# Patient Record
Sex: Male | Born: 1970 | Race: White | Hispanic: No | Marital: Married | Smoking: Current every day smoker
Health system: Southern US, Community
[De-identification: ages and names within clinical notes are randomized; demographics above are authoritative.]

## PROBLEM LIST (undated history)

## (undated) ENCOUNTER — Emergency Department (HOSPITAL_BASED_OUTPATIENT_CLINIC_OR_DEPARTMENT_OTHER)

## (undated) DIAGNOSIS — I509 Heart failure, unspecified: Secondary | ICD-10-CM

## (undated) DIAGNOSIS — K7460 Unspecified cirrhosis of liver: Secondary | ICD-10-CM

## (undated) DIAGNOSIS — E7800 Pure hypercholesterolemia, unspecified: Secondary | ICD-10-CM

## (undated) HISTORY — PX: TONSILLECTOMY: SUR1361

---

## 1998-08-09 ENCOUNTER — Emergency Department (HOSPITAL_COMMUNITY): Admission: EM | Admit: 1998-08-09 | Discharge: 1998-08-09 | Payer: Self-pay | Admitting: Emergency Medicine

## 1999-05-09 ENCOUNTER — Emergency Department (HOSPITAL_COMMUNITY): Admission: EM | Admit: 1999-05-09 | Discharge: 1999-05-09 | Payer: Self-pay | Admitting: Emergency Medicine

## 2001-10-11 ENCOUNTER — Encounter: Payer: Self-pay | Admitting: Family Medicine

## 2001-10-11 ENCOUNTER — Ambulatory Visit (HOSPITAL_COMMUNITY): Admission: RE | Admit: 2001-10-11 | Discharge: 2001-10-11 | Payer: Self-pay | Admitting: Family Medicine

## 2009-11-08 ENCOUNTER — Ambulatory Visit: Payer: Self-pay | Admitting: General Practice

## 2010-12-21 ENCOUNTER — Ambulatory Visit: Payer: Self-pay | Admitting: General Practice

## 2011-08-19 IMAGING — CR DG CHEST 2V
1 series · 2 of 2 positions shown · non-contrast
Comparison: none

REASON FOR EXAM: respiratory clearance physical include report to [REDACTED] fax [phone]
COMMENTS:

PROCEDURE:     DXR - DXR CHEST 2 VIEWS NORIKO NAGEL  - November 08, 2009 [DATE]
RESULT:     The lung fields are clear. No pneumonia, pneumothorax or pleural
effusion is seen. The heart and pulmonary vasculature are normal in
appearance. The mediastinal and osseous structures show no acute changes.

[Series 1: view not recorded · 0.17mm/px · 2 of 2 slices shown]
[im 1/2]
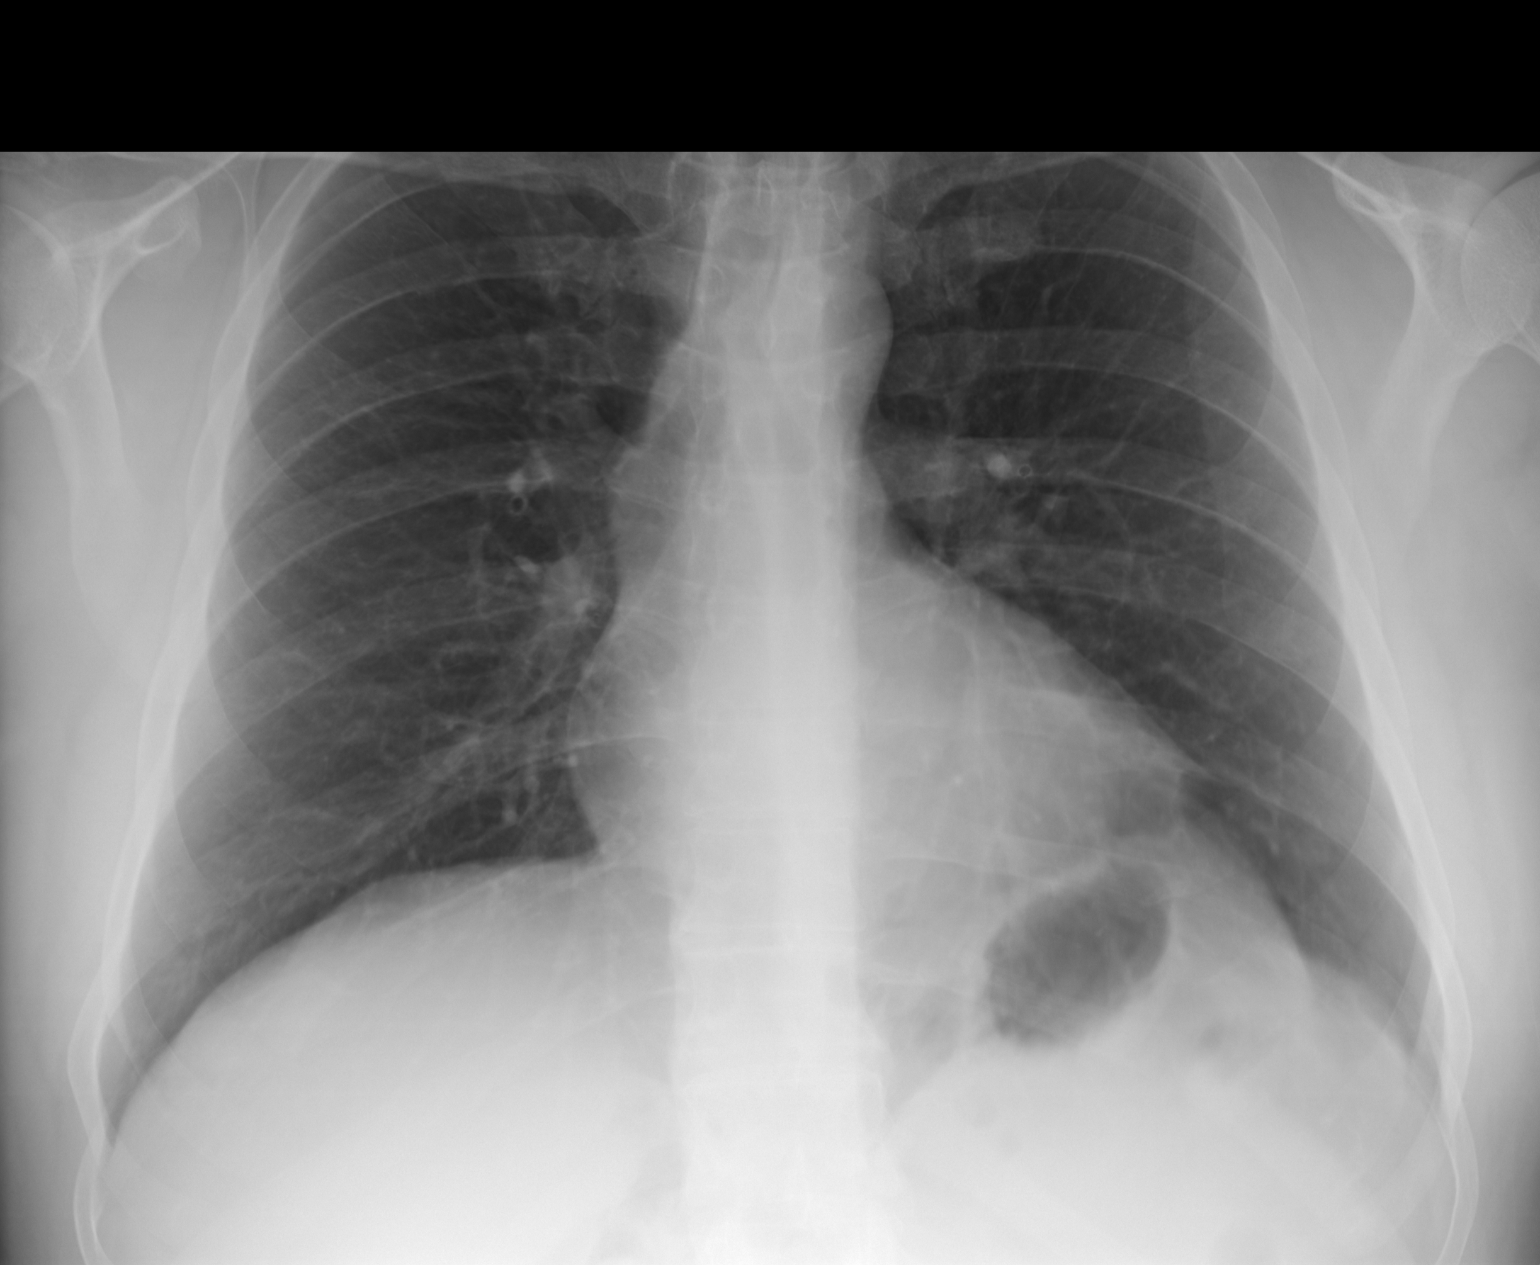
[im 2/2]
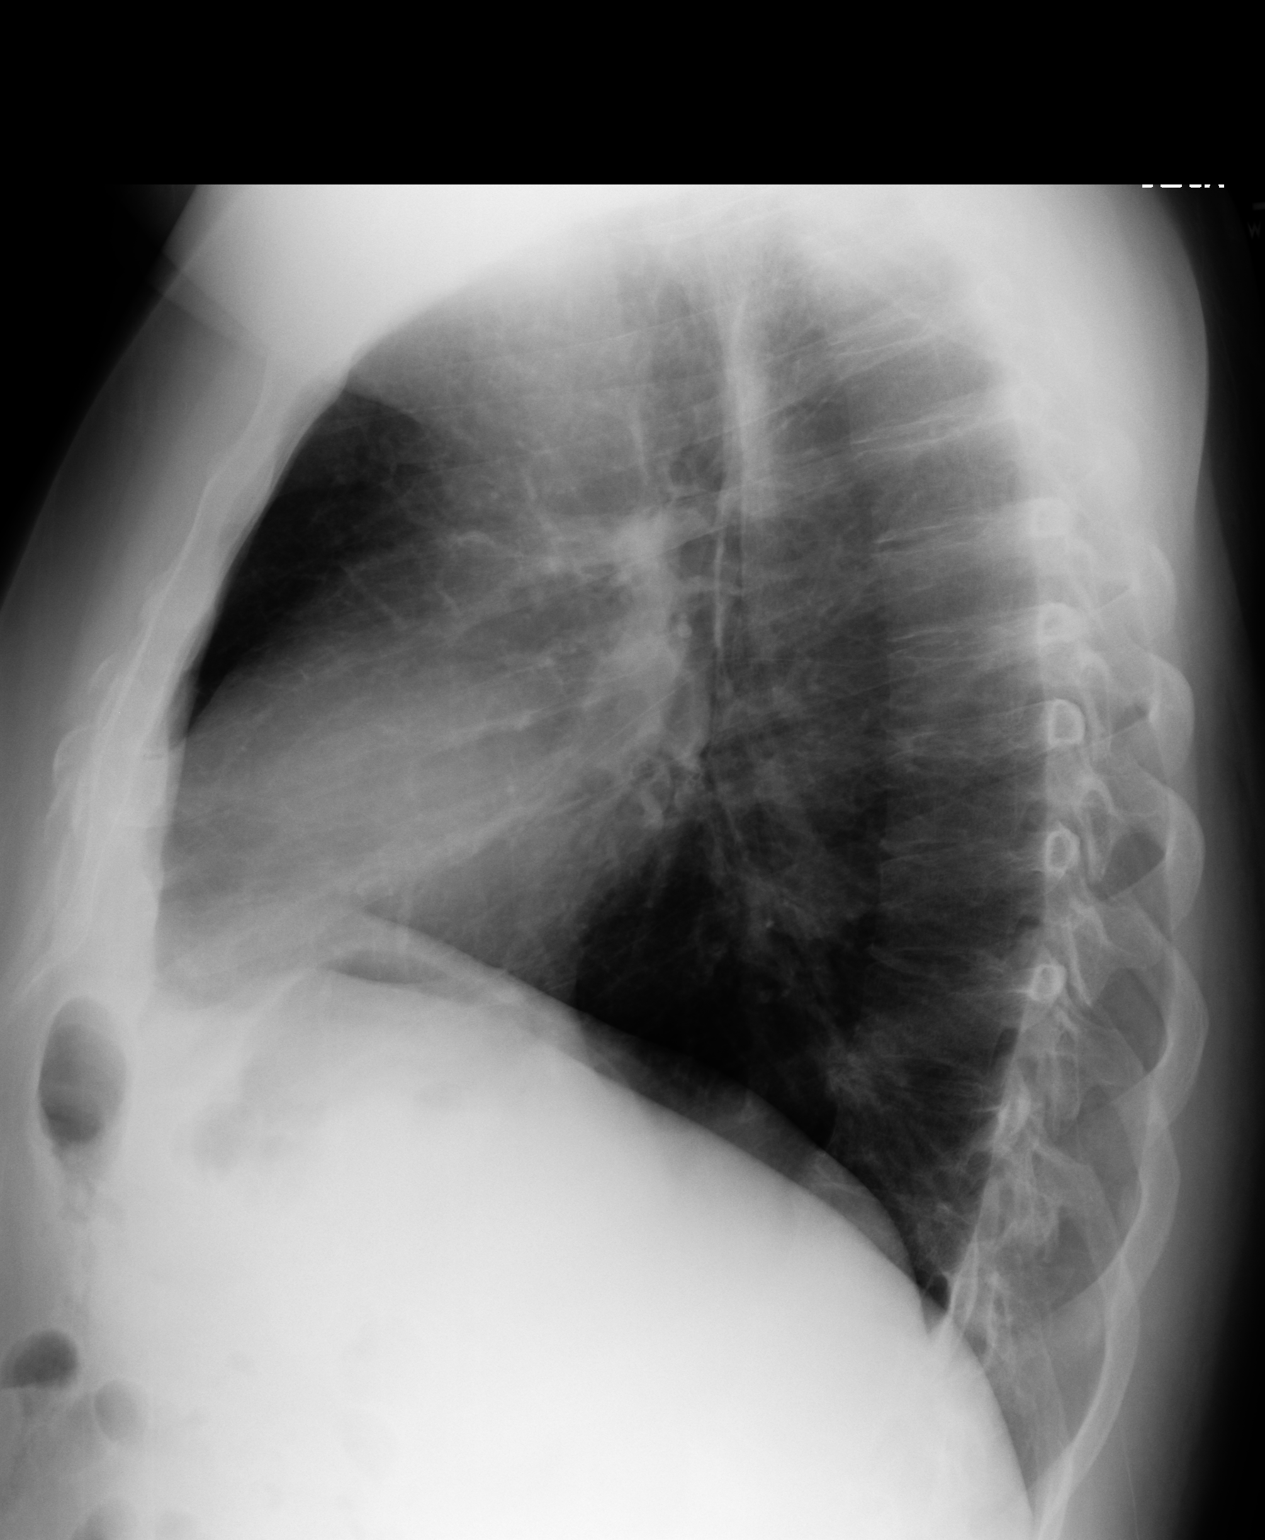

[2 of 2 positions shown; findings below may reference images not displayed]

IMPRESSION: 1.     No significant abnormalities are identified.

## 2014-03-04 ENCOUNTER — Ambulatory Visit: Payer: Self-pay | Admitting: General Practice

## 2021-02-19 ENCOUNTER — Inpatient Hospital Stay (HOSPITAL_COMMUNITY)
Admission: EM | Admit: 2021-02-19 | Discharge: 2021-03-01 | DRG: 896 | Disposition: A | Discharge status: Rehabilitation Facility | Payer: Self-pay | Attending: Internal Medicine | Admitting: Internal Medicine

## 2021-02-19 ENCOUNTER — Inpatient Hospital Stay (HOSPITAL_COMMUNITY): Payer: Self-pay

## 2021-02-19 ENCOUNTER — Other Ambulatory Visit: Payer: Self-pay

## 2021-02-19 ENCOUNTER — Emergency Department (HOSPITAL_COMMUNITY): Payer: Self-pay

## 2021-02-19 ENCOUNTER — Encounter (HOSPITAL_COMMUNITY): Payer: Self-pay

## 2021-02-19 DIAGNOSIS — W19XXXA Unspecified fall, initial encounter: Secondary | ICD-10-CM

## 2021-02-19 DIAGNOSIS — K5909 Other constipation: Secondary | ICD-10-CM | POA: Diagnosis present

## 2021-02-19 DIAGNOSIS — I161 Hypertensive emergency: Secondary | ICD-10-CM | POA: Diagnosis present

## 2021-02-19 DIAGNOSIS — Z6841 Body Mass Index (BMI) 40.0 and over, adult: Secondary | ICD-10-CM

## 2021-02-19 DIAGNOSIS — J9601 Acute respiratory failure with hypoxia: Secondary | ICD-10-CM | POA: Diagnosis present

## 2021-02-19 DIAGNOSIS — I10 Essential (primary) hypertension: Secondary | ICD-10-CM

## 2021-02-19 DIAGNOSIS — F17210 Nicotine dependence, cigarettes, uncomplicated: Secondary | ICD-10-CM | POA: Diagnosis present

## 2021-02-19 DIAGNOSIS — I110 Hypertensive heart disease with heart failure: Secondary | ICD-10-CM | POA: Diagnosis present

## 2021-02-19 DIAGNOSIS — R0602 Shortness of breath: Secondary | ICD-10-CM

## 2021-02-19 DIAGNOSIS — W108XXD Fall (on) (from) other stairs and steps, subsequent encounter: Secondary | ICD-10-CM

## 2021-02-19 DIAGNOSIS — F10231 Alcohol dependence with withdrawal delirium: Principal | ICD-10-CM | POA: Diagnosis present

## 2021-02-19 DIAGNOSIS — E519 Thiamine deficiency, unspecified: Secondary | ICD-10-CM | POA: Diagnosis present

## 2021-02-19 DIAGNOSIS — R296 Repeated falls: Secondary | ICD-10-CM | POA: Diagnosis present

## 2021-02-19 DIAGNOSIS — G589 Mononeuropathy, unspecified: Secondary | ICD-10-CM | POA: Diagnosis present

## 2021-02-19 DIAGNOSIS — K7460 Unspecified cirrhosis of liver: Secondary | ICD-10-CM

## 2021-02-19 DIAGNOSIS — R609 Edema, unspecified: Secondary | ICD-10-CM

## 2021-02-19 DIAGNOSIS — E222 Syndrome of inappropriate secretion of antidiuretic hormone: Secondary | ICD-10-CM | POA: Diagnosis present

## 2021-02-19 DIAGNOSIS — E669 Obesity, unspecified: Secondary | ICD-10-CM | POA: Diagnosis present

## 2021-02-19 DIAGNOSIS — Z8249 Family history of ischemic heart disease and other diseases of the circulatory system: Secondary | ICD-10-CM

## 2021-02-19 DIAGNOSIS — Y92009 Unspecified place in unspecified non-institutional (private) residence as the place of occurrence of the external cause: Secondary | ICD-10-CM

## 2021-02-19 DIAGNOSIS — R778 Other specified abnormalities of plasma proteins: Secondary | ICD-10-CM | POA: Diagnosis present

## 2021-02-19 DIAGNOSIS — D6959 Other secondary thrombocytopenia: Secondary | ICD-10-CM | POA: Diagnosis present

## 2021-02-19 DIAGNOSIS — I5043 Acute on chronic combined systolic (congestive) and diastolic (congestive) heart failure: Secondary | ICD-10-CM | POA: Diagnosis present

## 2021-02-19 DIAGNOSIS — I160 Hypertensive urgency: Secondary | ICD-10-CM | POA: Diagnosis present

## 2021-02-19 DIAGNOSIS — R7989 Other specified abnormal findings of blood chemistry: Secondary | ICD-10-CM | POA: Diagnosis present

## 2021-02-19 DIAGNOSIS — E860 Dehydration: Secondary | ICD-10-CM | POA: Diagnosis present

## 2021-02-19 DIAGNOSIS — G312 Degeneration of nervous system due to alcohol: Secondary | ICD-10-CM | POA: Diagnosis present

## 2021-02-19 DIAGNOSIS — M109 Gout, unspecified: Secondary | ICD-10-CM | POA: Diagnosis present

## 2021-02-19 DIAGNOSIS — K7030 Alcoholic cirrhosis of liver without ascites: Secondary | ICD-10-CM | POA: Diagnosis present

## 2021-02-19 DIAGNOSIS — F419 Anxiety disorder, unspecified: Secondary | ICD-10-CM | POA: Diagnosis present

## 2021-02-19 DIAGNOSIS — Z20822 Contact with and (suspected) exposure to covid-19: Secondary | ICD-10-CM | POA: Diagnosis present

## 2021-02-19 DIAGNOSIS — M25522 Pain in left elbow: Secondary | ICD-10-CM

## 2021-02-19 DIAGNOSIS — E538 Deficiency of other specified B group vitamins: Secondary | ICD-10-CM | POA: Diagnosis present

## 2021-02-19 HISTORY — DX: Nicotine dependence, cigarettes, uncomplicated: F17.210

## 2021-02-19 HISTORY — DX: Essential (primary) hypertension: I10

## 2021-02-19 LAB — VITAMIN B12: Vitamin B-12: 259 pg/mL (ref 180–914)

## 2021-02-19 LAB — CBC
HCT: 43.6 % (ref 39.0–52.0)
Hemoglobin: 14.8 g/dL (ref 13.0–17.0)
MCH: 35.2 pg — ABNORMAL HIGH (ref 26.0–34.0)
MCHC: 33.9 g/dL (ref 30.0–36.0)
MCV: 103.8 fL — ABNORMAL HIGH (ref 80.0–100.0)
Platelets: 146 10*3/uL — ABNORMAL LOW (ref 150–400)
RBC: 4.2 MIL/uL — ABNORMAL LOW (ref 4.22–5.81)
RDW: 12.5 % (ref 11.5–15.5)
WBC: 8.9 10*3/uL (ref 4.0–10.5)
nRBC: 0 % (ref 0.0–0.2)

## 2021-02-19 LAB — BASIC METABOLIC PANEL
Anion gap: 11 (ref 5–15)
BUN: 6 mg/dL (ref 6–20)
CO2: 28 mmol/L (ref 22–32)
Calcium: 9.6 mg/dL (ref 8.9–10.3)
Chloride: 91 mmol/L — ABNORMAL LOW (ref 98–111)
Creatinine, Ser: 0.68 mg/dL (ref 0.61–1.24)
GFR, Estimated: >60 mL/min (ref >60)
Glucose, Bld: 97 mg/dL (ref 70–99)
Potassium: 4.5 mmol/L (ref 3.5–5.1)
Sodium: 130 mmol/L — ABNORMAL LOW (ref 135–145)

## 2021-02-19 LAB — FOLATE: Folate: 5.5 ng/mL — ABNORMAL LOW (ref >5.9)

## 2021-02-19 LAB — TSH: TSH: 3.067 u[IU]/mL (ref 0.350–4.500)

## 2021-02-19 LAB — HEPATIC FUNCTION PANEL
ALT: 29 U/L (ref 0–44)
AST: 27 U/L (ref 15–41)
Albumin: 3.9 g/dL (ref 3.5–5.0)
Alkaline Phosphatase: 52 U/L (ref 38–126)
Bilirubin, Direct: 0.3 mg/dL — ABNORMAL HIGH (ref 0.0–0.2)
Indirect Bilirubin: 0.6 mg/dL (ref 0.3–0.9)
Total Bilirubin: 0.9 mg/dL (ref 0.3–1.2)
Total Protein: 6.4 g/dL — ABNORMAL LOW (ref 6.5–8.1)

## 2021-02-19 LAB — TROPONIN I (HIGH SENSITIVITY)
Troponin I (High Sensitivity): 25 ng/L — ABNORMAL HIGH (ref <18)
Troponin I (High Sensitivity): 22 ng/L — ABNORMAL HIGH (ref <18)

## 2021-02-19 LAB — RESP PANEL BY RT-PCR (FLU A&B, COVID) ARPGX2
Influenza A by PCR: NEGATIVE
Influenza B by PCR: NEGATIVE
SARS Coronavirus 2 by RT PCR: NEGATIVE

## 2021-02-19 LAB — SEDIMENTATION RATE: Sed Rate: 9 mm/hr (ref 0–16)

## 2021-02-19 LAB — PROTIME-INR
INR: 1 (ref 0.8–1.2)
Prothrombin Time: 13.4 seconds (ref 11.4–15.2)

## 2021-02-19 LAB — LACTIC ACID, PLASMA: Lactic Acid, Venous: 0.7 mmol/L (ref 0.5–1.9)

## 2021-02-19 LAB — APTT: aPTT: 28 seconds (ref 24–36)

## 2021-02-19 LAB — AMMONIA: Ammonia: 30 umol/L (ref 9–35)

## 2021-02-19 LAB — BRAIN NATRIURETIC PEPTIDE: B Natriuretic Peptide: 698 pg/mL — ABNORMAL HIGH (ref 0.0–100.0)

## 2021-02-19 MED ORDER — THIAMINE HCL 100 MG/ML IJ SOLN
100.0000 mg | Freq: Every day | INTRAMUSCULAR | Status: DC
  Administered 2021-02-19: 100 mg via INTRAVENOUS
  Filled 2021-02-19: qty 2

## 2021-02-19 MED ORDER — LISINOPRIL 10 MG PO TABS
20.0000 mg | ORAL_TABLET | Freq: Every day | ORAL | Status: DC
  Administered 2021-02-20: 20 mg via ORAL
  Filled 2021-02-19: qty 2

## 2021-02-19 MED ORDER — NICOTINE 21 MG/24HR TD PT24
21.0000 mg | MEDICATED_PATCH | Freq: Every day | TRANSDERMAL | Status: DC
  Administered 2021-02-20 – 2021-03-01 (×10): 21 mg via TRANSDERMAL
  Filled 2021-02-19 (×10): qty 1

## 2021-02-19 MED ORDER — ADULT MULTIVITAMIN W/MINERALS CH
1.0000 | ORAL_TABLET | Freq: Every day | ORAL | Status: DC
  Administered 2021-02-20 – 2021-03-01 (×10): 1 via ORAL
  Filled 2021-02-19 (×10): qty 1

## 2021-02-19 MED ORDER — THIAMINE HCL 100 MG/ML IJ SOLN: 100.0000 mg | Freq: Every day | INTRAMUSCULAR | Status: DC

## 2021-02-19 MED ORDER — PANTOPRAZOLE SODIUM 40 MG PO TBEC
40.0000 mg | DELAYED_RELEASE_TABLET | Freq: Every day | ORAL | Status: DC
  Administered 2021-02-20 – 2021-03-01 (×10): 40 mg via ORAL
  Filled 2021-02-19 (×10): qty 1

## 2021-02-19 MED ORDER — MORPHINE SULFATE (PF) 4 MG/ML IV SOLN
6.0000 mg | Freq: Once | INTRAVENOUS | Status: AC
  Administered 2021-02-19: 6 mg via INTRAVENOUS
  Filled 2021-02-19: qty 2

## 2021-02-19 MED ORDER — LORAZEPAM 2 MG/ML IJ SOLN: 2.0000 mg | Freq: Once | INTRAMUSCULAR | Status: AC

## 2021-02-19 MED ORDER — LORAZEPAM 2 MG/ML IJ SOLN
INTRAMUSCULAR | Status: AC
  Administered 2021-02-19: 2 mg via INTRAVENOUS
  Filled 2021-02-19: qty 1

## 2021-02-19 MED ORDER — LORAZEPAM 2 MG/ML IJ SOLN
1.0000 mg | INTRAMUSCULAR | Status: AC | PRN
  Administered 2021-02-20 (×2): 2 mg via INTRAVENOUS
  Administered 2021-02-20: 3 mg via INTRAVENOUS
  Administered 2021-02-20: 2 mg via INTRAVENOUS
  Administered 2021-02-20: 4 mg via INTRAVENOUS
  Administered 2021-02-21: 1 mg via INTRAVENOUS
  Administered 2021-02-21 – 2021-02-22 (×4): 2 mg via INTRAVENOUS
  Filled 2021-02-19 (×3): qty 1
  Filled 2021-02-19: qty 2
  Filled 2021-02-19 (×4): qty 1

## 2021-02-19 MED ORDER — ENOXAPARIN SODIUM 40 MG/0.4ML IJ SOSY
40.0000 mg | PREFILLED_SYRINGE | INTRAMUSCULAR | Status: DC
  Administered 2021-02-19 – 2021-02-20 (×2): 40 mg via SUBCUTANEOUS
  Filled 2021-02-19 (×2): qty 0.4

## 2021-02-19 MED ORDER — ONDANSETRON HCL 4 MG PO TABS: 4.0000 mg | ORAL_TABLET | Freq: Four times a day (QID) | ORAL | Status: DC | PRN

## 2021-02-19 MED ORDER — FUROSEMIDE 10 MG/ML IJ SOLN
40.0000 mg | Freq: Two times a day (BID) | INTRAMUSCULAR | Status: DC
  Administered 2021-02-19 – 2021-02-21 (×4): 40 mg via INTRAVENOUS
  Filled 2021-02-19 (×4): qty 4

## 2021-02-19 MED ORDER — FUROSEMIDE 10 MG/ML IJ SOLN
40.0000 mg | Freq: Once | INTRAMUSCULAR | Status: AC
  Administered 2021-02-19: 40 mg via INTRAVENOUS
  Filled 2021-02-19: qty 4

## 2021-02-19 MED ORDER — THIAMINE HCL 100 MG PO TABS: 100.0000 mg | ORAL_TABLET | Freq: Every day | ORAL | Status: DC

## 2021-02-19 MED ORDER — LORAZEPAM 2 MG/ML IJ SOLN
2.0000 mg | Freq: Once | INTRAMUSCULAR | Status: AC
  Administered 2021-02-19: 2 mg via INTRAVENOUS
  Filled 2021-02-19: qty 1

## 2021-02-19 MED ORDER — LORAZEPAM 2 MG/ML IJ SOLN
0.0000 mg | Freq: Two times a day (BID) | INTRAMUSCULAR | Status: AC
  Administered 2021-02-21 – 2021-02-22 (×2): 2 mg via INTRAVENOUS
  Administered 2021-02-22: 3 mg via INTRAVENOUS
  Administered 2021-02-23: 2 mg via INTRAVENOUS
  Filled 2021-02-19 (×5): qty 1

## 2021-02-19 MED ORDER — AMLODIPINE BESYLATE 5 MG PO TABS
5.0000 mg | ORAL_TABLET | Freq: Every day | ORAL | Status: DC
  Filled 2021-02-19: qty 1

## 2021-02-19 MED ORDER — FOLIC ACID 1 MG PO TABS
1.0000 mg | ORAL_TABLET | Freq: Every day | ORAL | Status: DC
  Administered 2021-02-20 – 2021-03-01 (×10): 1 mg via ORAL
  Filled 2021-02-19 (×9): qty 1

## 2021-02-19 MED ORDER — LORAZEPAM 1 MG PO TABS: 1.0000 mg | ORAL_TABLET | ORAL | Status: AC | PRN

## 2021-02-19 MED ORDER — PROPRANOLOL HCL 20 MG PO TABS
20.0000 mg | ORAL_TABLET | Freq: Two times a day (BID) | ORAL | Status: DC
  Administered 2021-02-20: 20 mg via ORAL
  Filled 2021-02-19 (×2): qty 1

## 2021-02-19 MED ORDER — ACETAMINOPHEN 650 MG RE SUPP: 650.0000 mg | Freq: Four times a day (QID) | RECTAL | Status: DC | PRN

## 2021-02-19 MED ORDER — OMEPRAZOLE MAGNESIUM 20 MG PO TBEC: 20.0000 mg | DELAYED_RELEASE_TABLET | Freq: Every morning | ORAL | Status: DC

## 2021-02-19 MED ORDER — OXYCODONE-ACETAMINOPHEN 5-325 MG PO TABS
1.0000 | ORAL_TABLET | Freq: Once | ORAL | Status: AC
  Administered 2021-02-19: 1 via ORAL
  Filled 2021-02-19: qty 1

## 2021-02-19 MED ORDER — VITAMIN B-12 1000 MCG PO TABS
2000.0000 ug | ORAL_TABLET | Freq: Every day | ORAL | Status: DC
  Administered 2021-02-20: 2000 ug via ORAL
  Filled 2021-02-19: qty 2

## 2021-02-19 MED ORDER — THIAMINE HCL 100 MG/ML IJ SOLN
500.0000 mg | Freq: Three times a day (TID) | INTRAVENOUS | Status: DC
  Administered 2021-02-19: 500 mg via INTRAVENOUS
  Filled 2021-02-19 (×4): qty 5

## 2021-02-19 MED ORDER — LORAZEPAM 2 MG/ML IJ SOLN
0.0000 mg | Freq: Four times a day (QID) | INTRAMUSCULAR | Status: AC
  Administered 2021-02-19 – 2021-02-20 (×3): 2 mg via INTRAVENOUS
  Administered 2021-02-21: 1 mg via INTRAVENOUS
  Administered 2021-02-21: 2 mg via INTRAVENOUS
  Administered 2021-02-21: 1 mg via INTRAVENOUS
  Filled 2021-02-19: qty 1
  Filled 2021-02-19: qty 2
  Filled 2021-02-19 (×7): qty 1

## 2021-02-19 MED ORDER — ONDANSETRON HCL 4 MG/2ML IJ SOLN: 4.0000 mg | Freq: Four times a day (QID) | INTRAMUSCULAR | Status: DC | PRN

## 2021-02-19 MED ORDER — THIAMINE HCL 100 MG PO TABS
100.0000 mg | ORAL_TABLET | Freq: Every day | ORAL | Status: DC
Start: 2021-02-24 — End: 2021-02-23

## 2021-02-19 MED ORDER — POLYETHYLENE GLYCOL 3350 17 G PO PACK
17.0000 g | PACK | Freq: Every day | ORAL | Status: DC | PRN
  Administered 2021-02-23: 17 g via ORAL
  Filled 2021-02-19: qty 1

## 2021-02-19 MED ORDER — ACETAMINOPHEN 325 MG PO TABS
650.0000 mg | ORAL_TABLET | Freq: Four times a day (QID) | ORAL | Status: DC | PRN
  Administered 2021-02-21 – 2021-03-01 (×11): 650 mg via ORAL
  Filled 2021-02-19 (×11): qty 2

## 2021-02-19 NOTE — ED Provider Notes (Signed)
[facility] EMERGENCY DEPARTMENT Provider Note   CSN: [id number] Arrival date & time: 02/19/21  1242     History Chief Complaint  Patient presents with   Shortness of Breath    Colin Stevens is a 50 y.o. male.  Patient is a 50 year old male who presents with general weakness and dizziness.  He denies any past medical history.  He is a smoker.  He said about 2 to 3 weeks ago he started having some pain in his mid to lower back.  He does not report any radiation down his legs but he does have some twitching in his legs at times and feels like they are definitely weaker than normal along with some numbness in both of his legs.  During that same time he had a little bit of nasal congestion which she thought may be allergies.  He has a little bit of a cough.  He has had some progressive symptoms over the last 2 weeks with weakness, primarily in his legs.  He has had a couple falls related this.  He also reports some dizziness.  He denies any injuries from the fall.  He denies any injuries that led to the initial back pain.  He fell 2 days ago and EMS had to be called to help get him back into his bed because he was weak.  He was noted to have a lower oxygen saturation at that point but refused transport.  Today he was noted to have a room air saturation of 80% and was given nebulizer treatments as well as Solu-Medrol.  He does not report any prior lung issues.  He does not use inhalers.  He has had some swelling to his legs.  No associated chest pain.  He has had a little bit of a dry cough.  His wife showed me a doorbell camera video of him recently when he walked down a couple porch steps and when he got to the bottom he had but it looks like dyskinesia where he was twitching all over and he had to stop for a minute then it resolved and he was able to start walking.  He also reports a worsening tremor over the last 2 weeks.  He does drink about 6 beers a day.  He has been cutting  back and currently is drinking about 2 beers a although he says that the symptoms started while he was still drinking more heavily.      History reviewed. No pertinent past medical history.  There are no problems to display for this patient.   History reviewed. No pertinent surgical history.     History reviewed. No pertinent family history.     Home Medications Prior to Admission medications   Not on File    Allergies    Patient has no known allergies.  Review of Systems   Review of Systems  Constitutional:  Positive for fatigue. Negative for chills, diaphoresis and fever.  HENT:  Negative for congestion, rhinorrhea and sneezing.   Eyes: Negative.   Respiratory:  Positive for cough, shortness of breath and wheezing. Negative for chest tightness.   Cardiovascular:  Negative for chest pain and leg swelling.  Gastrointestinal:  Negative for abdominal pain, blood in stool, diarrhea, nausea and vomiting.  Genitourinary:  Negative for difficulty urinating, flank pain, frequency and hematuria.  Musculoskeletal:  Positive for back pain. Negative for arthralgias.  Skin:  Negative for rash.  Neurological:  Positive for tremors and weakness. Negative  for dizziness, speech difficulty, numbness and headaches.   Physical Exam Updated Vital Signs BP (!) 151/101   Pulse (!) 101   Temp 97.7 F (36.5 C) (Oral)   Resp (!) 21   SpO2 92%   Physical Exam Constitutional:      Appearance: He is well-developed.  HENT:     Head: Normocephalic and atraumatic.  Eyes:     Pupils: Pupils are equal, round, and reactive to light.  Neck:     Comments: No pain along the cervical spine.  There is some tenderness to the mid thoracic spine through the upper lumbosacral spine.  No step-offs or deformities. Cardiovascular:     Rate and Rhythm: Normal rate and regular rhythm.     Heart sounds: Normal heart sounds.  Pulmonary:     Effort: Pulmonary effort is normal. Tachypnea present. No  respiratory distress.     Breath sounds: Wheezing (Scarce wheezing) present. No rales.  Chest:     Chest wall: No tenderness.  Abdominal:     General: Bowel sounds are normal.     Palpations: Abdomen is soft.     Tenderness: There is no abdominal tenderness. There is no guarding or rebound.  Musculoskeletal:        General: Normal range of motion.     Cervical back: Normal range of motion and neck supple.     Comments: 1+ pitting edema bilaterally  Lymphadenopathy:     Cervical: No cervical adenopathy.  Skin:    General: Skin is warm and dry.     Findings: No rash.  Neurological:     Mental Status: He is alert and oriented to person, place, and time.     Comments: Motor 5 out of 5 all extremities, sensation grossly intact to light touch all extremities.  No numbness present.  He does have a tremor to his upper extremities bilaterally    ED Results / Procedures / Treatments   Labs (all labs ordered are listed, but only abnormal results are displayed) Labs Reviewed  BASIC METABOLIC PANEL - Abnormal; Notable for the following components:      Result Value   Sodium 130 (*)    Chloride 91 (*)    All other components within normal limits  CBC - Abnormal; Notable for the following components:   RBC 4.20 (*)    MCV 103.8 (*)    MCH 35.2 (*)    Platelets 146 (*)    All other components within normal limits  TROPONIN I (HIGH SENSITIVITY) - Abnormal; Notable for the following components:   Troponin I (High Sensitivity) 25 (*)    All other components within normal limits  RESP PANEL BY RT-PCR (FLU A&B, COVID) ARPGX2  LACTIC ACID, PLASMA  HEPATIC FUNCTION PANEL  URINALYSIS, ROUTINE W REFLEX MICROSCOPIC  BRAIN NATRIURETIC PEPTIDE  TROPONIN I (HIGH SENSITIVITY)    EKG EKG Interpretation  Date/Time:  Saturday February 19 2021 12:52:46 EDT Ventricular Rate:  94 PR Interval:  178 QRS Duration: 88 QT Interval:  351 QTC Calculation: 439 R Axis:   64 Text Interpretation: Sinus  rhythm Multiple ventricular premature complexes Probable left atrial enlargement No old tracing to compare Confirmed by Rolan Bucco [phone]) on 02/19/2021 1:49:01 PM  Radiology DG Chest 2 View  Result Date: 02/19/2021 CLINICAL DATA:  50 year old male with shortness of breath. EXAM: CHEST - 2 VIEW COMPARISON:  03/04/2014 FINDINGS: The mediastinal contours are within normal limits. Mild cardiomegaly. Low lung volumes. My cephalization of pulmonary vasculature. Mild  diffuse hazy pulmonary opacities with a bibasilar predominance. No pneumothorax or evidence of significant pleural effusion. No acute osseous abnormality. IMPRESSION: Mild pulmonary edema. Electronically Signed   By: Marliss Coots M.D.   On: 02/19/2021 13:59   CT Head Wo Contrast  Result Date: 02/19/2021 CLINICAL DATA:  Neuro deficit, acute, stroke suspected EXAM: CT HEAD WITHOUT CONTRAST TECHNIQUE: Contiguous axial images were obtained from the base of the skull through the vertex without intravenous contrast. COMPARISON:  None. FINDINGS: Brain: No evidence of acute infarction, hemorrhage, hydrocephalus, extra-axial collection or mass lesion/mass effect. Vascular: No hyperdense vessel or unexpected calcification. Skull: Normal. Negative for fracture or focal lesion. Sinuses/Orbits: Mild mucosal thickening of the LEFT greater than RIGHT maxillary sinuses. Other: None. IMPRESSION: No acute intracranial abnormality. Electronically Signed   By: Meda Klinefelter M.D.   On: 02/19/2021 14:57    Procedures Procedures   Medications Ordered in ED Medications  furosemide (LASIX) injection 40 mg (has no administration in time range)  morphine 4 MG/ML injection 6 mg (has no administration in time range)    ED Course  I have reviewed the triage vital signs and the nursing notes.  Pertinent labs & imaging results that were available during my care of the patient were reviewed by me and considered in my medical decision making (see chart for  details).    MDM Rules/Calculators/A&P                           Patient is a 50 year old male who presents with a constellation of symptoms.  He has a worsening tremor and lower extremity weakness with worsening back pain.  He also had some visualized dyskinesia.  He has some shortness of breath and on imaging appears to have some pulmonary edema.  He has a mild oxygen requirement and is on nasal cannula 2 L.  We will give some Lasix.  He is a heavy drinker.  However does not appear to be more withdrawal type symptoms.  Will give thiamine and check thiamine level.  His electrolytes look okay.  Head CT is nonconcerning.  I did discuss the patient with Dr. Otelia Limes.  We will go ahead and get imaging of his brain and lumbar/thoracic spine with MRIs.  We will consult neurology back once these images are obtained.  Dr. Charm Barges to take over care pending these images.  Patient will likely need admission following this. Final Clinical Impression(s) / ED Diagnoses Final diagnoses:  None    Rx / DC Orders ED Discharge Orders     None        Rolan Bucco, MD 02/19/21 [phone]

## 2021-02-19 NOTE — ED Provider Notes (Signed)
Signout from Dr. Tamera Punt.  50 year old male complaining of weakness frequent falls.  Started with mid lower back pain.  Weakness in the legs.  EMS found with low saturations and given oxygen Solu-Medrol.  Plan is to follow-up on MRI imaging and reconsult neurology when resulted.  Will need admission to the hospital for further work-up. Physical Exam  BP (!) 151/101   Pulse (!) 101   Temp 97.7 F (36.5 C) (Oral)   Resp (!) 21   SpO2 92%   Physical Exam  ED Course/Procedures     Procedures  MDM  MRI not showing any significant pathology to explain patient's symptoms.  Discussed with Dr. Theda Sers from neurology.  He will consult on the patient.  Asked if we can get a B12 and ESR.  Hospitalist paged for admission.  Discussed with Dr. Cyd Silence [facility] hospitalist who will evaluate the patient for admission.       Hayden Rasmussen, MD 02/20/21 [phone]

## 2021-02-19 NOTE — ED Notes (Signed)
Neurology at bedside.

## 2021-02-19 NOTE — Assessment & Plan Note (Signed)
   Counseling on cessation  Patient requesting nicotine replacement therapy, a patch has been ordered

## 2021-02-19 NOTE — ED Notes (Signed)
Patient transported to Ultrasound 

## 2021-02-19 NOTE — Assessment & Plan Note (Addendum)
   Patient experiencing progressively worsening unsteady gait with bouts of falls at home in the past several weeks  Work-up in the emergency department has not identified a cause thus far with no evidence of acute disease on MRI brain or MRI of thoracic and lumbar spine.  Possibly related to alcohol withdrawal or possibly secondary to severe thiamine or B12 deficiency  Obtaining ammonia, vitamin B1, vitamin B12 levels  Neurology has been consulted by the emergency department staff, appreciate their input on the issue.

## 2021-02-19 NOTE — Consult Note (Signed)
Neurology Consultation  Reason for Consult: Generalized weakness, unsteady gait, and dizziness Referring Physician: Dr. Tamera Punt  CC: Gait unsteadiness and anxiety  History is obtained from: Chart review, patient and wife  HPI: Colin Stevens is a 50 y.o. male with a medical history significant for tobacco use and ETOH use about six 18 oz beers per day who presented to the ED 10/22 for evaluation of generalized weakness, unsteady gait, and dizziness. Patient states that he first noted pain in his mid-to-lower back approximately 2-3 weeks ago with intermittent leg twitching, weakness, and numbness in bilateral lower extremities that have progressed over the past 2 weeks with some associated dizziness, worsening tremor, and subsequent falls. Of note, EMS was activated yesterday after patient fell because he was unable to get himself up due to significant weakness. On arrival to the ED, he was found to be hypoxic with a room air SpO2 of 80% with lower extremity edema. Patient's wife shared a video with the EDP where the patient appeared to have dyskinesia and twitching all over while walking down a few stairs that resolved with rest. Patient also noted that near the timing of his lower extremity weakness onset, he had some nasal congestion and a cough but attributed these symptoms to allergies and endorses cutting back from 6 beers daily to about 2 beers daily after his lower extremity symptom onset.   The patient and wife endorse mentation that has declined over the past 3 weeks, prior to his decreasing his EtOH intake on Thursday. He had his last day of 6 beers on Wednesday, then drank only 2 beers on Thursday, 2 beers on Friday and had none today. He decreased his EtOH intake due to feeling unwell. His anxiety and tremors have gradually increased since Thursday.    ROS: Severe back pain exacerbated by specific movements and positioning. Occasional shooting paresthesias down back of thighs and legs  stopping at ankles bilaterally. Other ROS as per HPI.    Past Medical History:  Diagnosis Date   Essential hypertension 02/19/2021   Nicotine dependence, cigarettes, uncomplicated 35/45/6256   History reviewed. No pertinent surgical history.    Social History:  Patient Is a current tobacco smoker Drinks about six 18 oz beers daily with decrease to about 2 beers daily approximately 2 days ago  Medications  Current Facility-Administered Medications:    thiamine (B-1) injection 100 mg, 100 mg, Intravenous, Daily, Belfi, Melanie, MD, 100 mg at 02/19/21 1608  Current Outpatient Medications:    lisinopril (ZESTRIL) 20 MG tablet, Take 20 mg by mouth daily., Disp: , Rfl:   Exam: Current vital signs: BP (!) 159/101   Pulse 99   Temp 97.7 F (36.5 C) (Oral)   Resp 17   SpO2 94%  Vital signs in last 24 hours: Temp:  [97.7 F (36.5 C)] 97.7 F (36.5 C) (10/22 1248) Pulse Rate:  [92-101] 99 (10/22 1630) Resp:  [17-32] 17 (10/22 1630) BP: (139-160)/(95-121) 159/101 (10/22 1630) SpO2:  [91 %-97 %] 94 % (10/22 1630)  HEENT: Flushed skin to face and scalp.  Lungs: Respirations unlabored Abdomen: Significant adiposity  NEURO:  Mental Status: Awake and alert. Fully oriented. Speech fluent without evidence of aphasia.  Able to follow all commands without difficulty. Anxious affect.  Cranial Nerves: II:  Temporal visual fields intact with no extinction to DSS. PERRL III,IV, VI: No ptosis. EOMI. No nystagmus V,VII: smile symmetric, facial temp sensation equal bilaterally VIII: hearing intact to voice IX,X: No hypophonia XI: Symmetric XII: Midline  tongue extension  Motor: Right : Upper extremity   5/5    Left:     Upper extremity   5/5  Lower extremity   5/5     Lower extremity   5/5 Prominent coarse tremors to bilateral hands/wrists when arms are outstretched.  No rest tremor.  Occasional, single low amplitude jerking movements of legs and arms are nonsynchronous and  non-rhythmic Sensory: Temp and light touch intact proximally x 4. Allodynia to feet bilaterally. No extinction to DSS.  Deep Tendon Reflexes:  1+ bilateral brachioradialis.  2+ bilateral patellae, 1+ bilateral achilles.  Toes downgoing bilaterally Cerebellar: Subtle dysmetria with FNF and H-S bilaterally Gait: Patient states that he will fall if he ambulates   Labs I have reviewed labs in epic and the results pertinent to this consultation are: CBC    Component Value Date/Time   WBC 8.9 02/19/2021 1256   RBC 4.20 (L) 02/19/2021 1256   HGB 14.8 02/19/2021 1256   HCT 43.6 02/19/2021 1256   PLT 146 (L) 02/19/2021 1256   MCV 103.8 (H) 02/19/2021 1256   MCH 35.2 (H) 02/19/2021 1256   MCHC 33.9 02/19/2021 1256   RDW 12.5 02/19/2021 1256   CMP     Component Value Date/Time   NA 130 (L) 02/19/2021 1256   K 4.5 02/19/2021 1256   CL 91 (L) 02/19/2021 1256   CO2 28 02/19/2021 1256   GLUCOSE 97 02/19/2021 1256   BUN 6 02/19/2021 1256   CREATININE 0.68 02/19/2021 1256   CALCIUM 9.6 02/19/2021 1256   PROT 6.4 (L) 02/19/2021 1327   ALBUMIN 3.9 02/19/2021 1327   AST 27 02/19/2021 1327   ALT 29 02/19/2021 1327   ALKPHOS 52 02/19/2021 1327   BILITOT 0.9 02/19/2021 1327   GFRNONAA >60 02/19/2021 1256   Lipid Panel  No results found for: CHOL, TRIG, HDL, CHOLHDL, VLDL, LDLCALC, LDLDIRECT No results found for: HGBA1C Drugs of Abuse  No results found for: LABOPIA, COCAINSCRNUR, LABBENZ, AMPHETMU, THCU, LABBARB  Urinalysis No results found for: COLORURINE, APPEARANCEUR, LABSPEC, PHURINE, GLUCOSEU, HGBUR, BILIRUBINUR, KETONESUR, PROTEINUR, UROBILINOGEN, NITRITE, LEUKOCYTESUR  Imaging I have reviewed the images obtained:  CT-scan of the brain 02/19/2021: No acute intracranial abnormality.  MRI examination of the brain 02/19/2021: Normal.   MRI lumbar spine wo contrast 02/19/2021:  - Mild non-compressive disc bulges at L1-2, L2-3 and L3-4. - L4-5 disc bulge more prominent towards  the left. Mild facet and ligamentous hypertrophy. Mild stenosis of the left lateral recess at this level that could possibly cause compression of the left L5 nerve.   MRI lthoracic spine wo contrast 02/19/2021 Normal MRI of the thoracic spine. No cord abnormality. No abnormality seen to explain regional pain.  Assessment: 50 y.o. male who presented to the ED 10/22 for evaluation of 2-3 weeks of progressive bilateral lower extremity weakness, bilateral lower extremity sensory deficit, dizziness, unsteady gait, and subsequent falls at home with progressive tremoring over 2 weeks and mid to lower back tenderness. Patient fell yesterday and required EMS to help him up due to progressive weakness, however, patient refused transport at that time.  - Examination reveals coarse action tremor, anxiety, tachycardia, flushing, elevated BP and mild ataxia.  - CTH was without acute intracranial abnormality. - MRI imaging reveals no acute abnormality - Overall presentation regarding ambulatory difficulty and the above exam findings is most consistent with acute alcohol withdrawal syndrome with possible component of subacute thiamine deficiency ongoing for the prior 3 weeks in the context of high  alcohol intake.  - ESR is normal - B12 level is low by neurological standards   Recommendations: - 2 mg IV Ativan administered after exam - Start CIWA protocol. May need a scheduled benzodiazepine regimen as well, followed by taper - Start high-dose thiamine protocol: 500 mg IV TID x 3 days, then continue 100 mg po qd thereafter  - Frequent neuro checks - Falls precautions - IVF - Vitamin E level - Serum copper level - Ammonia level, TSH, RPR.  - B12 supplementation at 2000 mcg po qd. Continue at discharge and obtain repeat level in 1 month.   I have seen and examined the patient. I have formulated the assessment and recommendations. Portions of the HPI were documented by the Neurohospitalist NP.   Electronically signed: Dr. Kerney Elbe

## 2021-02-19 NOTE — Assessment & Plan Note (Addendum)
   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  Patient has been initiated on Lasix 40 mg IV every 12 hours Strict input and output monitoring Supplemental oxygen for associated hypoxia ABG has been ordered and is pending. Monitoring renal function and electrolytes with serial chemistires  Daily weights  Echocardiogram in the morning

## 2021-02-19 NOTE — Assessment & Plan Note (Signed)
   Markedly elevated blood pressures throughout the emergency department stay with worsening hypertension  Possibly secondary to alcohol withdrawal and therefore alcohol withdrawal will be treated aggressively with benzodiazepines  Additionally restarting patient's home regimen of oral and hypertensives including amlodipine, lisinopril and propranolol  As needed intravenous and hypertensives for markedly elevated blood pressure

## 2021-02-19 NOTE — H&P (Signed)
History and Physical    Colin Stevens WNU:[id number] DOB: [birth year] DOA: 02/19/2021  PCP: Clayborn Heron, MD  Patient coming from: Home   Chief Complaint:  Chief Complaint  Patient presents with   Shortness of Breath     HPI:    50 year old male with past medical history of hypertension, nicotine dependence, alcohol abuse who presents to [facility] emergency department via EMS with shortness of breath.  Patient is an extremely poor historian due to encephalopathy and the majority of the history has been obtained from the wife who is at the bedside.  Wife explains that approximately 3 weeks ago patient began to develop shortness of breath shortness of breath was initially mild in intensity but progressively became more and more severe.  Shortness of breath is worse with exertion and improved with rest.  At approximately the same time patient developed associated bilateral lower extremity swelling, increasing abdominal girth, paroxysmal nocturnal dyspnea and pillow orthopnea.  Patient additionally complains of cough productive with white frothy sputum.  Patient denies associated chest pain, fever, sick contacts, travel or contact with known COVID-19 infection.  Wife explains that patient has a longstanding history of heavy alcohol use.  Patient typically drinks at least 622 ounce beers as well as one "Four Loco" alcoholic beverage daily.  In the past 3 to 4 weeks as patient's shortness of breath and other symptoms worsened patient has not been able to drink as much, cutting back to 1-2 drinks daily.    After cutting back on his alcohol consumption due to his shortness of breath he also noticed that he was having increasing tremors and generalized weakness, worst in his legs.  This is resulted in loss of balance as well as multiple falls.  Wife denies any recent slurred speech, facial droop or focal weakness.  Wife does endorse some associated waxing and waning confusion over  the span of time as well with the patient typically "saying words that do not make any sense."  Due to patient's worsening shortness of breath EMS was contacted and the patient was eventually brought into [facility] emergency department for evaluation.  Upon evaluation in the emergency department patient was found to be hypoxic and placed on supplemental oxygen via nasal cannula.  Chest x-ray revealed pulmonary edema and therefore patient was administered 40 mg of intravenous Lasix.  Patient was identified to have an elevated BNP of 698 with slightly elevated troponins of 22 and 25.  MRI brain revealed no evidence of acute disease.  MRI of thoracolumbar spine was performed as well revealing no evidence of significant cord compression.  Neurology was consulted and plans on seeing the patient this evening.  The hospitalist group has now been called to assess the patient for admission to the hospital.   Review of Systems:   Review of Systems  Unable to perform ROS: Mental status change   Past Medical History:  Diagnosis Date   Essential hypertension 02/19/2021   Nicotine dependence, cigarettes, uncomplicated 02/19/2021    History reviewed. No pertinent surgical history.   reports that he has been smoking cigarettes. He has been smoking an average of 1 pack per day. He has never used smokeless tobacco. He reports current alcohol use of about 77.0 standard drinks per week. He reports that he does not use drugs.  Allergies  Allergen Reactions   Aleve [Naproxen] Rash    Family History  Problem Relation Age of Onset   Heart disease Father  Prior to Admission medications   Medication Sig Start Date End Date Taking? Authorizing Provider  amLODipine (NORVASC) 5 MG tablet Take 5 mg by mouth at bedtime.   Yes [provider]  propranolol (INDERAL) 20 MG tablet Take 20 mg by mouth daily.   Yes [provider]  lisinopril (ZESTRIL) 20 MG tablet Take 20 mg by mouth  daily. 01/31/21   [provider]    Physical Exam: Vitals:   02/19/21 1630 02/19/21 1848 02/19/21 1900 02/19/21 1930  BP: (!) 159/101 (!) 171/106 (!) 170/99 (!) 168/94  Pulse: 99 (!) 104 (!) 108 (!) 110  Resp: 17 (!) 22 19 18   Temp:      TempSrc:      SpO2: 94% 91% 94% 92%    Constitutional: Lethargic but arousable with ongoing tremor at rest, oriented x2, patient is in mild respiratory distress.  Patient is obese. Skin: Flushed appearance of the skin with petechiae noted over the anterior abdominal wall.  Otherwise, good skin turgor noted. Eyes: Pupils are equally reactive to light.  No evidence of scleral icterus or conjunctival pallor.  ENMT: Moist mucous membranes noted.  Posterior pharynx clear of any exudate or lesions.   Neck: normal, supple, no masses, no thyromegaly.  No evidence of jugular venous distension.   Respiratory: Notable rales in the bilateral bases.  Occasional expiratory wheezing noted.  Increased respiratory effort with respiratory distress but no evidence of accessory muscle use.   Cardiovascular: Tachycardic rate with regular rhythm, no murmurs / rubs / gallops. extensive bilateral lower extremity pitting edema that tracks from the feet all the way up to the mid thighs. 2+ pedal pulses. No carotid bruits.  Chest:   Nontender without crepitus or deformity.   Back:   Nontender without crepitus or deformity. Abdomen: Abdomen is protuberant but Soft and nontender.  No evidence of intra-abdominal masses.  Positive bowel sounds noted in all quadrants.   Musculoskeletal: No joint deformity upper and lower extremities. Good ROM, no contractures. Normal muscle tone.  Neurologic: CN 2-12 grossly intact. Sensation intact.  Patient moving all 4 extremities spontaneously.  Patient is following all commands.  Patient is responsive to verbal stimuli.   Psychiatric: Patient exhibits normal mood with appropriate affect.  Patient seems to possess insight as to their current  situation.     Labs on Admission: I have personally reviewed following labs and imaging studies -   CBC: Recent Labs  Lab 02/19/21 1256  WBC 8.9  HGB 14.8  HCT 43.6  MCV 103.8*  PLT 146*   Basic Metabolic Panel: Recent Labs  Lab 02/19/21 1256  NA 130*  K 4.5  CL 91*  CO2 28  GLUCOSE 97  BUN 6  CREATININE 0.68  CALCIUM 9.6   GFR: CrCl cannot be calculated (Unknown ideal weight.). Liver Function Tests: Recent Labs  Lab 02/19/21 1327  AST 27  ALT 29  ALKPHOS 52  BILITOT 0.9  PROT 6.4*  ALBUMIN 3.9   No results for input(s): LIPASE, AMYLASE in the last 168 hours. No results for input(s): AMMONIA in the last 168 hours. Coagulation Profile: No results for input(s): INR, PROTIME in the last 168 hours. Cardiac Enzymes: No results for input(s): CKTOTAL, CKMB, CKMBINDEX, TROPONINI in the last 168 hours. BNP (last 3 results) No results for input(s): PROBNP in the last 8760 hours. HbA1C: No results for input(s): HGBA1C in the last 72 hours. CBG: No results for input(s): GLUCAP in the last 168 hours. Lipid Profile: No  results for input(s): CHOL, HDL, LDLCALC, TRIG, CHOLHDL, LDLDIRECT in the last 72 hours. Thyroid Function Tests: No results for input(s): TSH, T4TOTAL, FREET4, T3FREE, THYROIDAB in the last 72 hours. Anemia Panel: Recent Labs    02/19/21 1918  VITAMINB12 259   Urine analysis: No results found for: COLORURINE, APPEARANCEUR, LABSPEC, PHURINE, GLUCOSEU, HGBUR, BILIRUBINUR, KETONESUR, PROTEINUR, UROBILINOGEN, NITRITE, LEUKOCYTESUR  Radiological Exams on Admission - Personally Reviewed: DG Chest 2 View  Result Date: 02/19/2021 CLINICAL DATA:  50 year old male with shortness of breath. EXAM: CHEST - 2 VIEW COMPARISON:  03/04/2014 FINDINGS: The mediastinal contours are within normal limits. Mild cardiomegaly. Low lung volumes. My cephalization of pulmonary vasculature. Mild diffuse hazy pulmonary opacities with a bibasilar predominance. No pneumothorax  or evidence of significant pleural effusion. No acute osseous abnormality. IMPRESSION: Mild pulmonary edema. Electronically Signed   By: Marliss Coots M.D.   On: 02/19/2021 13:59   CT Head Wo Contrast  Result Date: 02/19/2021 CLINICAL DATA:  Neuro deficit, acute, stroke suspected EXAM: CT HEAD WITHOUT CONTRAST TECHNIQUE: Contiguous axial images were obtained from the base of the skull through the vertex without intravenous contrast. COMPARISON:  None. FINDINGS: Brain: No evidence of acute infarction, hemorrhage, hydrocephalus, extra-axial collection or mass lesion/mass effect. Vascular: No hyperdense vessel or unexpected calcification. Skull: Normal. Negative for fracture or focal lesion. Sinuses/Orbits: Mild mucosal thickening of the LEFT greater than RIGHT maxillary sinuses. Other: None. IMPRESSION: No acute intracranial abnormality. Electronically Signed   By: Meda Klinefelter M.D.   On: 02/19/2021 14:57   MR BRAIN WO CONTRAST  Result Date: 02/19/2021 CLINICAL DATA:  Neuro deficit, acute, stroke suspected. Generalized weakness and dizziness. EXAM: MRI HEAD WITHOUT CONTRAST TECHNIQUE: Multiplanar, multiecho pulse sequences of the brain and surrounding structures were obtained without intravenous contrast. COMPARISON:  Head CT same day FINDINGS: Brain: The brain has a normal appearance without evidence of malformation, atrophy, old or acute small or large vessel infarction, mass lesion, hemorrhage, hydrocephalus or extra-axial collection. Vascular: Major vessels at the base of the brain show flow. Venous sinuses appear patent. Skull and upper cervical spine: Normal. Sinuses/Orbits: Clear/normal. Other: None significant. IMPRESSION: Normal MRI of the brain. Electronically Signed   By: Paulina Fusi M.D.   On: 02/19/2021 18:23   MR THORACIC SPINE WO CONTRAST  Result Date: 02/19/2021 CLINICAL DATA:  Mid back pain.  Weakness and dizziness. EXAM: MRI THORACIC SPINE WITHOUT CONTRAST TECHNIQUE:  Multiplanar, multisequence MR imaging of the thoracic spine was performed. No intravenous contrast was administered. COMPARISON:  None. FINDINGS: Alignment:  Normal Vertebrae: Normal Cord: Thoracic cord is normal. No focal lesion. No cord compression. Paraspinal and other soft tissues: Normal Disc levels: No evidence of disc degeneration, bulge or herniation. No facet arthropathy. Insignificant superior endplate Schmorl's node at T9 without edema. IMPRESSION: Normal MRI of the thoracic spine. No cord abnormality. No abnormality seen to explain regional pain. Electronically Signed   By: Paulina Fusi M.D.   On: 02/19/2021 18:25   MR LUMBAR SPINE WO CONTRAST  Result Date: 02/19/2021 CLINICAL DATA:  Low back pain. Cauda equina syndrome. Generalized weakness. EXAM: MRI LUMBAR SPINE WITHOUT CONTRAST TECHNIQUE: Multiplanar, multisequence MR imaging of the lumbar spine was performed. No intravenous contrast was administered. COMPARISON:  None. FINDINGS: Segmentation:  5 lumbar type vertebral bodies assumed. Alignment:  Mild curvature convex to the left with the apex at L3. Vertebrae:  No fracture or focal bone lesion. Conus medullaris and cauda equina: Conus extends to the L1 level. Conus and cauda equina appear  normal. Paraspinal and other soft tissues: Negative Disc levels: L1-2: Mild noncompressive disc bulge. L2-3: Mild noncompressive disc bulge. L3-4: Mild noncompressive disc bulge. L4-5: Mild bulging of the disc. Mild facet and ligamentous hypertrophy. Mild narrowing of the lateral recesses left more than right. Some potential the left L5 nerve could be affected in this location. L5-S1: Normal interspace. IMPRESSION: Mild non-compressive disc bulges at L1-2, L2-3 and L3-4. L4-5 disc bulge more prominent towards the left. Mild facet and ligamentous hypertrophy. Mild stenosis of the left lateral recess at this level that could possibly cause compression of the left L5 nerve. Electronically Signed   By: Paulina Fusi  M.D.   On: 02/19/2021 18:27   US Abdomen Limited RUQ (LIVER/GB)  Result Date: 02/19/2021 CLINICAL DATA:  Cirrhosis. EXAM: ULTRASOUND ABDOMEN LIMITED RIGHT UPPER QUADRANT COMPARISON:  None. FINDINGS: Gallbladder: Partially distended. No visualized gallstones. Diffuse gallbladder wall thickening at 6 mm. No sonographic Murphy sign noted by sonographer. Common bile duct: Diameter: 5 mm, normal. Liver: Heterogeneous, coarsened, and diffusely increased in parenchymal echogenicity. The liver parenchyma is difficult to penetrate. Allowing for this, no focal lesion is seen. There may be a subtle capsular nodularity. Portal vein is patent on color Doppler imaging with normal direction of blood flow towards the liver. Other: No right upper quadrant ascites. IMPRESSION: 1. Heterogeneous, coarsened, and increased hepatic parenchymal echogenicity. There may be mild capsular nodularity. Findings consistent with steatosis and possible cirrhosis. 2. Liver parenchyma is difficult to penetrate, no focal lesion is seen. 3. Mild gallbladder wall thickening is nonspecific in the setting of chronic liver disease. No gallstones. No biliary dilatation. Electronically Signed   By: Narda Rutherford M.D.   On: 02/19/2021 21:05    EKG: Personally reviewed.  Rhythm is normal sinus rhythm with heart rate of 94 bpm.  No dynamic ST segment changes appreciated.  Assessment/Plan  * Acute respiratory failure with hypoxia (HCC) Respiratory failure felt to be secondary to acute pulmonary edema that has developed over the last several weeks, likely due to undiagnosed alcoholic cardiomyopathy with uncontrolled hypertension BNP markedly elevated at 698 with chest x-ray identifying acute pulmonary edema. Patient has been initiated on Lasix 40 mg IV every 12 hours Strict input and output monitoring Supplemental oxygen for associated hypoxia ABG has been ordered and is pending. Monitoring renal function and electrolytes with serial  chemistires Daily weights Echocardiogram in the morning  Alcohol withdrawal delirium, acute, hypoactive (HCC) Patient exhibiting evidence of acute alcohol withdrawal with significant tremor, diaphoresis, hypertension, tachycardia.  This likely began to develop after the patient cut back on his drinking when he started to become short of breath 3 weeks ago. Patient additionally exhibiting bouts of confusion over the past several weeks and during my evaluation.  Concerned that this may be related either to the alcohol withdrawal suggestive of impending delirium tremens.   Alternatively this could be secondary to Korsakoff syndrome,  severe vitamin B12 deficiency or possibly even undiagnosed hepatic encephalopathy  CIWA protocol initiated Aggressively administering scheduled intravenous benzodiazepines along with additional dosing as needed for elevated CIWA scores  Due to degree of withdrawal placing patient in progressive unit for close monitoring Monitoring electrolytes via serial chemistries and replacing as necessary  Hypertensive urgency Markedly elevated blood pressures throughout the emergency department stay with worsening hypertension Possibly secondary to alcohol withdrawal and therefore alcohol withdrawal will be treated aggressively with benzodiazepines Additionally restarting patient's home regimen of oral and hypertensives including amlodipine, lisinopril and propranolol As needed intravenous and hypertensives for  markedly elevated blood pressure  Fall at home, initial encounter Patient experiencing progressively worsening unsteady gait with bouts of falls at home in the past several weeks Work-up in the emergency department has not identified a cause thus far with no evidence of acute disease on MRI brain or MRI of thoracic and lumbar spine. Possibly related to alcohol withdrawal or possibly secondary to severe thiamine or B12 deficiency Obtaining ammonia, vitamin B1, vitamin B12  levels Neurology has been consulted by the emergency department staff, appreciate their input on the issue.  Nicotine dependence, cigarettes, uncomplicated Counseling on cessation Patient requesting nicotine replacement therapy, a patch has been ordered  Elevated troponin level not due myocardial infarction Slightly elevated serial troponins with flat trajectory of elevation Patient is chest pain-free Likely secondary to underlying illness, plaque rupture is unlikely Monitoring patient on telemetry      Code Status:  Full code  code status decision has been confirmed with: wife Family Communication: Wife is at bedside who has been updated on plan of care  Status is: Inpatient  Remains inpatient appropriate because: Alcohol withdrawal complicated by possible impending delirium tremens as well as acute respiratory failure requiring supplemental oxygen, intravenous diuretics and close monitoring for rapid clinical decompensation in the progressive unit.  CRITICAL CARE ATTESTATION:  Patient is at risk of life-threatening morbidity mortality secondary to underlying alcohol withdrawal and acute hypoxic respiratory failure.  Providing patient with aggressive amounts of intravenous benzodiazepines, administration of intravenous diuretics, supplemental oxygen and monitoring closely for cardiac and neurologic complications.  Critical care time spent 61 minutes.       Marinda Elk MD [facility] Pager [phone]  If 7PM-7AM, please contact night-coverage [email or web address] Use universal [place] password for that web site. If you do not have the password, please call the hospital operator.  02/19/2021, 9:32 PM

## 2021-02-19 NOTE — Assessment & Plan Note (Signed)
·   Slightly elevated serial troponins with flat trajectory of elevation °· Patient is chest pain-free °· Likely secondary to underlying illness, plaque rupture is unlikely °· Monitoring patient on telemetry ° ° ° ° ° ° °

## 2021-02-19 NOTE — ED Notes (Signed)
Patient transported to CT 

## 2021-02-19 NOTE — Progress Notes (Signed)
RT note: ABG results: Ph 7.57 PCO2 32.2  PAO2 142 HCO3 30.1  Isat is not down loading at this time.

## 2021-02-19 NOTE — Assessment & Plan Note (Addendum)
   Patient exhibiting evidence of acute alcohol withdrawal with significant tremor, diaphoresis, hypertension, tachycardia.  This likely began to develop after the patient cut back on his drinking when he started to become short of breath 3 weeks ago.  Patient additionally exhibiting bouts of confusion over the past several weeks and during my evaluation.  Concerned that this may be related either to the alcohol withdrawal suggestive of impending delirium tremens.    Alternatively this could be secondary to Korsakoff syndrome,  severe vitamin B12 deficiency or possibly even undiagnosed hepatic encephalopathy   CIWA protocol initiated  Aggressively administering scheduled intravenous benzodiazepines along with additional dosing as needed for elevated CIWA scores   Due to degree of withdrawal placing patient in progressive unit for close monitoring  Monitoring electrolytes via serial chemistries and replacing as necessary

## 2021-02-19 NOTE — ED Triage Notes (Addendum)
Pt from home BIB EMS for c/o [facility] that started this am. Pt denies CP on arrival, however reports he had some CP this am. Pt SpO2 80's on RA. EMS administered 5mg  Albuterol, 125mg  Solumedrol IV, and placed pt on NRB. Pt wheezing on arrival. 91% on RA. Pt also reports having some lower back pain, ABD distention, and some pain/numbness/tingling in BLE since an injury about 2 wks ago.  208/120 HR 95 98% on 6L CBG 112

## 2021-02-19 NOTE — ED Notes (Signed)
Patient transported to MRI 

## 2021-02-19 NOTE — ED Notes (Signed)
Patient transported to X-ray 

## 2021-02-20 ENCOUNTER — Inpatient Hospital Stay (HOSPITAL_COMMUNITY): Payer: Self-pay

## 2021-02-20 DIAGNOSIS — I5021 Acute systolic (congestive) heart failure: Secondary | ICD-10-CM

## 2021-02-20 LAB — URINALYSIS, ROUTINE W REFLEX MICROSCOPIC
Bacteria, UA: NONE SEEN
Bilirubin Urine: NEGATIVE
Glucose, UA: NEGATIVE mg/dL
Hgb urine dipstick: NEGATIVE
Ketones, ur: NEGATIVE mg/dL
Leukocytes,Ua: NEGATIVE
Nitrite: NEGATIVE
Protein, ur: 30 mg/dL — AB
Specific Gravity, Urine: 1.008 (ref 1.005–1.030)
pH: 6 (ref 5.0–8.0)

## 2021-02-20 LAB — ECHOCARDIOGRAM COMPLETE
Height: 76 in
S' Lateral: 3.8 cm
Weight: 5365.11 oz

## 2021-02-20 LAB — CBC WITH DIFFERENTIAL/PLATELET
Abs Immature Granulocytes: 0.01 10*3/uL (ref 0.00–0.07)
Basophils Absolute: 0 10*3/uL (ref 0.0–0.1)
Basophils Relative: 0 %
Eosinophils Absolute: 0 10*3/uL (ref 0.0–0.5)
Eosinophils Relative: 0 %
HCT: 41.4 % (ref 39.0–52.0)
Hemoglobin: 14.2 g/dL (ref 13.0–17.0)
Immature Granulocytes: 0 %
Lymphocytes Relative: 3 %
Lymphs Abs: 0.2 10*3/uL — ABNORMAL LOW (ref 0.7–4.0)
MCH: 35.4 pg — ABNORMAL HIGH (ref 26.0–34.0)
MCHC: 34.3 g/dL (ref 30.0–36.0)
MCV: 103.2 fL — ABNORMAL HIGH (ref 80.0–100.0)
Monocytes Absolute: 0.3 10*3/uL (ref 0.1–1.0)
Monocytes Relative: 4 %
Neutro Abs: 5.9 10*3/uL (ref 1.7–7.7)
Neutrophils Relative %: 93 %
Platelets: 143 10*3/uL — ABNORMAL LOW (ref 150–400)
RBC: 4.01 MIL/uL — ABNORMAL LOW (ref 4.22–5.81)
RDW: 12.4 % (ref 11.5–15.5)
WBC: 6.3 10*3/uL (ref 4.0–10.5)
nRBC: 0 % (ref 0.0–0.2)

## 2021-02-20 LAB — COMPREHENSIVE METABOLIC PANEL
ALT: 28 U/L (ref 0–44)
AST: 20 U/L (ref 15–41)
Albumin: 3.7 g/dL (ref 3.5–5.0)
Alkaline Phosphatase: 47 U/L (ref 38–126)
Anion gap: 9 (ref 5–15)
BUN: 9 mg/dL (ref 6–20)
CO2: 31 mmol/L (ref 22–32)
Calcium: 9.2 mg/dL (ref 8.9–10.3)
Chloride: 93 mmol/L — ABNORMAL LOW (ref 98–111)
Creatinine, Ser: 0.68 mg/dL (ref 0.61–1.24)
GFR, Estimated: >60 mL/min (ref >60)
Glucose, Bld: 126 mg/dL — ABNORMAL HIGH (ref 70–99)
Potassium: 4.4 mmol/L (ref 3.5–5.1)
Sodium: 133 mmol/L — ABNORMAL LOW (ref 135–145)
Total Bilirubin: 0.9 mg/dL (ref 0.3–1.2)
Total Protein: 6.8 g/dL (ref 6.5–8.1)

## 2021-02-20 LAB — POCT I-STAT 7, (LYTES, BLD GAS, ICA,H+H)
Acid-Base Excess: 8 mmol/L — ABNORMAL HIGH (ref 0.0–2.0)
Bicarbonate: 30.1 mmol/L — ABNORMAL HIGH (ref 20.0–28.0)
Calcium, Ion: 1.12 mmol/L — ABNORMAL LOW (ref 1.15–1.40)
HCT: 41 % (ref 39.0–52.0)
Hemoglobin: 13.9 g/dL (ref 13.0–17.0)
O2 Saturation: 100 %
Patient temperature: 98.6
Potassium: 5 mmol/L (ref 3.5–5.1)
Sodium: 128 mmol/L — ABNORMAL LOW (ref 135–145)
TCO2: 31 mmol/L (ref 22–32)
pCO2 arterial: 32.2 mmHg (ref 32.0–48.0)
pH, Arterial: 7.579 — ABNORMAL HIGH (ref 7.350–7.450)
pO2, Arterial: 142 mmHg — ABNORMAL HIGH (ref 83.0–108.0)

## 2021-02-20 LAB — ETHANOL: Alcohol, Ethyl (B): <10 mg/dL (ref <10)

## 2021-02-20 LAB — RPR: RPR Ser Ql: NONREACTIVE

## 2021-02-20 LAB — HEPATITIS PANEL, ACUTE
HCV Ab: NONREACTIVE
Hep A IgM: NONREACTIVE
Hep B C IgM: NONREACTIVE
Hepatitis B Surface Ag: NONREACTIVE

## 2021-02-20 LAB — HIV ANTIBODY (ROUTINE TESTING W REFLEX): HIV Screen 4th Generation wRfx: NONREACTIVE

## 2021-02-20 LAB — MAGNESIUM: Magnesium: 1.4 mg/dL — ABNORMAL LOW (ref 1.7–2.4)

## 2021-02-20 LAB — GLUCOSE, CAPILLARY: Glucose-Capillary: 133 mg/dL — ABNORMAL HIGH (ref 70–99)

## 2021-02-20 MED ORDER — ALBUTEROL SULFATE (2.5 MG/3ML) 0.083% IN NEBU
2.5000 mg | INHALATION_SOLUTION | Freq: Four times a day (QID) | RESPIRATORY_TRACT | Status: DC | PRN
  Administered 2021-02-20 – 2021-02-21 (×2): 2.5 mg via RESPIRATORY_TRACT
  Filled 2021-02-20 (×2): qty 3

## 2021-02-20 MED ORDER — THIAMINE HCL 100 MG/ML IJ SOLN
500.0000 mg | INTRAMUSCULAR | Status: AC
  Administered 2021-02-21 – 2021-02-22 (×2): 500 mg via INTRAVENOUS
  Filled 2021-02-20 (×2): qty 5

## 2021-02-20 MED ORDER — CLONIDINE HCL 0.1 MG PO TABS: 0.1000 mg | ORAL_TABLET | Freq: Four times a day (QID) | ORAL | Status: DC | PRN

## 2021-02-20 MED ORDER — CHLORDIAZEPOXIDE HCL 5 MG PO CAPS: 10.0000 mg | ORAL_CAPSULE | Freq: Three times a day (TID) | ORAL | Status: DC

## 2021-02-20 MED ORDER — THIAMINE HCL 100 MG PO TABS
100.0000 mg | ORAL_TABLET | Freq: Every day | ORAL | Status: DC
  Administered 2021-02-23 – 2021-03-01 (×7): 100 mg via ORAL
  Filled 2021-02-20 (×8): qty 1

## 2021-02-20 MED ORDER — CYANOCOBALAMIN 1000 MCG/ML IJ SOLN
1000.0000 ug | Freq: Every day | INTRAMUSCULAR | Status: AC
Start: 2021-02-20 — End: 2021-02-27
  Administered 2021-02-20 – 2021-02-26 (×5): 1000 ug via SUBCUTANEOUS
  Filled 2021-02-20 (×7): qty 1

## 2021-02-20 MED ORDER — CHLORDIAZEPOXIDE HCL 5 MG PO CAPS
15.0000 mg | ORAL_CAPSULE | Freq: Three times a day (TID) | ORAL | Status: DC
  Administered 2021-02-20 – 2021-02-22 (×7): 15 mg via ORAL
  Filled 2021-02-20 (×8): qty 3

## 2021-02-20 MED ORDER — MAGNESIUM SULFATE 4 GM/100ML IV SOLN
4.0000 g | Freq: Once | INTRAVENOUS | Status: AC
  Administered 2021-02-20: 4 g via INTRAVENOUS
  Filled 2021-02-20: qty 100

## 2021-02-20 MED ORDER — METOPROLOL TARTRATE 100 MG PO TABS
100.0000 mg | ORAL_TABLET | Freq: Two times a day (BID) | ORAL | Status: DC
  Administered 2021-02-20 – 2021-02-26 (×13): 100 mg via ORAL
  Filled 2021-02-20 (×14): qty 1

## 2021-02-20 MED ORDER — LISINOPRIL 5 MG PO TABS
5.0000 mg | ORAL_TABLET | Freq: Every day | ORAL | Status: DC
  Administered 2021-02-21 – 2021-03-01 (×9): 5 mg via ORAL
  Filled 2021-02-20 (×9): qty 1

## 2021-02-20 NOTE — Progress Notes (Signed)
  Echocardiogram 2D Echocardiogram has been performed.  Delcie Roch 02/20/2021, 10:18 AM

## 2021-02-20 NOTE — Evaluation (Signed)
Physical Therapy Evaluation Patient Details Name: Colin Stevens MRN: [id number] DOB: 1970/05/24 Today's Date: 02/20/2021  History of Present Illness  Pt adm 10/22 with acute hypoxic resp failure, etoh withdrawal and HTN urgency. PMH - HTN, etoh abuse  Clinical Impression  Pt presents to PT currently with severe limitations in mobility due to weakness, poor balance, and poor cognition. Pt currently in etoh withdrawal so I deferred making dc recommendation until we can see how he does as withdrawal symptoms lessen. At current level pt would need extensive rehab but hopefully will improve significantly after withdrawal.        Recommendations for follow up therapy are one component of a multi-disciplinary discharge planning process, led by the attending physician.  Recommendations may be updated based on patient status, additional functional criteria and insurance authorization.  Follow Up Recommendations Other (comment) (To be determined after etoh withdrawal improved)    Equipment Recommendations  Other (comment) (to be determined)    Recommendations for Other Services       Precautions / Restrictions Precautions Precautions: Fall      Mobility  Bed Mobility Overal bed mobility: Needs Assistance Bed Mobility: Supine to Sit;Sit to Supine     Supine to sit: +2 for physical assistance;Max assist;HOB elevated Sit to supine: +2 for physical assistance;Max assist   General bed mobility comments: Assist for all aspects    Transfers                 General transfer comment: Did not attempt due to safety  Ambulation/Gait                Stairs            Wheelchair Mobility    Modified Rankin (Stroke Patients Only)       Balance Overall balance assessment: Needs assistance Sitting-balance support: Bilateral upper extremity supported;Feet supported Sitting balance-Leahy Scale: Poor Sitting balance - Comments: min assist due to posterior lean despite  verbal cues Postural control: Posterior lean                                   Pertinent Vitals/Pain Pain Assessment: Faces Faces Pain Scale: Hurts even more Pain Location: BLE's below the knees Pain Descriptors / Indicators: Grimacing;Guarding;Moaning Pain Intervention(s): Limited activity within patient's tolerance;Repositioned;Monitored during session    Home Living Family/patient expects to be discharged to:: Private residence Living Arrangements: Spouse/significant other Available Help at Discharge: Family Type of Home: House Home Access: Stairs to enter Entrance Stairs-Rails: Doctor, general practice of Steps: 4 Home Layout: One level Home Equipment: None      Prior Function Level of Independence: Independent         Comments: Doesn't work. Recent falls     Hand Dominance        Extremity/Trunk Assessment   Upper Extremity Assessment Upper Extremity Assessment: Defer to OT evaluation    Lower Extremity Assessment Lower Extremity Assessment: LLE deficits/detail;RLE deficits/detail RLE Deficits / Details: Grossly 2+ to 3-/5 LLE Deficits / Details: Grossly 2+ to 3-/5       Communication      Cognition Arousal/Alertness: Awake/alert Behavior During Therapy: Flat affect Overall Cognitive Status: Impaired/Different from baseline Area of Impairment: Attention;Following commands;Problem solving;Awareness                   Current Attention Level: Sustained   Following Commands: Follows one step commands with increased time  Awareness: Intellectual Problem Solving: Slow processing;Decreased initiation;Requires verbal cues;Requires tactile cues;Difficulty sequencing        General Comments General comments (skin integrity, edema, etc.): VSS on 3L O2    Exercises     Assessment/Plan    PT Assessment Patient needs continued PT services  PT Problem List Decreased strength;Decreased balance;Decreased  cognition;Pain;Decreased mobility;Obesity;Decreased activity tolerance       PT Treatment Interventions DME instruction;Functional mobility training;Balance training;Patient/family education;Gait training;Therapeutic activities;Stair training;Therapeutic exercise;Cognitive remediation    PT Goals (Current goals can be found in the Care Plan section)  Acute Rehab PT Goals Patient Stated Goal: not stated PT Goal Formulation: With family Time For Goal Achievement: 03/06/21 Potential to Achieve Goals: Fair    Frequency Min 3X/week   Barriers to discharge        Co-evaluation               AM-PAC PT "6 Clicks" Mobility  Outcome Measure Help needed turning from your back to your side while in a flat bed without using bedrails?: Total Help needed moving from lying on your back to sitting on the side of a flat bed without using bedrails?: Total Help needed moving to and from a bed to a chair (including a wheelchair)?: Total Help needed standing up from a chair using your arms (e.g., wheelchair or bedside chair)?: Total Help needed to walk in hospital room?: Total Help needed climbing 3-5 steps with a railing? : Total 6 Click Score: 6    End of Session Equipment Utilized During Treatment: Oxygen Activity Tolerance: Patient limited by fatigue Patient left: in bed;with call bell/phone within reach;with bed alarm set;with family/visitor present Nurse Communication: Mobility status PT Visit Diagnosis: Other abnormalities of gait and mobility (R26.89);History of falling (Z91.81);Muscle weakness (generalized) (M62.81);Difficulty in walking, not elsewhere classified (R26.2)    Time: 6962-9528 PT Time Calculation (min) (ACUTE ONLY): 25 min   Charges:   PT Evaluation $PT Eval Moderate Complexity: 1 Mod PT Treatments $Therapeutic Activity: 8-22 mins        [facility] PT Acute Rehabilitation Services Pager [phone] Office [phone]   Angelina Ok [facility] 02/20/2021, 2:21  PM

## 2021-02-20 NOTE — Progress Notes (Signed)
PROGRESS NOTE                                                                                                                                                                                                             Patient Demographics:    Colin Stevens, is a 50 y.o. male, DOB - [birth year], VPX:[id number]  Outpatient Primary MD for the patient is Rankins, Fanny Dance, MD    LOS - 1  Admit date - 02/19/2021    Chief Complaint  Patient presents with   Shortness of Breath       Brief Narrative (HPI from H&P)  50 year old male with past medical history of hypertension, nicotine dependence, alcohol abuse who presents to [facility] emergency department via EMS with shortness of breath and DTs.   Subjective:    Colin Stevens today has, No headache, No chest pain, No abdominal pain - No Nausea, No new weakness tingling or numbness, no SOB.   Assessment  & Plan :     Acute Hypoxic Resp. Failure due to Acute on Chronic CHF -echocardiogram pending, placed on IV Lasix along with beta-blocker monitor, for now liquid diet due to DTs.  2. DTS - Librium along with CIWA protocol, also has borderline B12 deficiency and placed on replacement, also on thiamine and folic acid replacement, neuro following.  MRI brain unremarkable.  If gets any worse may require Precedex in ICU.  3.  Hypertensive urgency.  Treating DTs, medications adjusted for blood pressure.  Monitor.  4.  Fall at home.  MRI brain along with spine unremarkable.  Mild L5 left-sided nerve compression which will be monitored, PT OT once DTs are better.  5. Ongoing nicotine dependence and alcohol abuse.  Counseled to quit.  6.  Obesity.  BMI of 40.  Follow with PCP for weight loss.  7.  Hypomagnesemia.  Replaced.  8.  Ultrasound suggestive of cirrhosis with thrombocytopenia on ultrasound.  Likely alcoholic cirrhosis, thrombocytopenia likely due to it.  Will check  acute hepatitis panel as well while he is here.      Condition -   Guarded  Family Communication  :  Wife bedside 02/20/21  Code Status :  Full  Consults  :  Neuro  PUD Prophylaxis : PPI   Procedures  :     TTE  RUQ [place] -  Cirrhosis  MRI Brain and Spine - non acute MRI brain, MRI spine showing similar L-spine disease with possible mild L5 nerve compression on the left side.       Disposition Plan  :    Status is: Inpatient  Remains inpatient appropriate because: Severe DTs   DVT Prophylaxis  :    enoxaparin (LOVENOX) injection 40 mg Start: 02/19/21 2100   Lab Results  Component Value Date   PLT 143 (L) 02/20/2021    Diet :  Diet Order             Diet full liquid Room service appropriate? Yes; Fluid consistency: Thin  Diet effective now                    Inpatient Medications  Scheduled Meds:  chlordiazePOXIDE  15 mg Oral TID   enoxaparin (LOVENOX) injection  40 mg Subcutaneous Q24H   folic acid  1 mg Oral Daily   furosemide  40 mg Intravenous BID   [START ON 02/21/2021] lisinopril  5 mg Oral Daily   LORazepam  0-4 mg Intravenous Q6H   Followed by   Melene Muller ON 02/21/2021] LORazepam  0-4 mg Intravenous Q12H   metoprolol tartrate  100 mg Oral BID   multivitamin with minerals  1 tablet Oral Daily   nicotine  21 mg Transdermal Daily   pantoprazole  40 mg Oral Daily   [START ON 02/24/2021] thiamine  100 mg Oral Daily   Or   [START ON 02/24/2021] thiamine  100 mg Intravenous Daily   [START ON 02/22/2021] thiamine  100 mg Oral Daily   vitamin B-12  2,000 mcg Oral Daily   Continuous Infusions:  magnesium sulfate bolus IVPB 4 g (02/20/21 1041)   [START ON 02/21/2021] thiamine injection     PRN Meds:.acetaminophen **OR** acetaminophen, cloNIDine, LORazepam **OR** LORazepam, [DISCONTINUED] ondansetron **OR** ondansetron (ZOFRAN) IV, polyethylene glycol  Antibiotics  :    Anti-infectives (From admission, onward)    None        Time Spent in  minutes  30   Susa Raring M.D on 02/20/2021 at 11:42 AM  To page go to [email or web address]   [facility] -  Office  [phone]  See all Orders from today for further details    Objective:   Vitals:   02/20/21 0800 02/20/21 0900 02/20/21 1049 02/20/21 1100  BP: (!) 153/130 (!) 196/186 (!) 209/175 114/83  Pulse: (!) 122 (!) 118 (!) 106 (!) 106  Resp: (!) 21 20 18  (!) 23  Temp: 98.3 F (36.8 C)  98.3 F (36.8 C)   TempSrc: Oral  Oral   SpO2: 92% 91% 91% 94%  Weight:      Height:        Wt Readings from Last 3 Encounters:  02/20/21 (!) 152.1 kg     Intake/Output Summary (Last 24 hours) at 02/20/2021 1142 Last data filed at 02/20/2021 0500 Gross per 24 hour  Intake 46.05 ml  Output 750 ml  Net -703.95 ml     Physical Exam  Awake Alert x 1, No new F.N deficits, ++ DTs [place].AT,PERRAL Supple Neck, No JVD,   Symmetrical Chest wall movement, Good air movement bilaterally, CTAB RRR,No Gallops,Rubs or new Murmurs,  +ve B.Sounds, Abd Soft, No tenderness,   No Cyanosis, Clubbing or edema,         Data Review:    CBC Recent Labs  Lab 02/19/21 1256 02/20/21 0110  WBC 8.9 6.3  HGB 14.8 14.2  HCT 43.6 41.4  PLT 146* 143*  MCV 103.8* 103.2*  MCH 35.2* 35.4*  MCHC 33.9 34.3  RDW 12.5 12.4  LYMPHSABS  --  0.2*  MONOABS  --  0.3  EOSABS  --  0.0  BASOSABS  --  0.0    Recent Labs  Lab 02/19/21 1256 02/19/21 1327 02/19/21 1444 02/19/21 1951 02/19/21 2100 02/19/21 2259 02/20/21 0110  NA 130*  --   --   --   --   --  133*  K 4.5  --   --   --   --   --  4.4  CL 91*  --   --   --   --   --  93*  CO2 28  --   --   --   --   --  31  GLUCOSE 97  --   --   --   --   --  126*  BUN 6  --   --   --   --   --  9  CREATININE 0.68  --   --   --   --   --  0.68  CALCIUM 9.6  --   --   --   --   --  9.2  AST  --  27  --   --   --   --  20  ALT  --  29  --   --   --   --  28  ALKPHOS  --  52  --   --   --   --  47  BILITOT  --  0.9  --   --   --   --  0.9   ALBUMIN  --  3.9  --   --   --   --  3.7  MG  --   --   --   --   --   --  1.4*  LATICACIDVEN  --  0.7  --   --   --   --   --   INR  --   --   --  1.0  --   --   --   TSH  --   --   --   --   --  3.067  --   AMMONIA  --   --   --   --  30  --   --   BNP  --   --  698.0*  --   --   --   --     ------------------------------------------------------------------------------------------------------------------ No results for input(s): CHOL, HDL, LDLCALC, TRIG, CHOLHDL, LDLDIRECT in the last 72 hours.  No results found for: HGBA1C ------------------------------------------------------------------------------------------------------------------ Recent Labs    02/19/21 2259  TSH 3.067    Cardiac Enzymes No results for input(s): CKMB, TROPONINI, MYOGLOBIN in the last 168 hours.  Invalid input(s): CK ------------------------------------------------------------------------------------------------------------------    Component Value Date/Time   BNP 698.0 (H) 02/19/2021 1444     Radiology Reports DG Chest 2 View  Result Date: 02/19/2021 CLINICAL DATA:  50 year old male with shortness of breath. EXAM: CHEST - 2 VIEW COMPARISON:  03/04/2014 FINDINGS: The mediastinal contours are within normal limits. Mild cardiomegaly. Low lung volumes. My cephalization of pulmonary vasculature. Mild diffuse hazy pulmonary opacities with a bibasilar predominance. No pneumothorax or evidence of significant pleural effusion. No acute osseous abnormality. IMPRESSION: Mild pulmonary edema. Electronically Signed   By: Marliss Coots M.D.   On: 02/19/2021  13:59   CT Head Wo Contrast  Result Date: 02/19/2021 CLINICAL DATA:  Neuro deficit, acute, stroke suspected EXAM: CT HEAD WITHOUT CONTRAST TECHNIQUE: Contiguous axial images were obtained from the base of the skull through the vertex without intravenous contrast. COMPARISON:  None. FINDINGS: Brain: No evidence of acute infarction, hemorrhage, hydrocephalus,  extra-axial collection or mass lesion/mass effect. Vascular: No hyperdense vessel or unexpected calcification. Skull: Normal. Negative for fracture or focal lesion. Sinuses/Orbits: Mild mucosal thickening of the LEFT greater than RIGHT maxillary sinuses. Other: None. IMPRESSION: No acute intracranial abnormality. Electronically Signed   By: Meda Klinefelter M.D.   On: 02/19/2021 14:57   MR BRAIN WO CONTRAST  Result Date: 02/19/2021 CLINICAL DATA:  Neuro deficit, acute, stroke suspected. Generalized weakness and dizziness. EXAM: MRI HEAD WITHOUT CONTRAST TECHNIQUE: Multiplanar, multiecho pulse sequences of the brain and surrounding structures were obtained without intravenous contrast. COMPARISON:  Head CT same day FINDINGS: Brain: The brain has a normal appearance without evidence of malformation, atrophy, old or acute small or large vessel infarction, mass lesion, hemorrhage, hydrocephalus or extra-axial collection. Vascular: Major vessels at the base of the brain show flow. Venous sinuses appear patent. Skull and upper cervical spine: Normal. Sinuses/Orbits: Clear/normal. Other: None significant. IMPRESSION: Normal MRI of the brain. Electronically Signed   By: Paulina Fusi M.D.   On: 02/19/2021 18:23   MR THORACIC SPINE WO CONTRAST  Result Date: 02/19/2021 CLINICAL DATA:  Mid back pain.  Weakness and dizziness. EXAM: MRI THORACIC SPINE WITHOUT CONTRAST TECHNIQUE: Multiplanar, multisequence MR imaging of the thoracic spine was performed. No intravenous contrast was administered. COMPARISON:  None. FINDINGS: Alignment:  Normal Vertebrae: Normal Cord: Thoracic cord is normal. No focal lesion. No cord compression. Paraspinal and other soft tissues: Normal Disc levels: No evidence of disc degeneration, bulge or herniation. No facet arthropathy. Insignificant superior endplate Schmorl's node at T9 without edema. IMPRESSION: Normal MRI of the thoracic spine. No cord abnormality. No abnormality seen to explain  regional pain. Electronically Signed   By: Paulina Fusi M.D.   On: 02/19/2021 18:25   MR LUMBAR SPINE WO CONTRAST  Result Date: 02/19/2021 CLINICAL DATA:  Low back pain. Cauda equina syndrome. Generalized weakness. EXAM: MRI LUMBAR SPINE WITHOUT CONTRAST TECHNIQUE: Multiplanar, multisequence MR imaging of the lumbar spine was performed. No intravenous contrast was administered. COMPARISON:  None. FINDINGS: Segmentation:  5 lumbar type vertebral bodies assumed. Alignment:  Mild curvature convex to the left with the apex at L3. Vertebrae:  No fracture or focal bone lesion. Conus medullaris and cauda equina: Conus extends to the L1 level. Conus and cauda equina appear normal. Paraspinal and other soft tissues: Negative Disc levels: L1-2: Mild noncompressive disc bulge. L2-3: Mild noncompressive disc bulge. L3-4: Mild noncompressive disc bulge. L4-5: Mild bulging of the disc. Mild facet and ligamentous hypertrophy. Mild narrowing of the lateral recesses left more than right. Some potential the left L5 nerve could be affected in this location. L5-S1: Normal interspace. IMPRESSION: Mild non-compressive disc bulges at L1-2, L2-3 and L3-4. L4-5 disc bulge more prominent towards the left. Mild facet and ligamentous hypertrophy. Mild stenosis of the left lateral recess at this level that could possibly cause compression of the left L5 nerve. Electronically Signed   By: Paulina Fusi M.D.   On: 02/19/2021 18:27   US Abdomen Limited RUQ (LIVER/GB)  Result Date: 02/19/2021 CLINICAL DATA:  Cirrhosis. EXAM: ULTRASOUND ABDOMEN LIMITED RIGHT UPPER QUADRANT COMPARISON:  None. FINDINGS: Gallbladder: Partially distended. No visualized gallstones. Diffuse gallbladder wall  thickening at 6 mm. No sonographic Murphy sign noted by sonographer. Common bile duct: Diameter: 5 mm, normal. Liver: Heterogeneous, coarsened, and diffusely increased in parenchymal echogenicity. The liver parenchyma is difficult to penetrate. Allowing for  this, no focal lesion is seen. There may be a subtle capsular nodularity. Portal vein is patent on color Doppler imaging with normal direction of blood flow towards the liver. Other: No right upper quadrant ascites. IMPRESSION: 1. Heterogeneous, coarsened, and increased hepatic parenchymal echogenicity. There may be mild capsular nodularity. Findings consistent with steatosis and possible cirrhosis. 2. Liver parenchyma is difficult to penetrate, no focal lesion is seen. 3. Mild gallbladder wall thickening is nonspecific in the setting of chronic liver disease. No gallstones. No biliary dilatation. Electronically Signed   By: Narda Rutherford M.D.   On: 02/19/2021 21:05

## 2021-02-20 NOTE — Progress Notes (Signed)
Pt arrived to [address], drowsy but arousable.  Wife at bedside.  CCM notified, CHG completed and skin check completed.

## 2021-02-21 ENCOUNTER — Inpatient Hospital Stay (HOSPITAL_COMMUNITY): Payer: Self-pay

## 2021-02-21 LAB — COMPREHENSIVE METABOLIC PANEL
ALT: 26 U/L (ref 0–44)
AST: 24 U/L (ref 15–41)
Albumin: 3.7 g/dL (ref 3.5–5.0)
Alkaline Phosphatase: 46 U/L (ref 38–126)
Anion gap: 7 (ref 5–15)
BUN: 13 mg/dL (ref 6–20)
CO2: 32 mmol/L (ref 22–32)
Calcium: 9 mg/dL (ref 8.9–10.3)
Chloride: 92 mmol/L — ABNORMAL LOW (ref 98–111)
Creatinine, Ser: 0.68 mg/dL (ref 0.61–1.24)
GFR, Estimated: >60 mL/min (ref >60)
Glucose, Bld: 99 mg/dL (ref 70–99)
Potassium: 4.5 mmol/L (ref 3.5–5.1)
Sodium: 131 mmol/L — ABNORMAL LOW (ref 135–145)
Total Bilirubin: 1 mg/dL (ref 0.3–1.2)
Total Protein: 6.4 g/dL — ABNORMAL LOW (ref 6.5–8.1)

## 2021-02-21 LAB — CBC WITH DIFFERENTIAL/PLATELET
Abs Immature Granulocytes: 0.03 10*3/uL (ref 0.00–0.07)
Basophils Absolute: 0 10*3/uL (ref 0.0–0.1)
Basophils Relative: 0 %
Eosinophils Absolute: 0 10*3/uL (ref 0.0–0.5)
Eosinophils Relative: 0 %
HCT: 44.6 % (ref 39.0–52.0)
Hemoglobin: 14.9 g/dL (ref 13.0–17.0)
Immature Granulocytes: 0 %
Lymphocytes Relative: 5 %
Lymphs Abs: 0.6 10*3/uL — ABNORMAL LOW (ref 0.7–4.0)
MCH: 35.6 pg — ABNORMAL HIGH (ref 26.0–34.0)
MCHC: 33.4 g/dL (ref 30.0–36.0)
MCV: 106.7 fL — ABNORMAL HIGH (ref 80.0–100.0)
Monocytes Absolute: 0.8 10*3/uL (ref 0.1–1.0)
Monocytes Relative: 7 %
Neutro Abs: 10.6 10*3/uL — ABNORMAL HIGH (ref 1.7–7.7)
Neutrophils Relative %: 88 %
Platelets: 150 10*3/uL (ref 150–400)
RBC: 4.18 MIL/uL — ABNORMAL LOW (ref 4.22–5.81)
RDW: 12.8 % (ref 11.5–15.5)
WBC: 12.1 10*3/uL — ABNORMAL HIGH (ref 4.0–10.5)
nRBC: 0 % (ref 0.0–0.2)

## 2021-02-21 LAB — URINALYSIS, ROUTINE W REFLEX MICROSCOPIC
Bilirubin Urine: NEGATIVE
Glucose, UA: NEGATIVE mg/dL
Hgb urine dipstick: NEGATIVE
Ketones, ur: NEGATIVE mg/dL
Leukocytes,Ua: NEGATIVE
Nitrite: NEGATIVE
Protein, ur: NEGATIVE mg/dL
Specific Gravity, Urine: 1.013 (ref 1.005–1.030)
pH: 6 (ref 5.0–8.0)

## 2021-02-21 LAB — OSMOLALITY: Osmolality: 273 mOsm/kg — ABNORMAL LOW (ref 275–295)

## 2021-02-21 LAB — OSMOLALITY, URINE: Osmolality, Ur: 474 mOsm/kg (ref 300–900)

## 2021-02-21 LAB — BRAIN NATRIURETIC PEPTIDE: B Natriuretic Peptide: 291.1 pg/mL — ABNORMAL HIGH (ref 0.0–100.0)

## 2021-02-21 LAB — URIC ACID: Uric Acid, Serum: 5.4 mg/dL (ref 3.7–8.6)

## 2021-02-21 LAB — SODIUM, URINE, RANDOM: Sodium, Ur: 77 mmol/L

## 2021-02-21 LAB — MAGNESIUM: Magnesium: 2.2 mg/dL (ref 1.7–2.4)

## 2021-02-21 MED ORDER — LACTATED RINGERS IV SOLN: INTRAVENOUS | Status: DC

## 2021-02-21 MED ORDER — LOPERAMIDE HCL 2 MG PO CAPS
2.0000 mg | ORAL_CAPSULE | Freq: Four times a day (QID) | ORAL | Status: DC | PRN
  Administered 2021-02-21: 2 mg via ORAL
  Filled 2021-02-21: qty 1

## 2021-02-21 MED ORDER — FUROSEMIDE 10 MG/ML IJ SOLN
20.0000 mg | Freq: Once | INTRAMUSCULAR | Status: AC
  Administered 2021-02-21: 20 mg via INTRAVENOUS
  Filled 2021-02-21: qty 2

## 2021-02-21 MED ORDER — ENOXAPARIN SODIUM 80 MG/0.8ML IJ SOSY
75.0000 mg | PREFILLED_SYRINGE | INTRAMUSCULAR | Status: DC
  Administered 2021-02-21 – 2021-02-28 (×8): 75 mg via SUBCUTANEOUS
  Filled 2021-02-21 (×8): qty 0.8

## 2021-02-21 NOTE — Progress Notes (Addendum)
PROGRESS NOTE                                                                                                                                                                                                             Patient Demographics:    Colin Stevens, is a 50 y.o. male, DOB - Oct 26, 1970, NWG:[id number]  Outpatient Primary MD for the patient is Rankins, Fanny Dance, MD    LOS - 2  Admit date - 02/19/2021    Chief Complaint  Patient presents with   Shortness of Breath       Brief Narrative (HPI from H&P)  50 year old male with past medical history of hypertension, nicotine dependence, alcohol abuse who presents to [facility] emergency department via EMS with shortness of breath and DTs.   Subjective:   Patient in bed, appears comfortable, denies any headache, no fever, no chest pain or pressure, no shortness of breath , no abdominal pain. No new focal weakness.    Assessment  & Plan :     Acute Hypoxic Resp. Failure due to Acute on Chronic CHF -echocardiogram with EF 60% likely had mild acute on chronic diastolic CHF which has resolved post IV fluids, continue beta-blocker.  Monitor closely.   2.  DTS - Librium along with CIWA protocol, also has borderline B12 deficiency and placed on replacement, also on thiamine and folic acid replacement, neuro following.  MRI brain unremarkable.  If gets any worse may require Precedex in ICU.  3.  Hypertensive urgency.  Treating DTs, medications adjusted for blood pressure.  Monitor.  4.  Fall at home.  MRI brain along with spine unremarkable.  Mild L5 left-sided nerve compression which will be monitored, PT OT once DTs are better.  5. Ongoing nicotine dependence and alcohol abuse.  Counseled to quit.  6.  Obesity.  BMI of 40.  Follow with PCP for weight loss.  7.  Hypomagnesemia.  Replaced.  8.  Ultrasound suggestive of cirrhosis with thrombocytopenia on  ultrasound.  Likely alcoholic cirrhosis, thrombocytopenia likely due to it.  Negative acute hepatitis panel.  9.  Hyponatremia.  Monitor urine electrolytes.  Hold further Lasix and repeat electrolytes in the morning.      Condition -   Guarded  Family Communication  :  Wife bedside 02/20/21, called [phone] on 02/21/21 at 11.57 am >  10 rings, called and updated over the phone in the spittle room.  Code Status :  Full  Consults  :  Neuro  PUD Prophylaxis : PPI   Procedures  :     TTE  RUQ [place] - Cirrhosis  MRI Brain and Spine - non acute MRI brain, MRI spine showing similar L-spine disease with possible mild L5 nerve compression on the left side.       Disposition Plan  :    Status is: Inpatient  Remains inpatient appropriate because: Severe DTs   DVT Prophylaxis  :    enoxaparin (LOVENOX) injection 40 mg Start: 02/19/21 2100   Lab Results  Component Value Date   PLT 150 02/21/2021    Diet :  Diet Order             Diet full liquid Room service appropriate? Yes; Fluid consistency: Thin  Diet effective now                    Inpatient Medications  Scheduled Meds:  chlordiazePOXIDE  15 mg Oral TID   cyanocobalamin  1,000 mcg Subcutaneous Daily   enoxaparin (LOVENOX) injection  40 mg Subcutaneous Q24H   folic acid  1 mg Oral Daily   furosemide  40 mg Intravenous BID   lisinopril  5 mg Oral Daily   LORazepam  0-4 mg Intravenous Q6H   Followed by   LORazepam  0-4 mg Intravenous Q12H   metoprolol tartrate  100 mg Oral BID   multivitamin with minerals  1 tablet Oral Daily   nicotine  21 mg Transdermal Daily   pantoprazole  40 mg Oral Daily   [START ON 02/24/2021] thiamine  100 mg Oral Daily   Or   [START ON 02/24/2021] thiamine  100 mg Intravenous Daily   [START ON 02/22/2021] thiamine  100 mg Oral Daily   Continuous Infusions:  thiamine injection 500 mg (02/21/21 0846)   PRN Meds:.acetaminophen **OR** acetaminophen, albuterol, cloNIDine,  LORazepam **OR** LORazepam, [DISCONTINUED] ondansetron **OR** ondansetron (ZOFRAN) IV, polyethylene glycol  Antibiotics  :    Anti-infectives (From admission, onward)    None        Time Spent in minutes  30   Susa Raring M.D on 02/21/2021 at 11:57 AM  To page go to [email or web address]   [facility] -  Office  [phone]  See all Orders from today for further details    Objective:   Vitals:   02/21/21 0355 02/21/21 0850 02/21/21 1000 02/21/21 1058  BP: 120/69 127/74 117/82 117/82  Pulse: 99 90 97 91  Resp: 20 (!) 26  (!) 23  Temp: 98.6 F (37 C) 98.6 F (37 C)  98.6 F (37 C)  TempSrc: Oral Oral  Oral  SpO2: 93% 97%  91%  Weight: (!) 149.9 kg     Height:        Wt Readings from Last 3 Encounters:  02/21/21 (!) 149.9 kg     Intake/Output Summary (Last 24 hours) at 02/21/2021 1157 Last data filed at 02/20/2021 2350 Gross per 24 hour  Intake 100 ml  Output 3400 ml  Net -3300 ml     Physical Exam  Awake Alert x 2, No new F.N deficits, + DTs [place].AT,PERRAL Supple Neck, No JVD,   Symmetrical Chest wall movement, Good air movement bilaterally, CTAB RRR,No Gallops, Rubs or new Murmurs,  +ve B.Sounds, Abd Soft, No tenderness,   No Cyanosis, Clubbing or edema,  Data Review:    CBC Recent Labs  Lab 02/19/21 1256 02/19/21 2201 02/20/21 0110 02/21/21 0320  WBC 8.9  --  6.3 12.1*  HGB 14.8 13.9 14.2 14.9  HCT 43.6 41.0 41.4 44.6  PLT 146*  --  143* 150  MCV 103.8*  --  103.2* 106.7*  MCH 35.2*  --  35.4* 35.6*  MCHC 33.9  --  34.3 33.4  RDW 12.5  --  12.4 12.8  LYMPHSABS  --   --  0.2* 0.6*  MONOABS  --   --  0.3 0.8  EOSABS  --   --  0.0 0.0  BASOSABS  --   --  0.0 0.0    Recent Labs  Lab 02/19/21 1256 02/19/21 1327 02/19/21 1444 02/19/21 1951 02/19/21 2100 02/19/21 2201 02/19/21 2259 02/20/21 0110 02/21/21 0320  NA 130*  --   --   --   --  128*  --  133* 131*  K 4.5  --   --   --   --  5.0  --  4.4 4.5  CL 91*  --    --   --   --   --   --  93* 92*  CO2 28  --   --   --   --   --   --  31 32  GLUCOSE 97  --   --   --   --   --   --  126* 99  BUN 6  --   --   --   --   --   --  9 13  CREATININE 0.68  --   --   --   --   --   --  0.68 0.68  CALCIUM 9.6  --   --   --   --   --   --  9.2 9.0  AST  --  27  --   --   --   --   --  20 24  ALT  --  29  --   --   --   --   --  28 26  ALKPHOS  --  52  --   --   --   --   --  47 46  BILITOT  --  0.9  --   --   --   --   --  0.9 1.0  ALBUMIN  --  3.9  --   --   --   --   --  3.7 3.7  MG  --   --   --   --   --   --   --  1.4* 2.2  LATICACIDVEN  --  0.7  --   --   --   --   --   --   --   INR  --   --   --  1.0  --   --   --   --   --   TSH  --   --   --   --   --   --  3.067  --   --   AMMONIA  --   --   --   --  30  --   --   --   --   BNP  --   --  698.0*  --   --   --   --   --  291.1*    ------------------------------------------------------------------------------------------------------------------  No results for input(s): CHOL, HDL, LDLCALC, TRIG, CHOLHDL, LDLDIRECT in the last 72 hours.  No results found for: HGBA1C ------------------------------------------------------------------------------------------------------------------ Recent Labs    02/19/21 2259  TSH 3.067    Cardiac Enzymes No results for input(s): CKMB, TROPONINI, MYOGLOBIN in the last 168 hours.  Invalid input(s): CK ------------------------------------------------------------------------------------------------------------------    Component Value Date/Time   BNP 291.1 (H) 02/21/2021 0320     Radiology Reports DG Chest 2 View  Result Date: 02/19/2021 CLINICAL DATA:  50 year old male with shortness of breath. EXAM: CHEST - 2 VIEW COMPARISON:  03/04/2014 FINDINGS: The mediastinal contours are within normal limits. Mild cardiomegaly. Low lung volumes. My cephalization of pulmonary vasculature. Mild diffuse hazy pulmonary opacities with a bibasilar predominance. No  pneumothorax or evidence of significant pleural effusion. No acute osseous abnormality. IMPRESSION: Mild pulmonary edema. Electronically Signed   By: Marliss Coots M.D.   On: 02/19/2021 13:59   CT Head Wo Contrast  Result Date: 02/19/2021 CLINICAL DATA:  Neuro deficit, acute, stroke suspected EXAM: CT HEAD WITHOUT CONTRAST TECHNIQUE: Contiguous axial images were obtained from the base of the skull through the vertex without intravenous contrast. COMPARISON:  None. FINDINGS: Brain: No evidence of acute infarction, hemorrhage, hydrocephalus, extra-axial collection or mass lesion/mass effect. Vascular: No hyperdense vessel or unexpected calcification. Skull: Normal. Negative for fracture or focal lesion. Sinuses/Orbits: Mild mucosal thickening of the LEFT greater than RIGHT maxillary sinuses. Other: None. IMPRESSION: No acute intracranial abnormality. Electronically Signed   By: Meda Klinefelter M.D.   On: 02/19/2021 14:57   MR BRAIN WO CONTRAST  Result Date: 02/19/2021 CLINICAL DATA:  Neuro deficit, acute, stroke suspected. Generalized weakness and dizziness. EXAM: MRI HEAD WITHOUT CONTRAST TECHNIQUE: Multiplanar, multiecho pulse sequences of the brain and surrounding structures were obtained without intravenous contrast. COMPARISON:  Head CT same day FINDINGS: Brain: The brain has a normal appearance without evidence of malformation, atrophy, old or acute small or large vessel infarction, mass lesion, hemorrhage, hydrocephalus or extra-axial collection. Vascular: Major vessels at the base of the brain show flow. Venous sinuses appear patent. Skull and upper cervical spine: Normal. Sinuses/Orbits: Clear/normal. Other: None significant. IMPRESSION: Normal MRI of the brain. Electronically Signed   By: Paulina Fusi M.D.   On: 02/19/2021 18:23   MR THORACIC SPINE WO CONTRAST  Result Date: 02/19/2021 CLINICAL DATA:  Mid back pain.  Weakness and dizziness. EXAM: MRI THORACIC SPINE WITHOUT CONTRAST  TECHNIQUE: Multiplanar, multisequence MR imaging of the thoracic spine was performed. No intravenous contrast was administered. COMPARISON:  None. FINDINGS: Alignment:  Normal Vertebrae: Normal Cord: Thoracic cord is normal. No focal lesion. No cord compression. Paraspinal and other soft tissues: Normal Disc levels: No evidence of disc degeneration, bulge or herniation. No facet arthropathy. Insignificant superior endplate Schmorl's node at T9 without edema. IMPRESSION: Normal MRI of the thoracic spine. No cord abnormality. No abnormality seen to explain regional pain. Electronically Signed   By: Paulina Fusi M.D.   On: 02/19/2021 18:25   MR LUMBAR SPINE WO CONTRAST  Result Date: 02/19/2021 CLINICAL DATA:  Low back pain. Cauda equina syndrome. Generalized weakness. EXAM: MRI LUMBAR SPINE WITHOUT CONTRAST TECHNIQUE: Multiplanar, multisequence MR imaging of the lumbar spine was performed. No intravenous contrast was administered. COMPARISON:  None. FINDINGS: Segmentation:  5 lumbar type vertebral bodies assumed. Alignment:  Mild curvature convex to the left with the apex at L3. Vertebrae:  No fracture or focal bone lesion. Conus medullaris and cauda equina: Conus extends to the L1 level. Conus and cauda  equina appear normal. Paraspinal and other soft tissues: Negative Disc levels: L1-2: Mild noncompressive disc bulge. L2-3: Mild noncompressive disc bulge. L3-4: Mild noncompressive disc bulge. L4-5: Mild bulging of the disc. Mild facet and ligamentous hypertrophy. Mild narrowing of the lateral recesses left more than right. Some potential the left L5 nerve could be affected in this location. L5-S1: Normal interspace. IMPRESSION: Mild non-compressive disc bulges at L1-2, L2-3 and L3-4. L4-5 disc bulge more prominent towards the left. Mild facet and ligamentous hypertrophy. Mild stenosis of the left lateral recess at this level that could possibly cause compression of the left L5 nerve. Electronically Signed   By:  Paulina Fusi M.D.   On: 02/19/2021 18:27   DG Chest Port 1 View  Result Date: 02/21/2021 CLINICAL DATA:  Shortness of breath. EXAM: PORTABLE CHEST 1 VIEW COMPARISON:  February 19, 2021. FINDINGS: The heart size and mediastinal contours are within normal limits. Minimal bibasilar subsegmental atelectasis or possibly edema is noted which is slightly improved compared to prior exam. The visualized skeletal structures are unremarkable. IMPRESSION: Slightly improved bibasilar opacities as described above. Electronically Signed   By: Lupita Raider M.D.   On: 02/21/2021 08:59   ECHOCARDIOGRAM COMPLETE  Result Date: 02/20/2021    ECHOCARDIOGRAM REPORT   Patient Name:   DELYNN PURSLEY Date of Exam: 02/20/2021 Medical Rec #:  [id number]      Height:       76.0 in Accession #:    [id number]     Weight:       335.3 lb Date of Birth:  October 09, 1970      BSA:          2.759 m Patient Age:    50 years       BP:           153/130 mmHg Patient Gender: M              HR:           111 bpm. Exam Location:  Inpatient Procedure: 2D Echo Indications:    acute systolic chf  History:        Patient has no prior history of Echocardiogram examinations.                 Risk Factors:Current Smoker, Alcohol use and Hypertension.  Sonographer:    Delcie Roch RDCS Referring Phys: [id number] Deno Lunger [place] IMPRESSIONS  1. Left ventricular ejection fraction, by estimation, is 60 to 65%. The left ventricle has normal function. The left ventricle has no regional wall motion abnormalities. Left ventricular diastolic function could not be evaluated.  2. Right ventricular systolic function is normal. The right ventricular size is normal. Tricuspid regurgitation signal is inadequate for assessing PA pressure.  3. The mitral valve is grossly normal. Trivial mitral valve regurgitation. No evidence of mitral stenosis.  4. The aortic valve was not well visualized. There is mild calcification of the aortic valve. Aortic valve regurgitation is not  visualized. Mild aortic valve sclerosis is present, with no evidence of aortic valve stenosis. Comparison(s): No prior Echocardiogram. Conclusion(s)/Recommendation(s): Normal biventricular function without evidence of hemodynamically significant valvular heart disease. FINDINGS  Left Ventricle: Left ventricular ejection fraction, by estimation, is 60 to 65%. The left ventricle has normal function. The left ventricle has no regional wall motion abnormalities. The left ventricular internal cavity size was normal in size. There is  no left ventricular hypertrophy. Left ventricular diastolic function could not be evaluated. Right Ventricle: The right  ventricular size is normal. Right vetricular wall thickness was not well visualized. Right ventricular systolic function is normal. Tricuspid regurgitation signal is inadequate for assessing PA pressure. Left Atrium: Left atrial size was normal in size. Right Atrium: Right atrial size was normal in size. Pericardium: There is no evidence of pericardial effusion. Presence of pericardial fat pad. Mitral Valve: The mitral valve is grossly normal. Trivial mitral valve regurgitation. No evidence of mitral valve stenosis. Tricuspid Valve: The tricuspid valve is grossly normal. Tricuspid valve regurgitation is trivial. No evidence of tricuspid stenosis. Aortic Valve: The aortic valve was not well visualized. There is mild calcification of the aortic valve. Aortic valve regurgitation is not visualized. Mild aortic valve sclerosis is present, with no evidence of aortic valve stenosis. Pulmonic Valve: The pulmonic valve was not well visualized. Pulmonic valve regurgitation is not visualized. Aorta: The aortic root and ascending aorta are structurally normal, with no evidence of dilitation. Venous: The inferior vena cava was not well visualized. IAS/Shunts: The interatrial septum was not well visualized.  LEFT VENTRICLE PLAX 2D LVIDd:         6.00 cm LVIDs:         3.80 cm LV PW:          1.10 cm LV IVS:        1.10 cm LVOT diam:     2.80 cm LV SV:         101 LV SV Index:   37 LVOT Area:     6.16 cm  RIGHT VENTRICLE RV S prime:     15.80 cm/s TAPSE (M-mode): 2.4 cm LEFT ATRIUM           Index        RIGHT ATRIUM           Index LA diam:      4.40 cm 1.59 cm/m   RA Area:     19.00 cm LA Vol (A4C): 71.1 ml 25.77 ml/m  RA Volume:   51.10 ml  18.52 ml/m  AORTIC VALVE LVOT Vmax:   103.00 cm/s LVOT Vmean:  67.700 cm/s LVOT VTI:    0.164 m  AORTA Ao Root diam: 3.60 cm  SHUNTS Systemic VTI:  0.16 m Systemic Diam: 2.80 cm Jodelle Red MD Electronically signed by Jodelle Red MD Signature Date/Time: 02/20/2021/1:32:52 PM    Final    US Abdomen Limited RUQ (LIVER/GB)  Result Date: 02/19/2021 CLINICAL DATA:  Cirrhosis. EXAM: ULTRASOUND ABDOMEN LIMITED RIGHT UPPER QUADRANT COMPARISON:  None. FINDINGS: Gallbladder: Partially distended. No visualized gallstones. Diffuse gallbladder wall thickening at 6 mm. No sonographic Murphy sign noted by sonographer. Common bile duct: Diameter: 5 mm, normal. Liver: Heterogeneous, coarsened, and diffusely increased in parenchymal echogenicity. The liver parenchyma is difficult to penetrate. Allowing for this, no focal lesion is seen. There may be a subtle capsular nodularity. Portal vein is patent on color Doppler imaging with normal direction of blood flow towards the liver. Other: No right upper quadrant ascites. IMPRESSION: 1. Heterogeneous, coarsened, and increased hepatic parenchymal echogenicity. There may be mild capsular nodularity. Findings consistent with steatosis and possible cirrhosis. 2. Liver parenchyma is difficult to penetrate, no focal lesion is seen. 3. Mild gallbladder wall thickening is nonspecific in the setting of chronic liver disease. No gallstones. No biliary dilatation. Electronically Signed   By: Narda Rutherford M.D.   On: 02/19/2021 21:05

## 2021-02-21 NOTE — Evaluation (Signed)
Occupational Therapy Evaluation Patient Details Name: Colin Stevens MRN: [id number] DOB: 10-01-1970 Today's Date: 02/21/2021   History of Present Illness Pt adm 10/22 with acute hypoxic resp failure, etoh withdrawal and HTN urgency. PMH - HTN, etoh abuse   Clinical Impression   PTA, pt lives with spouse and reports Independence with all daily tasks without use of AD. Pt had been working as a Chartered certified accountant. Pt presents now with significant deficits in cognition, strength, coordination, sitting balance and cardiopulmonary tolerance. Pt able to demo improved mobility to sit EOB with Mod A x 1 but requires intermittent Min A to maintain sitting balance. Pt with increasing anxiety, tremors sitting EOB after plan to try standing with Stedy. Ultimately unable to attempt standing due to anxiety and bed control malfunctions (would not stay at elevated height). P's wife present and hands on to assist pt. Would like to see pt for another OT session prior to finalizing recs as withdrawal symptoms could resolve quickly. Per wife, pt would need to be able to ambulate in order to DC home safely.       Recommendations for follow up therapy are one component of a multi-disciplinary discharge planning process, led by the attending physician.  Recommendations may be updated based on patient status, additional functional criteria and insurance authorization.   Follow Up Recommendations  Other (comment) (TBD pending etoh withdrawl improvement)    Assistance Recommended at Discharge Frequent or constant Supervision/Assistance  Functional Status Assessment  Patient has had a recent decline in their functional status and demonstrates the ability to make significant improvements in function in a reasonable and predictable amount of time.  Equipment Recommendations  Other (comment) (TBD)    Recommendations for Other Services       Precautions / Restrictions Precautions Precautions: Fall Precaution Comments: monitor  O2, fearful of falling Restrictions Weight Bearing Restrictions: No      Mobility Bed Mobility Overal bed mobility: Needs Assistance Bed Mobility: Supine to Sit;Sit to Supine     Supine to sit: Mod assist;HOB elevated Sit to supine: Max assist;+2 for physical assistance;+2 for safety/equipment   General bed mobility comments: Mod A to get EOB, able to assist by pulling on bedrails. Max A x 2 to get back into bed with assist to guide trunk and B LE back into bed safely    Transfers                   General transfer comment: unable - pt anxious and bed malfunctioning (would not stay at elevated level for Stedy placement)      Balance Overall balance assessment: Needs assistance Sitting-balance support: Bilateral upper extremity supported;Feet supported Sitting balance-Leahy Scale: Poor Sitting balance - Comments: intermittent Min A to stay sitting EOB with posterior sway Postural control: Posterior lean                                 ADL either performed or assessed with clinical judgement   ADL Overall ADL's : Needs assistance/impaired Eating/Feeding: Set up;Bed level   Grooming: Bed level;Supervision/safety   Upper Body Bathing: Minimal assistance;Sitting   Lower Body Bathing: Maximal assistance;+2 for physical assistance;+2 for safety/equipment;Sit to/from stand   Upper Body Dressing : Minimal assistance;Sitting   Lower Body Dressing: Maximal assistance;+2 for physical assistance;+2 for safety/equipment;Sit to/from stand       Toileting- Architect and Hygiene: Total assistance;Bed level  General ADL Comments: Pt limited by fear of falling, tremors (exacerbated by anxiety), pain and significant weakness.     Vision Baseline Vision/History: 1 Wears glasses Ability to See in Adequate Light: 0 Adequate Patient Visual Report: No change from baseline Vision Assessment?: No apparent visual deficits     Perception      Praxis      Pertinent Vitals/Pain Pain Assessment: Faces Faces Pain Scale: Hurts little more Pain Location: B LE (primarily feet, worsens with WB) Pain Descriptors / Indicators: Grimacing;Guarding;Moaning Pain Intervention(s): Monitored during session;Limited activity within patient's tolerance     Hand Dominance Right   Extremity/Trunk Assessment Upper Extremity Assessment Upper Extremity Assessment: Overall WFL for tasks assessed   Lower Extremity Assessment Lower Extremity Assessment: Defer to PT evaluation   Cervical / Trunk Assessment Cervical / Trunk Assessment: Normal   Communication Communication Communication: No difficulties   Cognition Arousal/Alertness: Awake/alert Behavior During Therapy: WFL for tasks assessed/performed;Anxious Overall Cognitive Status: Impaired/Different from baseline Area of Impairment: Attention;Following commands;Problem solving;Awareness;Orientation;Memory;Safety/judgement                 Orientation Level: Disoriented to;Time;Situation Current Attention Level: Sustained Memory: Decreased short-term memory Following Commands: Follows one step commands with increased time Safety/Judgement: Decreased awareness of safety;Decreased awareness of deficits Awareness: Intellectual Problem Solving: Slow processing;Decreased initiation;Requires verbal cues;Requires tactile cues;Difficulty sequencing General Comments: Pt pleasant and agreeable, reports it is July 1 or 2nd, but able to recall correct month by end of session after initial education. Pt benefits from encouragement due to anxiety and fear of falling. difficulty recalling PLOF - wife assisting     General Comments  Wife present and supportive. VSS on 3 L O2. Educated on [place] use to ease anxiety though pt unable to attempt today    Exercises     Shoulder Instructions      Home Living Family/patient expects to be discharged to:: Private residence Living Arrangements:  Spouse/significant other Available Help at Discharge: Family Type of Home: House Home Access: Stairs to enter Secretary/administrator of Steps: 4 Entrance Stairs-Rails: Right;Left Home Layout: One level     Bathroom Shower/Tub: Chief Strategy Officer: Standard     Home Equipment: None          Prior Functioning/Environment Prior Level of Function : Independent/Modified Independent             Mobility Comments: no use of AD ADLs Comments: worked as Chartered certified accountant, Independent with ADLs/IADLs        OT Problem List: Decreased strength;Decreased activity tolerance;Impaired balance (sitting and/or standing);Decreased cognition;Decreased coordination;Decreased safety awareness;Cardiopulmonary status limiting activity;Pain      OT Treatment/Interventions: Self-care/ADL training;Therapeutic exercise;Energy conservation;DME and/or AE instruction;Therapeutic activities;Patient/family education;Balance training    OT Goals(Current goals can be found in the care plan section) Acute Rehab OT Goals Patient Stated Goal: to be able to walk, be more independent before going home OT Goal Formulation: With patient/family Time For Goal Achievement: 03/07/21 Potential to Achieve Goals: Good  OT Frequency: Min 2X/week   Barriers to D/C:            Co-evaluation              AM-PAC OT "6 Clicks" Daily Activity     Outcome Measure Help from another person eating meals?: A Little Help from another person taking care of personal grooming?: A Little Help from another person toileting, which includes using toliet, bedpan, or urinal?: Total Help from another person bathing (including washing, rinsing, drying)?:  A Lot Help from another person to put on and taking off regular upper body clothing?: A Little Help from another person to put on and taking off regular lower body clothing?: A Lot 6 Click Score: 14   End of Session Equipment Utilized During Treatment:  Oxygen Nurse Communication: Mobility status;Other (comment) (broken bed)  Activity Tolerance: Other (comment);Patient tolerated treatment well (limited by anxiety) Patient left: in bed;with call bell/phone within reach;with bed alarm set;with family/visitor present  OT Visit Diagnosis: Unsteadiness on feet (R26.81);Other abnormalities of gait and mobility (R26.89);Muscle weakness (generalized) (M62.81);Other symptoms and signs involving cognitive function                Time: 6301-6010 OT Time Calculation (min): 36 min Charges:  OT General Charges $OT Visit: 1 Visit OT Evaluation $OT Eval Moderate Complexity: 1 Mod OT Treatments $Self Care/Home Management : 8-22 mins  Bradd Canary, OTR/L Acute Rehab Services Office: [phone]   Lorre Munroe 02/21/2021, 2:41 PM

## 2021-02-21 NOTE — Plan of Care (Signed)
  Problem: Activity: Goal: Risk for activity intolerance will decrease Outcome: Progressing   Problem: Coping: Goal: Level of anxiety will decrease Outcome: Progressing   Problem: Elimination: Goal: Will not experience complications related to bowel motility Outcome: Progressing Goal: Will not experience complications related to urinary retention Outcome: Progressing   

## 2021-02-21 NOTE — Progress Notes (Signed)
OT Cancellation Note  Patient Details Name: RYOTT RAFFERTY MRN: [id number] DOB: 07/29/1970   Cancelled Treatment:    Reason Eval/Treat Not Completed: Other (comment) Collab with RN about plans to get pt up in chair today. Per RN, pt's wife will be in this morning and would prefer to have family present with pt in chair for safety due to etoh withdrawal. Will follow up for OT eval as schedule permits.   Lorre Munroe 02/21/2021, 8:44 AM

## 2021-02-21 NOTE — TOC Initial Note (Signed)
Transition of Care [facility]) - Initial/Assessment Note    Patient Details  Name: Colin Stevens MRN: [id number] Date of Birth: December 30, 1970  [facility]) CM/SW Contact:    Elliot Cousin, RN Phone Number: [phone] 02/21/2021, 6:27 PM  Clinical Narrative:                  HF TOC CM spoke to wife. Pt was working at [employer] but was terminated. He was in process of getting insurance. States she is trying to have him added to her plan. States Dionicia Abler has completed Medicaid application and disability. Waiting recommendations for home. Pt may need HH and DME.     Expected Discharge Plan: Home w Home Health Services Barriers to Discharge: Continued Medical Work up   Patient Goals and CMS Choice   CMS Medicare.gov Compare Post Acute Care list provided to:: Patient Represenative (must comment) (wife) Choice offered to / list presented to : Spouse  Expected Discharge Plan and Services Expected Discharge Plan: Home w Home Health Services In-house Referral: Clinical Social Work Discharge Planning Services: CM Consult Post Acute Care Choice: Home Health                                        Prior Living Arrangements/Services   Lives with:: Spouse Patient language and need for interpreter reviewed:: Yes        Need for Family Participation in Patient Care: Yes (Comment) Care giver support system in place?: Yes (comment)      Activities of Daily Living Home Assistive Devices/Equipment: None ADL Screening (condition at time of admission) Patient's cognitive ability adequate to safely complete daily activities?: No Is the patient deaf or have difficulty hearing?: No Does the patient have difficulty seeing, even when wearing glasses/contacts?: No Does the patient have difficulty concentrating, remembering, or making decisions?: No Patient able to express need for assistance with ADLs?: No Does the patient have difficulty dressing or bathing?:  Yes Independently performs ADLs?: No Communication: Needs assistance Is this a change from baseline?: Change from baseline, expected to last >3 days Dressing (OT): Needs assistance Is this a change from baseline?: Change from baseline, expected to last >3 days Grooming: Needs assistance Is this a change from baseline?: Change from baseline, expected to last >3 days Feeding: Needs assistance Is this a change from baseline?: Change from baseline, expected to last >3 days Bathing: Needs assistance Is this a change from baseline?: Change from baseline, expected to last >3 days Toileting: Needs assistance Is this a change from baseline?: Change from baseline, expected to last >3days In/Out Bed: Needs assistance Is this a change from baseline?: Change from baseline, expected to last >3 days Walks in Home: Needs assistance Is this a change from baseline?: Change from baseline, expected to last >3 days Does the patient have difficulty walking or climbing stairs?: Yes Weakness of Legs: Both Weakness of Arms/Hands: Both  Permission Sought/Granted Permission sought to share information with : Case Manager, Family Supports, Magazine features editor, PCP Permission granted to share information with : Yes, Verbal Permission Granted  Share Information with NAME: Giovanni Biby  Permission granted to share info w AGENCY: Home Health, DME  Permission granted to share info w Relationship: wife  Permission granted to share info w Contact Information: [phone]  Emotional Assessment       Orientation: : Oriented to Self  Psych Involvement: No (comment)  Admission diagnosis:  Cirrhosis (HCC) [K74.60] Acute respiratory failure with hypoxia (HCC) [J96.01] Alcohol withdrawal delirium, acute, hypoactive (HCC) [F10.231] Patient Active Problem List   Diagnosis Date Noted   Acute respiratory failure with hypoxia (HCC) 02/19/2021   Alcohol withdrawal delirium, acute, hypoactive (HCC)  02/19/2021   Hypertensive urgency 02/19/2021   Nicotine dependence, cigarettes, uncomplicated 02/19/2021   Fall at home, initial encounter 02/19/2021   Essential hypertension 02/19/2021   Elevated troponin level not due myocardial infarction 02/19/2021   PCP:  Clayborn Heron, MD Pharmacy:   [facility] [address], [place] - [address] [place] [address] [place] [address] [place] [address] Phone: [phone] Fax: [phone]     Social Determinants of Health (SDOH) Interventions    Readmission Risk Interventions No flowsheet data found.

## 2021-02-21 NOTE — Progress Notes (Signed)
Heart Failure Nurse Navigator Progress Note  Following for HV TOC readiness this admission. Pt pleasantly confused at this time. AO x 1-self only. Spouse at bedside. Spouse states he drinks 6-20oz beer daily x 22+ years.   EF 60-65%  Will re-evaluate Wednesday. Admission primarily related to ETOH, need plan to continue alcohol cessation.  Ozella Rocks, MSN, RN Heart Failure Nurse Navigator [phone]

## 2021-02-22 LAB — COMPREHENSIVE METABOLIC PANEL
ALT: 22 U/L (ref 0–44)
AST: 21 U/L (ref 15–41)
Albumin: 3.4 g/dL — ABNORMAL LOW (ref 3.5–5.0)
Alkaline Phosphatase: 43 U/L (ref 38–126)
Anion gap: 10 (ref 5–15)
BUN: 14 mg/dL (ref 6–20)
CO2: 30 mmol/L (ref 22–32)
Calcium: 9 mg/dL (ref 8.9–10.3)
Chloride: 92 mmol/L — ABNORMAL LOW (ref 98–111)
Creatinine, Ser: 0.67 mg/dL (ref 0.61–1.24)
GFR, Estimated: >60 mL/min (ref >60)
Glucose, Bld: 91 mg/dL (ref 70–99)
Potassium: 4.2 mmol/L (ref 3.5–5.1)
Sodium: 132 mmol/L — ABNORMAL LOW (ref 135–145)
Total Bilirubin: 1.2 mg/dL (ref 0.3–1.2)
Total Protein: 6.2 g/dL — ABNORMAL LOW (ref 6.5–8.1)

## 2021-02-22 LAB — CBC WITH DIFFERENTIAL/PLATELET
Abs Immature Granulocytes: 0.03 10*3/uL (ref 0.00–0.07)
Basophils Absolute: 0.1 10*3/uL (ref 0.0–0.1)
Basophils Relative: 1 %
Eosinophils Absolute: 0.1 10*3/uL (ref 0.0–0.5)
Eosinophils Relative: 1 %
HCT: 44.1 % (ref 39.0–52.0)
Hemoglobin: 14.5 g/dL (ref 13.0–17.0)
Immature Granulocytes: 0 %
Lymphocytes Relative: 6 %
Lymphs Abs: 0.7 10*3/uL (ref 0.7–4.0)
MCH: 35.4 pg — ABNORMAL HIGH (ref 26.0–34.0)
MCHC: 32.9 g/dL (ref 30.0–36.0)
MCV: 107.6 fL — ABNORMAL HIGH (ref 80.0–100.0)
Monocytes Absolute: 0.8 10*3/uL (ref 0.1–1.0)
Monocytes Relative: 8 %
Neutro Abs: 8.5 10*3/uL — ABNORMAL HIGH (ref 1.7–7.7)
Neutrophils Relative %: 84 %
Platelets: 137 10*3/uL — ABNORMAL LOW (ref 150–400)
RBC: 4.1 MIL/uL — ABNORMAL LOW (ref 4.22–5.81)
RDW: 12.6 % (ref 11.5–15.5)
WBC: 10.2 10*3/uL (ref 4.0–10.5)
nRBC: 0 % (ref 0.0–0.2)

## 2021-02-22 LAB — MAGNESIUM: Magnesium: 1.7 mg/dL (ref 1.7–2.4)

## 2021-02-22 LAB — BRAIN NATRIURETIC PEPTIDE: B Natriuretic Peptide: 251.7 pg/mL — ABNORMAL HIGH (ref 0.0–100.0)

## 2021-02-22 LAB — UREA NITROGEN, URINE: Urea Nitrogen, Ur: 595 mg/dL

## 2021-02-22 MED ORDER — FUROSEMIDE 10 MG/ML IJ SOLN
40.0000 mg | Freq: Every day | INTRAMUSCULAR | Status: DC
  Administered 2021-02-22: 40 mg via INTRAVENOUS
  Filled 2021-02-22: qty 4

## 2021-02-22 MED ORDER — VITAMIN B-12 1000 MCG PO TABS
1000.0000 ug | ORAL_TABLET | Freq: Every day | ORAL | Status: DC
  Administered 2021-02-28 – 2021-03-01 (×2): 1000 ug via ORAL
  Filled 2021-02-22 (×2): qty 1

## 2021-02-22 MED ORDER — MAGNESIUM SULFATE IN D5W 1-5 GM/100ML-% IV SOLN
1.0000 g | Freq: Once | INTRAVENOUS | Status: AC
  Administered 2021-02-22: 1 g via INTRAVENOUS
  Filled 2021-02-22: qty 100

## 2021-02-22 MED ORDER — NYSTATIN 100000 UNIT/ML MT SUSP
5.0000 mL | Freq: Four times a day (QID) | OROMUCOSAL | Status: DC
  Administered 2021-02-22 – 2021-03-01 (×29): 500000 [IU] via OROMUCOSAL
  Filled 2021-02-22 (×29): qty 5

## 2021-02-22 MED ORDER — CHLORDIAZEPOXIDE HCL 5 MG PO CAPS
5.0000 mg | ORAL_CAPSULE | Freq: Once | ORAL | Status: AC
  Administered 2021-02-22: 5 mg via ORAL
  Filled 2021-02-22: qty 1

## 2021-02-22 MED ORDER — CHLORDIAZEPOXIDE HCL 5 MG PO CAPS
25.0000 mg | ORAL_CAPSULE | Freq: Three times a day (TID) | ORAL | Status: DC
  Administered 2021-02-22: 25 mg via ORAL
  Filled 2021-02-22: qty 5

## 2021-02-22 MED ORDER — CLONIDINE HCL 0.1 MG PO TABS
0.1000 mg | ORAL_TABLET | Freq: Two times a day (BID) | ORAL | Status: DC
  Administered 2021-02-22 – 2021-02-26 (×10): 0.1 mg via ORAL
  Filled 2021-02-22 (×10): qty 1

## 2021-02-22 MED ORDER — CHLORDIAZEPOXIDE HCL 5 MG PO CAPS: 20.0000 mg | ORAL_CAPSULE | Freq: Three times a day (TID) | ORAL | Status: DC

## 2021-02-22 NOTE — Progress Notes (Signed)
? ?  Inpatient Rehab Admissions Coordinator : ? ?Per therapy recommendations, patient was screened for CIR candidacy by Josiephine Simao RN MSN.  At this time patient appears to be a potential candidate for CIR. I will place a rehab consult per protocol for full assessment. Please call me with any questions. ? ?Divine Hansley RN MSN ?Admissions Coordinator ?[phone] ?  ?

## 2021-02-22 NOTE — Progress Notes (Signed)
PROGRESS NOTE                                                                                                                                                                                                             Patient Demographics:    Colin Stevens, is a 50 y.o. male, DOB - 1970-09-29, SEL:[id number]  Outpatient Primary MD for the patient is Rankins, Fanny Dance, MD    LOS - 3  Admit date - 02/19/2021    Chief Complaint  Patient presents with   Shortness of Breath       Brief Narrative (HPI from H&P)  50 year old male with past medical history of hypertension, nicotine dependence, alcohol abuse who presents to [facility] emergency department via EMS with shortness of breath and DTs.   Subjective:   Patient in bed, appears comfortable, denies any headache, no fever, no chest pain or pressure, no shortness of breath , no abdominal pain. No new focal weakness.    Assessment  & Plan :     Acute Hypoxic Resp. Failure due to Acute on Chronic CHF -echocardiogram with EF 60% likely had mild acute on chronic diastolic CHF which has resolved post IV Lasix, continue beta-blocker.  Monitor closely.   2.  DTS - Librium dose increased, continue with CIWA protocol, added Catapres, also has borderline B12 deficiency and placed on replacement, also on thiamine and folic acid , seen by neuro.Marland Kitchen  MRI brain unremarkable.  If gets any worse may require Precedex in ICU.  3.  Hypertensive urgency.  Treating DTs, medications adjusted for blood pressure.  Monitor.  4.  Fall at home.  MRI brain along with spine unremarkable.  Mild L5 left-sided nerve compression which will be monitored, PT OT once DTs are better.  5. Ongoing nicotine dependence and alcohol abuse.  Counseled to quit.  6.  Obesity.  BMI of 40.  Follow with PCP for weight loss.  7.  Hypomagnesemia.  Replaced.  8.  Ultrasound suggestive of cirrhosis with  thrombocytopenia on ultrasound.  Likely alcoholic cirrhosis, thrombocytopenia likely due to it.  Negative acute hepatitis panel.  9.  Hyponatremia.  Monitor urine electrolytes.  Hold further Lasix and repeat electrolytes in the morning.      Condition -   Guarded  Family Communication  :  Wife bedside 02/20/21, called [phone]  on 02/21/21 at 11.57 am > 10 rings, called and updated over the phone in the spittle room.  Code Status :  Full  Consults  :  Neuro  PUD Prophylaxis : PPI   Procedures  :     TTE - 1. Left ventricular ejection fraction, by estimation, is 60 to 65%. The left ventricle has normal function. The left ventricle has no regional wall motion abnormalities. Left ventricular diastolic function could not be evaluated.  2. Right ventricular systolic function is normal. The right ventricular size is normal. Tricuspid regurgitation signal is inadequate for assessing PA pressure.  3. The mitral valve is grossly normal. Trivial mitral valve regurgitation. No evidence of mitral stenosis.  4. The aortic valve was not well visualized. There is mild calcification of the aortic valve. Aortic valve regurgitation is not visualized. Mild aortic valve sclerosis is present, with no evidence of aortic valve stenosis.  RUQ [place] - Cirrhosis  MRI Brain and Spine - non acute MRI brain, MRI spine showing similar L-spine disease with possible mild L5 nerve compression on the left side.       Disposition Plan  :    Status is: Inpatient  Remains inpatient appropriate because: Severe DTs   DVT Prophylaxis  :       Lab Results  Component Value Date   PLT 137 (L) 02/22/2021    Diet :  Diet Order             Diet full liquid Room service appropriate? Yes; Fluid consistency: Thin  Diet effective now                    Inpatient Medications  Scheduled Meds:  chlordiazePOXIDE  25 mg Oral TID   cloNIDine  0.1 mg Oral BID   cyanocobalamin  1,000 mcg Subcutaneous Daily    enoxaparin (LOVENOX) injection  75 mg Subcutaneous Q24H   folic acid  1 mg Oral Daily   lisinopril  5 mg Oral Daily   LORazepam  0-4 mg Intravenous Q12H   metoprolol tartrate  100 mg Oral BID   multivitamin with minerals  1 tablet Oral Daily   nicotine  21 mg Transdermal Daily   nystatin  5 mL Mouth/Throat QID   pantoprazole  40 mg Oral Daily   [START ON 02/24/2021] thiamine  100 mg Oral Daily   Or   [START ON 02/24/2021] thiamine  100 mg Intravenous Daily   thiamine  100 mg Oral Daily   [START ON 02/28/2021] vitamin B-12  1,000 mcg Oral Daily   Continuous Infusions:  magnesium sulfate bolus IVPB     PRN Meds:.acetaminophen **OR** acetaminophen, albuterol, loperamide, LORazepam **OR** LORazepam, [DISCONTINUED] ondansetron **OR** ondansetron (ZOFRAN) IV, polyethylene glycol  Antibiotics  :    Anti-infectives (From admission, onward)    None        Time Spent in minutes  30   Susa Raring M.D on 02/22/2021 at 12:46 PM  To page go to [email or web address]   [facility] -  Office  [phone]  See all Orders from today for further details    Objective:   Vitals:   02/21/21 2350 02/22/21 0337 02/22/21 0400 02/22/21 0727  BP: 138/77 (!) 145/75 (!) 145/75 (!) 148/83  Pulse: 94 100 100 (!) 117  Resp: 20 18  19   Temp: 98 F (36.7 C) 98.4 F (36.9 C)  98.5 F (36.9 C)  TempSrc: Axillary Oral  Oral  SpO2: 92% 90%  91%  Weight:  )  475.2 kg    Height:        Wt Readings from Last 3 Encounters:  02/22/21 (!) 475.2 kg     Intake/Output Summary (Last 24 hours) at 02/22/2021 1246 Last data filed at 02/22/2021 0819 Gross per 24 hour  Intake 50 ml  Output 1075 ml  Net -1025 ml     Physical Exam  Awake Alert x 2, No new F.N deficits, DTs [place].AT,PERRAL Supple Neck, No JVD,   Symmetrical Chest wall movement, Good air movement bilaterally, CTAB RRR,No Gallops, Rubs or new Murmurs,  +ve B.Sounds, Abd Soft, No tenderness,   No Cyanosis, Clubbing or edema,            Data Review:    CBC Recent Labs  Lab 02/19/21 1256 02/19/21 2201 02/20/21 0110 02/21/21 0320 02/22/21 0214  WBC 8.9  --  6.3 12.1* 10.2  HGB 14.8 13.9 14.2 14.9 14.5  HCT 43.6 41.0 41.4 44.6 44.1  PLT 146*  --  143* 150 137*  MCV 103.8*  --  103.2* 106.7* 107.6*  MCH 35.2*  --  35.4* 35.6* 35.4*  MCHC 33.9  --  34.3 33.4 32.9  RDW 12.5  --  12.4 12.8 12.6  LYMPHSABS  --   --  0.2* 0.6* 0.7  MONOABS  --   --  0.3 0.8 0.8  EOSABS  --   --  0.0 0.0 0.1  BASOSABS  --   --  0.0 0.0 0.1    Recent Labs  Lab 02/19/21 1256 02/19/21 1327 02/19/21 1444 02/19/21 1951 02/19/21 2100 02/19/21 2201 02/19/21 2259 02/20/21 0110 02/21/21 0320 02/22/21 0214 02/22/21 0529  NA 130*  --   --   --   --  128*  --  133* 131* 132*  --   K 4.5  --   --   --   --  5.0  --  4.4 4.5 4.2  --   CL 91*  --   --   --   --   --   --  93* 92* 92*  --   CO2 28  --   --   --   --   --   --  31 32 30  --   GLUCOSE 97  --   --   --   --   --   --  126* 99 91  --   BUN 6  --   --   --   --   --   --  --   CREATININE 0.68  --   --   --   --   --   --  0.68 0.68 0.67  --   CALCIUM 9.6  --   --   --   --   --   --  9.2 9.0 9.0  --   AST  --  27  --   --   --   --   --  --   ALT  --  29  --   --   --   --   --  --   ALKPHOS  --  52  --   --   --   --   --  47 46 43  --   BILITOT  --  0.9  --   --   --   --   --  0.9 1.0 1.2  --  ALBUMIN  --  3.9  --   --   --   --   --  3.7 3.7 3.4*  --   MG  --   --   --   --   --   --   --  1.4* 2.2 1.7  --   LATICACIDVEN  --  0.7  --   --   --   --   --   --   --   --   --   INR  --   --   --  1.0  --   --   --   --   --   --   --   TSH  --   --   --   --   --   --  3.067  --   --   --   --   AMMONIA  --   --   --   --  30  --   --   --   --   --   --   BNP  --   --  698.0*  --   --   --   --   --  291.1*  --  251.7*     ------------------------------------------------------------------------------------------------------------------ No results for input(s): CHOL, HDL, LDLCALC, TRIG, CHOLHDL, LDLDIRECT in the last 72 hours.  No results found for: HGBA1C ------------------------------------------------------------------------------------------------------------------ Recent Labs    02/19/21 2259  TSH 3.067    Cardiac Enzymes No results for input(s): CKMB, TROPONINI, MYOGLOBIN in the last 168 hours.  Invalid input(s): CK ------------------------------------------------------------------------------------------------------------------    Component Value Date/Time   BNP 251.7 (H) 02/22/2021 0529     Radiology Reports DG Chest 2 View  Result Date: 02/19/2021 CLINICAL DATA:  50 year old male with shortness of breath. EXAM: CHEST - 2 VIEW COMPARISON:  03/04/2014 FINDINGS: The mediastinal contours are within normal limits. Mild cardiomegaly. Low lung volumes. My cephalization of pulmonary vasculature. Mild diffuse hazy pulmonary opacities with a bibasilar predominance. No pneumothorax or evidence of significant pleural effusion. No acute osseous abnormality. IMPRESSION: Mild pulmonary edema. Electronically Signed   By: Marliss Coots M.D.   On: 02/19/2021 13:59   CT Head Wo Contrast  Result Date: 02/19/2021 CLINICAL DATA:  Neuro deficit, acute, stroke suspected EXAM: CT HEAD WITHOUT CONTRAST TECHNIQUE: Contiguous axial images were obtained from the base of the skull through the vertex without intravenous contrast. COMPARISON:  None. FINDINGS: Brain: No evidence of acute infarction, hemorrhage, hydrocephalus, extra-axial collection or mass lesion/mass effect. Vascular: No hyperdense vessel or unexpected calcification. Skull: Normal. Negative for fracture or focal lesion. Sinuses/Orbits: Mild mucosal thickening of the LEFT greater than RIGHT maxillary sinuses. Other: None. IMPRESSION: No acute intracranial  abnormality. Electronically Signed   By: Meda Klinefelter M.D.   On: 02/19/2021 14:57   MR BRAIN WO CONTRAST  Result Date: 02/19/2021 CLINICAL DATA:  Neuro deficit, acute, stroke suspected. Generalized weakness and dizziness. EXAM: MRI HEAD WITHOUT CONTRAST TECHNIQUE: Multiplanar, multiecho pulse sequences of the brain and surrounding structures were obtained without intravenous contrast. COMPARISON:  Head CT same day FINDINGS: Brain: The brain has a normal appearance without evidence of malformation, atrophy, old or acute small or large vessel infarction, mass lesion, hemorrhage, hydrocephalus or extra-axial collection. Vascular: Major vessels at the base of the brain show flow. Venous sinuses appear patent. Skull and upper cervical spine: Normal. Sinuses/Orbits: Clear/normal. Other: None significant. IMPRESSION: Normal MRI of the brain. Electronically Signed   By: Loraine Leriche  Shogry M.D.   On: 02/19/2021 18:23   MR THORACIC SPINE WO CONTRAST  Result Date: 02/19/2021 CLINICAL DATA:  Mid back pain.  Weakness and dizziness. EXAM: MRI THORACIC SPINE WITHOUT CONTRAST TECHNIQUE: Multiplanar, multisequence MR imaging of the thoracic spine was performed. No intravenous contrast was administered. COMPARISON:  None. FINDINGS: Alignment:  Normal Vertebrae: Normal Cord: Thoracic cord is normal. No focal lesion. No cord compression. Paraspinal and other soft tissues: Normal Disc levels: No evidence of disc degeneration, bulge or herniation. No facet arthropathy. Insignificant superior endplate Schmorl's node at T9 without edema. IMPRESSION: Normal MRI of the thoracic spine. No cord abnormality. No abnormality seen to explain regional pain. Electronically Signed   By: Paulina Fusi M.D.   On: 02/19/2021 18:25   MR LUMBAR SPINE WO CONTRAST  Result Date: 02/19/2021 CLINICAL DATA:  Low back pain. Cauda equina syndrome. Generalized weakness. EXAM: MRI LUMBAR SPINE WITHOUT CONTRAST TECHNIQUE: Multiplanar, multisequence MR  imaging of the lumbar spine was performed. No intravenous contrast was administered. COMPARISON:  None. FINDINGS: Segmentation:  5 lumbar type vertebral bodies assumed. Alignment:  Mild curvature convex to the left with the apex at L3. Vertebrae:  No fracture or focal bone lesion. Conus medullaris and cauda equina: Conus extends to the L1 level. Conus and cauda equina appear normal. Paraspinal and other soft tissues: Negative Disc levels: L1-2: Mild noncompressive disc bulge. L2-3: Mild noncompressive disc bulge. L3-4: Mild noncompressive disc bulge. L4-5: Mild bulging of the disc. Mild facet and ligamentous hypertrophy. Mild narrowing of the lateral recesses left more than right. Some potential the left L5 nerve could be affected in this location. L5-S1: Normal interspace. IMPRESSION: Mild non-compressive disc bulges at L1-2, L2-3 and L3-4. L4-5 disc bulge more prominent towards the left. Mild facet and ligamentous hypertrophy. Mild stenosis of the left lateral recess at this level that could possibly cause compression of the left L5 nerve. Electronically Signed   By: Paulina Fusi M.D.   On: 02/19/2021 18:27   DG Chest Port 1 View  Result Date: 02/21/2021 CLINICAL DATA:  Shortness of breath. EXAM: PORTABLE CHEST 1 VIEW COMPARISON:  February 19, 2021. FINDINGS: The heart size and mediastinal contours are within normal limits. Minimal bibasilar subsegmental atelectasis or possibly edema is noted which is slightly improved compared to prior exam. The visualized skeletal structures are unremarkable. IMPRESSION: Slightly improved bibasilar opacities as described above. Electronically Signed   By: Lupita Raider M.D.   On: 02/21/2021 08:59   ECHOCARDIOGRAM COMPLETE  Result Date: 02/20/2021    ECHOCARDIOGRAM REPORT   Patient Name:   KASE SHUGHART Date of Exam: 02/20/2021 Medical Rec #:  [id number]      Height:       76.0 in Accession #:    [id number]     Weight:       335.3 lb Date of Birth:  1970/10/06      BSA:           2.759 m Patient Age:    50 years       BP:           153/130 mmHg Patient Gender: M              HR:           111 bpm. Exam Location:  Inpatient Procedure: 2D Echo Indications:    acute systolic chf  History:        Patient has no prior history of Echocardiogram examinations.  Risk Factors:Current Smoker, Alcohol use and Hypertension.  Sonographer:    Delcie Roch RDCS Referring Phys: [id number] Deno Lunger SHALHOUB IMPRESSIONS  1. Left ventricular ejection fraction, by estimation, is 60 to 65%. The left ventricle has normal function. The left ventricle has no regional wall motion abnormalities. Left ventricular diastolic function could not be evaluated.  2. Right ventricular systolic function is normal. The right ventricular size is normal. Tricuspid regurgitation signal is inadequate for assessing PA pressure.  3. The mitral valve is grossly normal. Trivial mitral valve regurgitation. No evidence of mitral stenosis.  4. The aortic valve was not well visualized. There is mild calcification of the aortic valve. Aortic valve regurgitation is not visualized. Mild aortic valve sclerosis is present, with no evidence of aortic valve stenosis. Comparison(s): No prior Echocardiogram. Conclusion(s)/Recommendation(s): Normal biventricular function without evidence of hemodynamically significant valvular heart disease. FINDINGS  Left Ventricle: Left ventricular ejection fraction, by estimation, is 60 to 65%. The left ventricle has normal function. The left ventricle has no regional wall motion abnormalities. The left ventricular internal cavity size was normal in size. There is  no left ventricular hypertrophy. Left ventricular diastolic function could not be evaluated. Right Ventricle: The right ventricular size is normal. Right vetricular wall thickness was not well visualized. Right ventricular systolic function is normal. Tricuspid regurgitation signal is inadequate for assessing PA pressure. Left  Atrium: Left atrial size was normal in size. Right Atrium: Right atrial size was normal in size. Pericardium: There is no evidence of pericardial effusion. Presence of pericardial fat pad. Mitral Valve: The mitral valve is grossly normal. Trivial mitral valve regurgitation. No evidence of mitral valve stenosis. Tricuspid Valve: The tricuspid valve is grossly normal. Tricuspid valve regurgitation is trivial. No evidence of tricuspid stenosis. Aortic Valve: The aortic valve was not well visualized. There is mild calcification of the aortic valve. Aortic valve regurgitation is not visualized. Mild aortic valve sclerosis is present, with no evidence of aortic valve stenosis. Pulmonic Valve: The pulmonic valve was not well visualized. Pulmonic valve regurgitation is not visualized. Aorta: The aortic root and ascending aorta are structurally normal, with no evidence of dilitation. Venous: The inferior vena cava was not well visualized. IAS/Shunts: The interatrial septum was not well visualized.  LEFT VENTRICLE PLAX 2D LVIDd:         6.00 cm LVIDs:         3.80 cm LV PW:         1.10 cm LV IVS:        1.10 cm LVOT diam:     2.80 cm LV SV:         101 LV SV Index:   37 LVOT Area:     6.16 cm  RIGHT VENTRICLE RV S prime:     15.80 cm/s TAPSE (M-mode): 2.4 cm LEFT ATRIUM           Index        RIGHT ATRIUM           Index LA diam:      4.40 cm 1.59 cm/m   RA Area:     19.00 cm LA Vol (A4C): 71.1 ml 25.77 ml/m  RA Volume:   51.10 ml  18.52 ml/m  AORTIC VALVE LVOT Vmax:   103.00 cm/s LVOT Vmean:  67.700 cm/s LVOT VTI:    0.164 m  AORTA Ao Root diam: 3.60 cm  SHUNTS Systemic VTI:  0.16 m Systemic Diam: 2.80 cm Jodelle Red MD Electronically signed by  Jodelle Red MD Signature Date/Time: 02/20/2021/1:32:52 PM    Final    US Abdomen Limited RUQ (LIVER/GB)  Result Date: 02/19/2021 CLINICAL DATA:  Cirrhosis. EXAM: ULTRASOUND ABDOMEN LIMITED RIGHT UPPER QUADRANT COMPARISON:  None. FINDINGS: Gallbladder:  Partially distended. No visualized gallstones. Diffuse gallbladder wall thickening at 6 mm. No sonographic Murphy sign noted by sonographer. Common bile duct: Diameter: 5 mm, normal. Liver: Heterogeneous, coarsened, and diffusely increased in parenchymal echogenicity. The liver parenchyma is difficult to penetrate. Allowing for this, no focal lesion is seen. There may be a subtle capsular nodularity. Portal vein is patent on color Doppler imaging with normal direction of blood flow towards the liver. Other: No right upper quadrant ascites. IMPRESSION: 1. Heterogeneous, coarsened, and increased hepatic parenchymal echogenicity. There may be mild capsular nodularity. Findings consistent with steatosis and possible cirrhosis. 2. Liver parenchyma is difficult to penetrate, no focal lesion is seen. 3. Mild gallbladder wall thickening is nonspecific in the setting of chronic liver disease. No gallstones. No biliary dilatation. Electronically Signed   By: Narda Rutherford M.D.   On: 02/19/2021 21:05

## 2021-02-22 NOTE — Progress Notes (Signed)
Physical Therapy Treatment Patient Details Name: Colin Stevens MRN: [id number] DOB: 09/07/1970 Today's Date: 02/22/2021   History of Present Illness Pt adm 10/22 with acute hypoxic resp failure, etoh withdrawal and HTN urgency. PMH - HTN, etoh abuse    PT Comments    Pt with slow progress but continues to have significant deficits in mobility due to weakness, anxiety/fear of falling, body habitus, and decreased cognition. If cognition improves expect progress will accelerate. Recommend initiating possible inpatient rehab.   Recommendations for follow up therapy are one component of a multi-disciplinary discharge planning process, led by the attending physician.  Recommendations may be updated based on patient status, additional functional criteria and insurance authorization.  Follow Up Recommendations  Acute inpatient rehab (3hours/day)     Assistance Recommended at Discharge Frequent or constant Supervision/Assistance  Equipment Recommendations  Wheelchair cushion (measurements PT);Wheelchair (measurements PT);Other (comment) (hoyer lift)    Recommendations for Other Services Rehab consult     Precautions / Restrictions Precautions Precautions: Fall Precaution Comments: monitor O2, fearful of falling     Mobility  Bed Mobility Overal bed mobility: Needs Assistance Bed Mobility: Supine to Sit;Sit to Supine     Supine to sit: +2 for safety/equipment;Max assist Sit to supine: +2 for physical assistance;Total assist   General bed mobility comments: Assist for all aspects. Used maxislide sheets under bed pad to ease scooting to EOB    Transfers Overall transfer level: Needs assistance Equipment used: Ambulation equipment used Transfers: Sit to/from Stand Sit to Stand: +2 safety/equipment;Mod assist;From elevated surface           General transfer comment: Used bed pad under hips to bring hips up and for balance. Pt tremulous in standing. Transfer via Lift  Equipment: Stedy  Ambulation/Gait                 Stairs             Wheelchair Mobility    Modified Rankin (Stroke Patients Only)       Balance Overall balance assessment: Needs assistance Sitting-balance support: Bilateral upper extremity supported;Feet supported Sitting balance-Leahy Scale: Poor Sitting balance - Comments: Min to mod assist to sit EOB with pt leaning posteriorly despite verbal/tactile cues. With Stedy pt could maintain with min guard Postural control: Posterior lean Standing balance support: Bilateral upper extremity supported Standing balance-Leahy Scale: Poor Standing balance comment: Stood x 15 sec with Stedy with +2 mod assist. Pt with flexed knees/hips/trunk. Pt tremulous                            Cognition Arousal/Alertness: Awake/alert Behavior During Therapy: Anxious Overall Cognitive Status: Impaired/Different from baseline Area of Impairment: Attention;Following commands;Problem solving;Awareness;Orientation;Memory;Safety/judgement                 Orientation Level: Disoriented to;Time;Situation Current Attention Level: Sustained Memory: Decreased short-term memory Following Commands: Follows one step commands with increased time Safety/Judgement: Decreased awareness of safety;Decreased awareness of deficits Awareness: Intellectual Problem Solving: Slow processing;Decreased initiation;Requires verbal cues;Requires tactile cues;Difficulty sequencing General Comments: Pt with incr anxiety with mobility. Very fearful of falling        Exercises      General Comments General comments (skin integrity, edema, etc.): Wife present. VSS on RA      Pertinent Vitals/Pain Pain Assessment: Faces Faces Pain Scale: Hurts little more Pain Location: B LE (primarily feet, worsens with WB) Pain Descriptors / Indicators: Grimacing;Guarding;Moaning Pain Intervention(s): Limited activity within  patient's  tolerance;Repositioned    Home Living                          Prior Function            PT Goals (current goals can now be found in the care plan section) Acute Rehab PT Goals Patient Stated Goal: not stated Progress towards PT goals: Progressing toward goals    Frequency    Min 3X/week      PT Plan Discharge plan needs to be updated    Co-evaluation              AM-PAC PT "6 Clicks" Mobility   Outcome Measure  Help needed turning from your back to your side while in a flat bed without using bedrails?: Total Help needed moving from lying on your back to sitting on the side of a flat bed without using bedrails?: Total Help needed moving to and from a bed to a chair (including a wheelchair)?: Total Help needed standing up from a chair using your arms (e.g., wheelchair or bedside chair)?: Total Help needed to walk in hospital room?: Total Help needed climbing 3-5 steps with a railing? : Total 6 Click Score: 6    End of Session   Activity Tolerance: Patient tolerated treatment well Patient left: in bed;with call bell/phone within reach;with bed alarm set;with family/visitor present Nurse Communication: Mobility status PT Visit Diagnosis: Other abnormalities of gait and mobility (R26.89);Muscle weakness (generalized) (M62.81);Other symptoms and signs involving the nervous system (R29.898)     Time: 6644-0347 PT Time Calculation (min) (ACUTE ONLY): 23 min  Charges:  $Therapeutic Activity: 23-37 mins                     [facility] PT Acute Rehabilitation Services Pager [phone] Office [phone]    Angelina Ok [facility] 02/22/2021, 1:24 PM

## 2021-02-23 LAB — CBC WITH DIFFERENTIAL/PLATELET
Abs Immature Granulocytes: 0.02 10*3/uL (ref 0.00–0.07)
Basophils Absolute: 0 10*3/uL (ref 0.0–0.1)
Basophils Relative: 0 %
Eosinophils Absolute: 0.1 10*3/uL (ref 0.0–0.5)
Eosinophils Relative: 1 %
HCT: 43.2 % (ref 39.0–52.0)
Hemoglobin: 14.3 g/dL (ref 13.0–17.0)
Immature Granulocytes: 0 %
Lymphocytes Relative: 6 %
Lymphs Abs: 0.6 10*3/uL — ABNORMAL LOW (ref 0.7–4.0)
MCH: 34.3 pg — ABNORMAL HIGH (ref 26.0–34.0)
MCHC: 33.1 g/dL (ref 30.0–36.0)
MCV: 103.6 fL — ABNORMAL HIGH (ref 80.0–100.0)
Monocytes Absolute: 0.9 10*3/uL (ref 0.1–1.0)
Monocytes Relative: 9 %
Neutro Abs: 8.5 10*3/uL — ABNORMAL HIGH (ref 1.7–7.7)
Neutrophils Relative %: 84 %
Platelets: 151 10*3/uL (ref 150–400)
RBC: 4.17 MIL/uL — ABNORMAL LOW (ref 4.22–5.81)
RDW: 12.3 % (ref 11.5–15.5)
WBC: 10.1 10*3/uL (ref 4.0–10.5)
nRBC: 0 % (ref 0.0–0.2)

## 2021-02-23 LAB — COMPREHENSIVE METABOLIC PANEL
ALT: 24 U/L (ref 0–44)
AST: 21 U/L (ref 15–41)
Albumin: 3.4 g/dL — ABNORMAL LOW (ref 3.5–5.0)
Alkaline Phosphatase: 45 U/L (ref 38–126)
Anion gap: 8 (ref 5–15)
BUN: 14 mg/dL (ref 6–20)
CO2: 33 mmol/L — ABNORMAL HIGH (ref 22–32)
Calcium: 9.1 mg/dL (ref 8.9–10.3)
Chloride: 89 mmol/L — ABNORMAL LOW (ref 98–111)
Creatinine, Ser: 0.65 mg/dL (ref 0.61–1.24)
GFR, Estimated: >60 mL/min (ref >60)
Glucose, Bld: 97 mg/dL (ref 70–99)
Potassium: 4.1 mmol/L (ref 3.5–5.1)
Sodium: 130 mmol/L — ABNORMAL LOW (ref 135–145)
Total Bilirubin: 1.7 mg/dL — ABNORMAL HIGH (ref 0.3–1.2)
Total Protein: 6.5 g/dL (ref 6.5–8.1)

## 2021-02-23 LAB — SODIUM, URINE, RANDOM: Sodium, Ur: 123 mmol/L

## 2021-02-23 LAB — CREATININE, URINE, RANDOM: Creatinine, Urine: 165.42 mg/dL

## 2021-02-23 LAB — OSMOLALITY, URINE: Osmolality, Ur: 719 mOsm/kg (ref 300–900)

## 2021-02-23 LAB — URIC ACID: Uric Acid, Serum: 4.4 mg/dL (ref 3.7–8.6)

## 2021-02-23 LAB — BRAIN NATRIURETIC PEPTIDE: B Natriuretic Peptide: 208.9 pg/mL — ABNORMAL HIGH (ref 0.0–100.0)

## 2021-02-23 LAB — OSMOLALITY: Osmolality: 276 mOsm/kg (ref 275–295)

## 2021-02-23 LAB — COPPER, SERUM

## 2021-02-23 LAB — MAGNESIUM: Magnesium: 1.9 mg/dL (ref 1.7–2.4)

## 2021-02-23 MED ORDER — DICLOFENAC SODIUM 1 % EX GEL
2.0000 g | Freq: Three times a day (TID) | CUTANEOUS | Status: DC | PRN
  Filled 2021-02-23 (×2): qty 100

## 2021-02-23 MED ORDER — LORAZEPAM 2 MG/ML IJ SOLN
1.0000 mg | INTRAMUSCULAR | Status: AC | PRN
  Administered 2021-02-25: 2 mg via INTRAVENOUS
  Filled 2021-02-23: qty 1

## 2021-02-23 MED ORDER — LORAZEPAM 2 MG/ML IJ SOLN: 0.0000 mg | Freq: Two times a day (BID) | INTRAMUSCULAR | Status: DC

## 2021-02-23 MED ORDER — CHLORDIAZEPOXIDE HCL 5 MG PO CAPS
20.0000 mg | ORAL_CAPSULE | Freq: Three times a day (TID) | ORAL | Status: DC
Start: 2021-02-23 — End: 2021-02-25
  Administered 2021-02-23 – 2021-02-24 (×5): 20 mg via ORAL
  Filled 2021-02-23 (×6): qty 4

## 2021-02-23 MED ORDER — LACTATED RINGERS IV SOLN: INTRAVENOUS | Status: DC

## 2021-02-23 MED ORDER — LORAZEPAM 1 MG PO TABS: 1.0000 mg | ORAL_TABLET | ORAL | Status: AC | PRN

## 2021-02-23 NOTE — Progress Notes (Addendum)
Occupational Therapy Treatment Patient Details Name: Colin Stevens MRN: [id number] DOB: [birth year] Today's Date: 02/23/2021   History of present illness Pt adm 10/22 with acute hypoxic resp failure, etoh withdrawal and HTN urgency. PMH - HTN, etoh abuse   OT comments  Session focused on progression of standing to maximize OOB participation. Pt remains limited by anxiety, B LE pain and cognitive deficits. Pt able to demo improved bed mobility though continues to require assist of 2 to complete task. Pt able to briefly stand in [place] with Mod A x 2 but unable to sustain due to progressive anxiety. Standing attempts also hindered by continued bed malfunctions (will not stay elevated with pt sitting EOB. Nursing staff already aware of issue). Pt's wife present and hands on to assist pt. Based on continued extensive assist needed for ADLs/mobility, educated on recommendation of CIR with pt/wife in agreement. Encouraged OOB activity via maximove prn.   Recommendations for follow up therapy are one component of a multi-disciplinary discharge planning process, led by the attending physician.  Recommendations may be updated based on patient status, additional functional criteria and insurance authorization.    Follow Up Recommendations  Acute inpatient rehab (3hours/day)    Assistance Recommended at Discharge Frequent or constant Supervision/Assistance  Equipment Recommendations  Wheelchair (measurements OT);Wheelchair cushion (measurements OT)    Recommendations for Other Services      Precautions / Restrictions Precautions Precautions: Fall Precaution Comments: monitor O2, fearful of falling Restrictions Weight Bearing Restrictions: No       Mobility Bed Mobility Overal bed mobility: Needs Assistance Bed Mobility: Supine to Sit;Sit to Supine     Supine to sit: Mod assist;+2 for physical assistance;HOB elevated Sit to supine: Max assist;+2 for physical assistance;+2 for  safety/equipment   General bed mobility comments: Mod A x 2 progression to Mod A x 1 or Min A x 2 to sit EOB, able to assist moving B LE to EOB and use of bedrail to lift trunk. Max A x 2 to get back in bed safely due to pt attempting to lay horizontally in bed    Transfers Overall transfer level: Needs assistance Equipment used: Ambulation equipment used Transfers: Sit to/from Stand Sit to Stand: Mod assist;+2 physical assistance;+2 safety/equipment;From elevated surface           General transfer comment: Mod A x 2 to power up with pt able to lift bottom off of bed well. Pt unable to sustain standing for pad placement due to anxiety Transfer via Lift Equipment: Stedy   Balance Overall balance assessment: Needs assistance Sitting-balance support: Bilateral upper extremity supported;Feet supported Sitting balance-Leahy Scale: Poor Sitting balance - Comments: Min to mod assist to sit EOB with pt leaning posteriorly Postural control: Posterior lean Standing balance support: Bilateral upper extremity supported Standing balance-Leahy Scale: Poor                             ADL either performed or assessed with clinical judgement   ADL Overall ADL's : Needs assistance/impaired                                       General ADL Comments: Session focused on progression of standing for OOB activities. Pt had just finished full bath with NT and RN - worn out from this.     Vision   Vision Assessment?: No  apparent visual deficits   Perception     Praxis      Cognition Arousal/Alertness: Awake/alert Behavior During Therapy: Anxious;Flat affect Overall Cognitive Status: Impaired/Different from baseline Area of Impairment: Attention;Following commands;Problem solving;Awareness;Orientation;Memory;Safety/judgement                 Orientation Level: Disoriented to;Place;Time;Situation Current Attention Level: Sustained Memory: Decreased short-term  memory Following Commands: Follows one step commands with increased time Safety/Judgement: Decreased awareness of safety;Decreased awareness of deficits Awareness: Intellectual Problem Solving: Slow processing;Decreased initiation;Requires verbal cues;Requires tactile cues;Difficulty sequencing General Comments: Flat affect, reports it is January. When cued that Halloween is coming up, pt able to correct self to October. Pleasant and follows directions approx 75% of the time. Limited by anxiety and fear of falling          Exercises     Shoulder Instructions       General Comments Wife present and supportive, hands on to assist pt    Pertinent Vitals/ Pain       Pain Assessment: Faces Faces Pain Scale: Hurts little more Pain Location: B LE (primarily feet, worsens with WB) Pain Descriptors / Indicators: Grimacing;Guarding;Moaning Pain Intervention(s): Monitored during session;Limited activity within patient's tolerance  Home Living                                          Prior Functioning/Environment              Frequency  Min 2X/week        Progress Toward Goals  OT Goals(current goals can now be found in the care plan section)  Progress towards OT goals: Progressing toward goals  Acute Rehab OT Goals Patient Stated Goal: be able to walk, be able to go home OT Goal Formulation: With patient/family Time For Goal Achievement: 03/07/21 Potential to Achieve Goals: Good ADL Goals Pt Will Perform Grooming: with modified independence;sitting Pt Will Perform Lower Body Bathing: with min assist;sit to/from stand Pt Will Transfer to Toilet: with min assist;stand pivot transfer;bedside commode Additional ADL Goal #1: Pt to demo ability to sit EOB > 5 min during functional tasks with no more than Supervision to maintain balance  Plan Discharge plan needs to be updated    Co-evaluation                 AM-PAC OT "6 Clicks" Daily Activity      Outcome Measure   Help from another person eating meals?: A Little Help from another person taking care of personal grooming?: A Little Help from another person toileting, which includes using toliet, bedpan, or urinal?: Total Help from another person bathing (including washing, rinsing, drying)?: A Lot Help from another person to put on and taking off regular upper body clothing?: A Little Help from another person to put on and taking off regular lower body clothing?: Total 6 Click Score: 13    End of Session Equipment Utilized During Treatment: Gait belt  OT Visit Diagnosis: Unsteadiness on feet (R26.81);Other abnormalities of gait and mobility (R26.89);Muscle weakness (generalized) (M62.81);Other symptoms and signs involving cognitive function   Activity Tolerance Other (comment) (limited by anxiety)   Patient Left in bed;with call bell/phone within reach;with family/visitor present   Nurse Communication Mobility status;Other (comment) (L elbow and broken bed)        Time: 8938-1017 OT Time Calculation (min): 26 min  Charges: OT General Charges $  OT Visit: 1 Visit OT Treatments $Therapeutic Activity: 23-37 mins  Bradd Canary, OTR/L Acute Rehab Services Office: [phone]   Lorre Munroe 02/23/2021, 12:33 PM

## 2021-02-23 NOTE — Progress Notes (Signed)
PROGRESS NOTE                                                                                                                                                                                                             Patient Demographics:    Colin Stevens, is a 50 y.o. male, DOB - 1970/05/06, JQZ:[id number]  Outpatient Primary MD for the patient is Rankins, Fanny Dance, MD    LOS - 4  Admit date - 02/19/2021    Chief Complaint  Patient presents with   Shortness of Breath       Brief Narrative (HPI from H&P)  50 year old male with past medical history of hypertension, nicotine dependence, alcohol abuse who presents to [facility] emergency department via EMS with shortness of breath and DTs.   Subjective:   Patient in bed, appears comfortable, denies any headache, no fever, no chest pain or pressure, no shortness of breath , no abdominal pain. No new focal weakness.   Assessment  & Plan :     Acute Hypoxic Resp. Failure due to Acute on Chronic CHF -echocardiogram with EF 60% likely had mild acute on chronic diastolic CHF which has resolved post IV Lasix, continue beta-blocker.  Monitor closely.   2.  DTS - Librium dose adjusted, continue with CIWA protocol, added Catapres, also has borderline B12 deficiency and placed on replacement, also on thiamine and folic acid , seen by neuro. MRI brain unremarkable. Gradually improving.  3.  Hypertensive urgency.  Treating DTs, medications adjusted for blood pressure.  Monitor.  4.  Fall at home.  MRI brain along with spine unremarkable.  Mild L5 left-sided nerve compression which will be monitored, PT OT >> CIR.  5. Ongoing nicotine dependence and alcohol abuse.  Counseled to quit.  6.  Obesity.  BMI of 40.  Follow with PCP for weight loss.  7.  Hypomagnesemia.  Replaced.  8.  Ultrasound suggestive of cirrhosis with thrombocytopenia on ultrasound.  Likely alcoholic  cirrhosis, thrombocytopenia likely due to it.  Negative acute hepatitis panel.  9.  Hyponatremia.  Initially CHF now getting dehydrated, IVF on 02/23/21.       Condition -   Guarded  Family Communication  :  Wife bedside 02/20/21, called [phone] on 02/21/21, 02/23/21 - bedside  Code Status :  Full  Consults  :  Neuro  PUD Prophylaxis : PPI   Procedures  :     TTE - 1. Left ventricular ejection fraction, by estimation, is 60 to 65%. The left ventricle has normal function. The left ventricle has no regional wall motion abnormalities. Left ventricular diastolic function could not be evaluated.  2. Right ventricular systolic function is normal. The right ventricular size is normal. Tricuspid regurgitation signal is inadequate for assessing PA pressure.  3. The mitral valve is grossly normal. Trivial mitral valve regurgitation. No evidence of mitral stenosis.  4. The aortic valve was not well visualized. There is mild calcification of the aortic valve. Aortic valve regurgitation is not visualized. Mild aortic valve sclerosis is present, with no evidence of aortic valve stenosis.  RUQ [place] - Cirrhosis  MRI Brain and Spine - non acute MRI brain, MRI spine showing similar L-spine disease with possible mild L5 nerve compression on the left side.       Disposition Plan  :    Status is: Inpatient  Remains inpatient appropriate because: Severe DTs   DVT Prophylaxis  :  Lovenox   Lab Results  Component Value Date   PLT 151 02/23/2021    Diet :  Diet Order             Diet full liquid Room service appropriate? Yes; Fluid consistency: Thin  Diet effective now                    Inpatient Medications  Scheduled Meds:  chlordiazePOXIDE  20 mg Oral TID   cloNIDine  0.1 mg Oral BID   cyanocobalamin  1,000 mcg Subcutaneous Daily   enoxaparin (LOVENOX) injection  75 mg Subcutaneous Q24H   folic acid  1 mg Oral Daily   lisinopril  5 mg Oral Daily   metoprolol tartrate   100 mg Oral BID   multivitamin with minerals  1 tablet Oral Daily   nicotine  21 mg Transdermal Daily   nystatin  5 mL Mouth/Throat QID   pantoprazole  40 mg Oral Daily   thiamine  100 mg Oral Daily   [START ON 02/28/2021] vitamin B-12  1,000 mcg Oral Daily   Continuous Infusions:  lactated ringers     PRN Meds:.acetaminophen **OR** acetaminophen, albuterol, loperamide, LORazepam **OR** LORazepam, [DISCONTINUED] ondansetron **OR** ondansetron (ZOFRAN) IV, polyethylene glycol  Antibiotics  :    Anti-infectives (From admission, onward)    None        Time Spent in minutes  30   Susa Raring M.D on 02/23/2021 at 10:58 AM  To page go to [email or web address]   [facility] -  Office  [phone]  See all Orders from today for further details    Objective:   Vitals:   02/23/21 0300 02/23/21 0347 02/23/21 0746 02/23/21 0946  BP: 127/69  134/72 125/70  Pulse: 97  (!) 111 (!) 118  Resp: Temp: 98.7 F (37.1 C)  98.8 F (37.1 C) 98.3 F (36.8 C)  TempSrc: Oral  Oral Oral  SpO2: 92%  94% 92%  Weight:  (!) 475.2 kg    Height:        Wt Readings from Last 3 Encounters:  02/23/21 (!) 475.2 kg     Intake/Output Summary (Last 24 hours) at 02/23/2021 1058 Last data filed at 02/22/2021 1301 Gross per 24 hour  Intake 480 ml  Output 1250 ml  Net -770 ml     Physical Exam  Awake Alert x  2, improving DTs, No new F.N deficits,   [place].AT,PERRAL Supple Neck, No JVD,   Symmetrical Chest wall movement, Good air movement bilaterally, CTAB RRR,No Gallops, Rubs or new Murmurs,  +ve B.Sounds, Abd Soft, No tenderness,   No Cyanosis, Clubbing or edema,         Data Review:    CBC Recent Labs  Lab 02/19/21 1256 02/19/21 2201 02/20/21 0110 02/21/21 0320 02/22/21 0214 02/23/21 0158  WBC 8.9  --  6.3 12.1* 10.2 10.1  HGB 14.8 13.9 14.2 14.9 14.5 14.3  HCT 43.6 41.0 41.4 44.6 44.1 43.2  PLT 146*  --  143* 150 137* 151  MCV 103.8*  --  103.2* 106.7*  107.6* 103.6*  MCH 35.2*  --  35.4* 35.6* 35.4* 34.3*  MCHC 33.9  --  34.3 33.4 32.9 33.1  RDW 12.5  --  12.4 12.8 12.6 12.3  LYMPHSABS  --   --  0.2* 0.6* 0.7 0.6*  MONOABS  --   --  0.3 0.8 0.8 0.9  EOSABS  --   --  0.0 0.0 0.1 0.1  BASOSABS  --   --  0.0 0.0 0.1 0.0    Recent Labs  Lab 02/19/21 1256 02/19/21 1327 02/19/21 1444 02/19/21 1951 02/19/21 2100 02/19/21 2201 02/19/21 2259 02/20/21 0110 02/21/21 0320 02/22/21 0214 02/22/21 0529 02/23/21 0158  NA 130*  --   --   --   --  128*  --  133* 131* 132*  --  130*  K 4.5  --   --   --   --  5.0  --  4.4 4.5 4.2  --  4.1  CL 91*  --   --   --   --   --   --  93* 92* 92*  --  89*  CO2 28  --   --   --   --   --   --  31 32 30  --  33*  GLUCOSE 97  --   --   --   --   --   --  126* 99 91  --  97  BUN 6  --   --   --   --   --   --  9 13 14   --  14  CREATININE 0.68  --   --   --   --   --   --  0.68 0.68 0.67  --  0.65  CALCIUM 9.6  --   --   --   --   --   --  9.2 9.0 9.0  --  9.1  AST  --  27  --   --   --   --   --  20 24 21   --  21  ALT  --  29  --   --   --   --   --  28 26 22   --  24  ALKPHOS  --  52  --   --   --   --   --  47 46 43  --  45  BILITOT  --  0.9  --   --   --   --   --  0.9 1.0 1.2  --  1.7*  ALBUMIN  --  3.9  --   --   --   --   --  3.7 3.7 3.4*  --  3.4*  MG  --   --   --   --   --   --   --  1.4* 2.2 1.7  --  1.9  LATICACIDVEN  --  0.7  --   --   --   --   --   --   --   --   --   --   INR  --   --   --  1.0  --   --   --   --   --   --   --   --   TSH  --   --   --   --   --   --  3.067  --   --   --   --   --   AMMONIA  --   --   --   --  30  --   --   --   --   --   --   --   BNP  --   --  698.0*  --   --   --   --   --  291.1*  --  251.7* 208.9*    ------------------------------------------------------------------------------------------------------------------ No results for input(s): CHOL, HDL, LDLCALC, TRIG, CHOLHDL, LDLDIRECT in the last 72 hours.  No results found for:  HGBA1C ------------------------------------------------------------------------------------------------------------------ No results for input(s): TSH, T4TOTAL, T3FREE, THYROIDAB in the last 72 hours.  Invalid input(s): FREET3   Cardiac Enzymes No results for input(s): CKMB, TROPONINI, MYOGLOBIN in the last 168 hours.  Invalid input(s): CK ------------------------------------------------------------------------------------------------------------------    Component Value Date/Time   BNP 208.9 (H) 02/23/2021 0158     Radiology Reports DG Chest 2 View  Result Date: 02/19/2021 CLINICAL DATA:  50 year old male with shortness of breath. EXAM: CHEST - 2 VIEW COMPARISON:  03/04/2014 FINDINGS: The mediastinal contours are within normal limits. Mild cardiomegaly. Low lung volumes. My cephalization of pulmonary vasculature. Mild diffuse hazy pulmonary opacities with a bibasilar predominance. No pneumothorax or evidence of significant pleural effusion. No acute osseous abnormality. IMPRESSION: Mild pulmonary edema. Electronically Signed   By: Marliss Coots M.D.   On: 02/19/2021 13:59   CT Head Wo Contrast  Result Date: 02/19/2021 CLINICAL DATA:  Neuro deficit, acute, stroke suspected EXAM: CT HEAD WITHOUT CONTRAST TECHNIQUE: Contiguous axial images were obtained from the base of the skull through the vertex without intravenous contrast. COMPARISON:  None. FINDINGS: Brain: No evidence of acute infarction, hemorrhage, hydrocephalus, extra-axial collection or mass lesion/mass effect. Vascular: No hyperdense vessel or unexpected calcification. Skull: Normal. Negative for fracture or focal lesion. Sinuses/Orbits: Mild mucosal thickening of the LEFT greater than RIGHT maxillary sinuses. Other: None. IMPRESSION: No acute intracranial abnormality. Electronically Signed   By: Meda Klinefelter M.D.   On: 02/19/2021 14:57   MR BRAIN WO CONTRAST  Result Date: 02/19/2021 CLINICAL DATA:  Neuro deficit,  acute, stroke suspected. Generalized weakness and dizziness. EXAM: MRI HEAD WITHOUT CONTRAST TECHNIQUE: Multiplanar, multiecho pulse sequences of the brain and surrounding structures were obtained without intravenous contrast. COMPARISON:  Head CT same day FINDINGS: Brain: The brain has a normal appearance without evidence of malformation, atrophy, old or acute small or large vessel infarction, mass lesion, hemorrhage, hydrocephalus or extra-axial collection. Vascular: Major vessels at the base of the brain show flow. Venous sinuses appear patent. Skull and upper cervical spine: Normal. Sinuses/Orbits: Clear/normal. Other: None significant. IMPRESSION: Normal MRI of the brain. Electronically Signed   By: Paulina Fusi M.D.   On: 02/19/2021 18:23   MR THORACIC SPINE WO CONTRAST  Result Date: 02/19/2021 CLINICAL DATA:  Mid back pain.  Weakness and dizziness. EXAM:  MRI THORACIC SPINE WITHOUT CONTRAST TECHNIQUE: Multiplanar, multisequence MR imaging of the thoracic spine was performed. No intravenous contrast was administered. COMPARISON:  None. FINDINGS: Alignment:  Normal Vertebrae: Normal Cord: Thoracic cord is normal. No focal lesion. No cord compression. Paraspinal and other soft tissues: Normal Disc levels: No evidence of disc degeneration, bulge or herniation. No facet arthropathy. Insignificant superior endplate Schmorl's node at T9 without edema. IMPRESSION: Normal MRI of the thoracic spine. No cord abnormality. No abnormality seen to explain regional pain. Electronically Signed   By: Paulina Fusi M.D.   On: 02/19/2021 18:25   MR LUMBAR SPINE WO CONTRAST  Result Date: 02/19/2021 CLINICAL DATA:  Low back pain. Cauda equina syndrome. Generalized weakness. EXAM: MRI LUMBAR SPINE WITHOUT CONTRAST TECHNIQUE: Multiplanar, multisequence MR imaging of the lumbar spine was performed. No intravenous contrast was administered. COMPARISON:  None. FINDINGS: Segmentation:  5 lumbar type vertebral bodies assumed.  Alignment:  Mild curvature convex to the left with the apex at L3. Vertebrae:  No fracture or focal bone lesion. Conus medullaris and cauda equina: Conus extends to the L1 level. Conus and cauda equina appear normal. Paraspinal and other soft tissues: Negative Disc levels: L1-2: Mild noncompressive disc bulge. L2-3: Mild noncompressive disc bulge. L3-4: Mild noncompressive disc bulge. L4-5: Mild bulging of the disc. Mild facet and ligamentous hypertrophy. Mild narrowing of the lateral recesses left more than right. Some potential the left L5 nerve could be affected in this location. L5-S1: Normal interspace. IMPRESSION: Mild non-compressive disc bulges at L1-2, L2-3 and L3-4. L4-5 disc bulge more prominent towards the left. Mild facet and ligamentous hypertrophy. Mild stenosis of the left lateral recess at this level that could possibly cause compression of the left L5 nerve. Electronically Signed   By: Paulina Fusi M.D.   On: 02/19/2021 18:27   DG Chest Port 1 View  Result Date: 02/21/2021 CLINICAL DATA:  Shortness of breath. EXAM: PORTABLE CHEST 1 VIEW COMPARISON:  February 19, 2021. FINDINGS: The heart size and mediastinal contours are within normal limits. Minimal bibasilar subsegmental atelectasis or possibly edema is noted which is slightly improved compared to prior exam. The visualized skeletal structures are unremarkable. IMPRESSION: Slightly improved bibasilar opacities as described above. Electronically Signed   By: Lupita Raider M.D.   On: 02/21/2021 08:59   ECHOCARDIOGRAM COMPLETE  Result Date: 02/20/2021    ECHOCARDIOGRAM REPORT   Patient Name:   KAYL STOGDILL Date of Exam: 02/20/2021 Medical Rec #:  [id number]      Height:       76.0 in Accession #:    [id number]     Weight:       335.3 lb Date of Birth:  05-22-1970      BSA:          2.759 m Patient Age:    50 years       BP:           153/130 mmHg Patient Gender: M              HR:           111 bpm. Exam Location:  Inpatient Procedure:  2D Echo Indications:    acute systolic chf  History:        Patient has no prior history of Echocardiogram examinations.                 Risk Factors:Current Smoker, Alcohol use and Hypertension.  Sonographer:    Delcie Roch RDCS Referring  Phys: [id number] Deno Lunger [place] IMPRESSIONS  1. Left ventricular ejection fraction, by estimation, is 60 to 65%. The left ventricle has normal function. The left ventricle has no regional wall motion abnormalities. Left ventricular diastolic function could not be evaluated.  2. Right ventricular systolic function is normal. The right ventricular size is normal. Tricuspid regurgitation signal is inadequate for assessing PA pressure.  3. The mitral valve is grossly normal. Trivial mitral valve regurgitation. No evidence of mitral stenosis.  4. The aortic valve was not well visualized. There is mild calcification of the aortic valve. Aortic valve regurgitation is not visualized. Mild aortic valve sclerosis is present, with no evidence of aortic valve stenosis. Comparison(s): No prior Echocardiogram. Conclusion(s)/Recommendation(s): Normal biventricular function without evidence of hemodynamically significant valvular heart disease. FINDINGS  Left Ventricle: Left ventricular ejection fraction, by estimation, is 60 to 65%. The left ventricle has normal function. The left ventricle has no regional wall motion abnormalities. The left ventricular internal cavity size was normal in size. There is  no left ventricular hypertrophy. Left ventricular diastolic function could not be evaluated. Right Ventricle: The right ventricular size is normal. Right vetricular wall thickness was not well visualized. Right ventricular systolic function is normal. Tricuspid regurgitation signal is inadequate for assessing PA pressure. Left Atrium: Left atrial size was normal in size. Right Atrium: Right atrial size was normal in size. Pericardium: There is no evidence of pericardial effusion. Presence  of pericardial fat pad. Mitral Valve: The mitral valve is grossly normal. Trivial mitral valve regurgitation. No evidence of mitral valve stenosis. Tricuspid Valve: The tricuspid valve is grossly normal. Tricuspid valve regurgitation is trivial. No evidence of tricuspid stenosis. Aortic Valve: The aortic valve was not well visualized. There is mild calcification of the aortic valve. Aortic valve regurgitation is not visualized. Mild aortic valve sclerosis is present, with no evidence of aortic valve stenosis. Pulmonic Valve: The pulmonic valve was not well visualized. Pulmonic valve regurgitation is not visualized. Aorta: The aortic root and ascending aorta are structurally normal, with no evidence of dilitation. Venous: The inferior vena cava was not well visualized. IAS/Shunts: The interatrial septum was not well visualized.  LEFT VENTRICLE PLAX 2D LVIDd:         6.00 cm LVIDs:         3.80 cm LV PW:         1.10 cm LV IVS:        1.10 cm LVOT diam:     2.80 cm LV SV:         101 LV SV Index:   37 LVOT Area:     6.16 cm  RIGHT VENTRICLE RV S prime:     15.80 cm/s TAPSE (M-mode): 2.4 cm LEFT ATRIUM           Index        RIGHT ATRIUM           Index LA diam:      4.40 cm 1.59 cm/m   RA Area:     19.00 cm LA Vol (A4C): 71.1 ml 25.77 ml/m  RA Volume:   51.10 ml  18.52 ml/m  AORTIC VALVE LVOT Vmax:   103.00 cm/s LVOT Vmean:  67.700 cm/s LVOT VTI:    0.164 m  AORTA Ao Root diam: 3.60 cm  SHUNTS Systemic VTI:  0.16 m Systemic Diam: 2.80 cm Jodelle Red MD Electronically signed by Jodelle Red MD Signature Date/Time: 02/20/2021/1:32:52 PM    Final    US Abdomen  Limited RUQ (LIVER/GB)  Result Date: 02/19/2021 CLINICAL DATA:  Cirrhosis. EXAM: ULTRASOUND ABDOMEN LIMITED RIGHT UPPER QUADRANT COMPARISON:  None. FINDINGS: Gallbladder: Partially distended. No visualized gallstones. Diffuse gallbladder wall thickening at 6 mm. No sonographic Murphy sign noted by sonographer. Common bile duct:  Diameter: 5 mm, normal. Liver: Heterogeneous, coarsened, and diffusely increased in parenchymal echogenicity. The liver parenchyma is difficult to penetrate. Allowing for this, no focal lesion is seen. There may be a subtle capsular nodularity. Portal vein is patent on color Doppler imaging with normal direction of blood flow towards the liver. Other: No right upper quadrant ascites. IMPRESSION: 1. Heterogeneous, coarsened, and increased hepatic parenchymal echogenicity. There may be mild capsular nodularity. Findings consistent with steatosis and possible cirrhosis. 2. Liver parenchyma is difficult to penetrate, no focal lesion is seen. 3. Mild gallbladder wall thickening is nonspecific in the setting of chronic liver disease. No gallstones. No biliary dilatation. Electronically Signed   By: Narda Rutherford M.D.   On: 02/19/2021 21:05

## 2021-02-23 NOTE — Progress Notes (Addendum)
Heart Failure Nurse Navigator Progress Note  Possible DC plan to CIR. EF normal 60-65%. Pt spouse at bedside, states he is slowing improving--better today than yesterday. Spouse states Librium dosage increase has helped. Pt able to tell me name and DOB without hesitation, tremors improved but still visible.   No need for HV TOC clinic appt. HF Navigator available as needed.   Spouse would like resources for long term alcohol cessation.   Ozella Rocks, MSN, RN Heart Failure Nurse Navigator [phone]

## 2021-02-23 NOTE — Progress Notes (Signed)
Received verbal order for voltagel 2g TID PRN topical per MD . Lawson Radar, RN

## 2021-02-24 ENCOUNTER — Inpatient Hospital Stay (HOSPITAL_COMMUNITY): Payer: Self-pay

## 2021-02-24 LAB — COMPREHENSIVE METABOLIC PANEL
ALT: 20 U/L (ref 0–44)
AST: 17 U/L (ref 15–41)
Albumin: 3 g/dL — ABNORMAL LOW (ref 3.5–5.0)
Alkaline Phosphatase: 38 U/L (ref 38–126)
Anion gap: 6 (ref 5–15)
BUN: 10 mg/dL (ref 6–20)
CO2: 31 mmol/L (ref 22–32)
Calcium: 8.8 mg/dL — ABNORMAL LOW (ref 8.9–10.3)
Chloride: 91 mmol/L — ABNORMAL LOW (ref 98–111)
Creatinine, Ser: 0.66 mg/dL (ref 0.61–1.24)
GFR, Estimated: >60 mL/min (ref >60)
Glucose, Bld: 98 mg/dL (ref 70–99)
Potassium: 4.1 mmol/L (ref 3.5–5.1)
Sodium: 128 mmol/L — ABNORMAL LOW (ref 135–145)
Total Bilirubin: 1.2 mg/dL (ref 0.3–1.2)
Total Protein: 6 g/dL — ABNORMAL LOW (ref 6.5–8.1)

## 2021-02-24 LAB — CBC WITH DIFFERENTIAL/PLATELET
Abs Immature Granulocytes: 0.03 10*3/uL (ref 0.00–0.07)
Basophils Absolute: 0.1 10*3/uL (ref 0.0–0.1)
Basophils Relative: 1 %
Eosinophils Absolute: 0.2 10*3/uL (ref 0.0–0.5)
Eosinophils Relative: 2 %
HCT: 39 % (ref 39.0–52.0)
Hemoglobin: 13.4 g/dL (ref 13.0–17.0)
Immature Granulocytes: 0 %
Lymphocytes Relative: 6 %
Lymphs Abs: 0.6 10*3/uL — ABNORMAL LOW (ref 0.7–4.0)
MCH: 35.3 pg — ABNORMAL HIGH (ref 26.0–34.0)
MCHC: 34.4 g/dL (ref 30.0–36.0)
MCV: 102.6 fL — ABNORMAL HIGH (ref 80.0–100.0)
Monocytes Absolute: 1 10*3/uL (ref 0.1–1.0)
Monocytes Relative: 10 %
Neutro Abs: 8.3 10*3/uL — ABNORMAL HIGH (ref 1.7–7.7)
Neutrophils Relative %: 81 %
Platelets: 162 10*3/uL (ref 150–400)
RBC: 3.8 MIL/uL — ABNORMAL LOW (ref 4.22–5.81)
RDW: 12.1 % (ref 11.5–15.5)
WBC: 10.2 10*3/uL (ref 4.0–10.5)
nRBC: 0 % (ref 0.0–0.2)

## 2021-02-24 LAB — MAGNESIUM: Magnesium: 1.8 mg/dL (ref 1.7–2.4)

## 2021-02-24 LAB — PROCALCITONIN: Procalcitonin: <0.1 ng/mL

## 2021-02-24 LAB — UREA NITROGEN, URINE: Urea Nitrogen, Ur: 1073 mg/dL

## 2021-02-24 LAB — MRSA NEXT GEN BY PCR, NASAL: MRSA by PCR Next Gen: NOT DETECTED

## 2021-02-24 LAB — BRAIN NATRIURETIC PEPTIDE: B Natriuretic Peptide: 162.3 pg/mL — ABNORMAL HIGH (ref 0.0–100.0)

## 2021-02-24 LAB — VITAMIN B1: Vitamin B1 (Thiamine): 132.9 nmol/L (ref 66.5–200.0)

## 2021-02-24 MED ORDER — COLCHICINE 0.6 MG PO TABS
0.6000 mg | ORAL_TABLET | Freq: Once | ORAL | Status: AC
  Administered 2021-02-24: 0.6 mg via ORAL
  Filled 2021-02-24: qty 1

## 2021-02-24 MED ORDER — CEFAZOLIN SODIUM-DEXTROSE 1-4 GM/50ML-% IV SOLN
1.0000 g | Freq: Three times a day (TID) | INTRAVENOUS | Status: DC
  Administered 2021-02-24 – 2021-02-25 (×4): 1 g via INTRAVENOUS
  Filled 2021-02-24 (×6): qty 50

## 2021-02-24 MED ORDER — COLCHICINE 0.6 MG PO TABS
1.2000 mg | ORAL_TABLET | Freq: Once | ORAL | Status: AC
  Administered 2021-02-24: 1.2 mg via ORAL
  Filled 2021-02-24: qty 2

## 2021-02-24 MED ORDER — METHYLPREDNISOLONE SODIUM SUCC 125 MG IJ SOLR
60.0000 mg | Freq: Once | INTRAMUSCULAR | Status: AC
  Administered 2021-02-24: 60 mg via INTRAVENOUS
  Filled 2021-02-24: qty 2

## 2021-02-24 MED ORDER — SODIUM CHLORIDE 1 G PO TABS
2.0000 g | ORAL_TABLET | Freq: Three times a day (TID) | ORAL | Status: AC
  Administered 2021-02-24 (×2): 2 g via ORAL
  Filled 2021-02-24 (×3): qty 2

## 2021-02-24 NOTE — Progress Notes (Addendum)
Physical Therapy Treatment Patient Details Name: Colin Stevens MRN: [id number] DOB: Jul 30, 1970 Today's Date: 02/24/2021   History of Present Illness Pt is a 50 y.o. admitted 02/19/21 with SOB, falls at home. Workup for acute hypoxic respiratory failure secondary to acute on chronic CHF, HTN urgency, DTs. Brain MRI negative for acute abnormality; mild L5 L-side nerve compression. Pt with L elbow pain and redness; xray negative for fx; plan to treat empirically for gout and cellulitis. PMH includes HTN, substance abuse.   PT Comments    Pt progressing with mobility. Today's session focused on transfer training and standing tolerance in stedy frame, pt requiring modA+2 to stand. Pt motivated to participate, but limited by significant fear of falling resulting in full body tremors/shakiness with standing; recommend use of maximove for OOB transfers with nursing staff. Pt limited by pain, generalized weakness, decreased activity tolerance, poor balance strategies/postural reactions and impaired cognition. Continue to recommend intensive CIR-level therapies to maximize functional mobility and independence prior to return home.    Recommendations for follow up therapy are one component of a multi-disciplinary discharge planning process, led by the attending physician.  Recommendations may be updated based on patient status, additional functional criteria and insurance authorization.  Follow Up Recommendations  Acute inpatient rehab (3hours/day)     Assistance Recommended at Discharge Frequent or constant Supervision/Assistance  Equipment Recommendations  TBD - potential Wheelchair cushion (measurements PT);Wheelchair (measurements PT);3in1 (PT);Rolling walker (2 wheels)    Recommendations for Other Services       Precautions / Restrictions Precautions Precautions: Fall;Other (comment) Precaution Comments: monitor O2, fearful of falling, L elbow gout Restrictions Weight Bearing Restrictions:  No     Mobility  Bed Mobility Overal bed mobility: Needs Assistance Bed Mobility: Supine to Sit     Supine to sit: Mod assist;HOB elevated     General bed mobility comments: Pt with difficulty sequencing movement to EOB, ultimately requiring consistent cues to advance B LE to EOB, modA for BLE management, scooting hips to EOB and elevating trunk, requiring guidance to use handrails to assist    Transfers Overall transfer level: Needs assistance Equipment used: Ambulation equipment used Transfers: Sit to/from Stand Sit to Stand: Mod assist;+2 physical assistance;+2 safety/equipment;From elevated surface           General transfer comment: ModA+2 for trunk elevation standing from elevated bed into stedy frame with BUE support pulling on stedy; additional 3x stands from [place] seat with min guard+2; poor eccentric control requiring modA+2 to lower to sit in low recliner height Transfer via Lift Equipment: Stedy  Ambulation/Gait                 Stairs             Wheelchair Mobility    Modified Rankin (Stroke Patients Only)       Balance Overall balance assessment: Needs assistance Sitting-balance support: Bilateral upper extremity supported;Feet supported Sitting balance-Leahy Scale: Poor Sitting balance - Comments: reliant on UE support EOB to maintain balance Postural control: Posterior lean Standing balance support: Bilateral upper extremity supported Standing balance-Leahy Scale: Poor Standing balance comment: reliant on UE support and external assist                            Cognition Arousal/Alertness: Awake/alert Behavior During Therapy: Anxious;Flat affect Overall Cognitive Status: Impaired/Different from baseline Area of Impairment: Attention;Following commands;Problem solving;Awareness;Orientation;Memory;Safety/judgement  Orientation Level: Disoriented to;Place;Time Current Attention Level:  Sustained Memory: Decreased short-term memory Following Commands: Follows one step commands with increased time Safety/Judgement: Decreased awareness of safety;Decreased awareness of deficits Awareness: Intellectual Problem Solving: Slow processing;Decreased initiation;Requires verbal cues;Requires tactile cues;Difficulty sequencing General Comments: Flat affect, Pleasant and follows directions with increased time. Limited by anxiety and fear of falling. able to demo improving ability to converse about topics he likes (music) though still some difficulty with word finding        Exercises General Exercises - Lower Extremity Ankle Circles/Pumps: AROM;Both;Seated Long Arc Quad: AROM;Both;Seated (full ROM limited by "tightness")    General Comments General comments (skin integrity, edema, etc.): SpO2 91-93% on RA; frequent cues for pursed lip breathing and calming strategies (including playing music) as pt very anxious/shaky/fearful of falling      Pertinent Vitals/Pain Pain Assessment: Faces Faces Pain Scale: Hurts little more Pain Location: L elbow, bilateral elbow, RLE Pain Descriptors / Indicators: Grimacing;Guarding;Tightness Pain Intervention(s): Monitored during session;Limited activity within patient's tolerance;Repositioned    Home Living                          Prior Function            PT Goals (current goals can now be found in the care plan section) Acute Rehab PT Goals Patient Stated Goal: get back to walking PT Goal Formulation: With patient/family Time For Goal Achievement: 03/06/21 Potential to Achieve Goals: Fair Progress towards PT goals: Progressing toward goals    Frequency    Min 3X/week      PT Plan Current plan remains appropriate    Co-evaluation PT/OT/SLP Co-Evaluation/Treatment: Yes Reason for Co-Treatment: Complexity of the patient's impairments (multi-system involvement);For patient/therapist safety;To address functional/ADL  transfers   OT goals addressed during session: ADL's and self-care;Other (comment) (functional transfers)      AM-PAC PT "6 Clicks" Mobility   Outcome Measure  Help needed turning from your back to your side while in a flat bed without using bedrails?: A Lot Help needed moving from lying on your back to sitting on the side of a flat bed without using bedrails?: A Lot Help needed moving to and from a bed to a chair (including a wheelchair)?: A Lot Help needed standing up from a chair using your arms (e.g., wheelchair or bedside chair)?: Total Help needed to walk in hospital room?: Total Help needed climbing 3-5 steps with a railing? : Total 6 Click Score: 9    End of Session Equipment Utilized During Treatment: Gait belt Activity Tolerance: Patient tolerated treatment well Patient left: in chair;with call bell/phone within reach;with chair alarm set Nurse Communication: Mobility status;Need for lift equipment PT Visit Diagnosis: Other abnormalities of gait and mobility (R26.89);Muscle weakness (generalized) (M62.81);Other symptoms and signs involving the nervous system (R29.898)     Time: 8295-6213 PT Time Calculation (min) (ACUTE ONLY): 36 min  Charges:  $Therapeutic Activity: 8-22 mins                     Ina Homes, PT, DPT Acute Rehabilitation Services  Pager [phone] Office [phone]  Malachy Chamber 02/24/2021, 3:59 PM

## 2021-02-24 NOTE — Progress Notes (Signed)
Inpatient Rehabilitation Admissions Coordinator   I met at bedside with patient and wife. We discussed goals and expectations of possible CIR admit. I also discussed estimated cost of care without insurance. Wife has family at [facility] long term .I encouraged her to also discuss with [facility] cost of care for SNF and if they would consider him for short term rehab. I will follow his progress, but [facility] acute inpt rehab beds limited.   Danne Baxter, RN, MSN Rehab Admissions Coordinator [phone] 02/24/2021 3:31 PM

## 2021-02-24 NOTE — Progress Notes (Signed)
Mobility Specialist: Progress Note   02/24/21 1611  Mobility  Activity Transferred:  Chair to bed  Level of Assistance +2 (takes two people)  Assistive Device MaxiMove  Mobility Out of bed to chair with meals  Mobility Response Tolerated well  Mobility performed by Mobility specialist;Nurse  $Mobility charge 1 Mobility   Pt assisted back to bed per RN request with assistance from RN. Pt has no c/o throughout. Pt is in the bed with family present in the room.   [facility] Ruth Tully Mobility Specialist Mobility Specialist Phone: [phone]

## 2021-02-24 NOTE — Progress Notes (Signed)
PROGRESS NOTE                                                                                                                                                                                                             Patient Demographics:    Colin Stevens, is a 50 y.o. male, DOB - 1970/12/22, QAS:[id number]  Outpatient Primary MD for the patient is Rankins, Fanny Dance, MD    LOS - 5  Admit date - 02/19/2021    Chief Complaint  Patient presents with   Shortness of Breath       Brief Narrative (HPI from H&P)  50 year old male with past medical history of hypertension, nicotine dependence, alcohol abuse who presents to [facility] emergency department via EMS with shortness of breath and DTs.   Subjective:   Patient in bed, appears comfortable, denies any headache, no fever, no chest pain or pressure, no shortness of breath , no abdominal pain. No new focal weakness.   Assessment  & Plan :     Acute Hypoxic Resp. Failure due to Acute on Chronic CHF -echocardiogram with EF 60% likely had mild acute on chronic diastolic CHF which has resolved post IV Lasix, continue beta-blocker.  Monitor closely.   2.  DTS - Librium dose adjusted, continue with CIWA protocol, added Catapres, also has borderline B12 deficiency and placed on replacement, also on thiamine and folic acid , seen by neuro. MRI brain unremarkable. Gradually improving.  3.  Hypertensive urgency.  Treating DTs, medications adjusted for blood pressure.  Monitor.  4.  Fall at home.  MRI brain along with spine unremarkable.  Mild L5 left-sided nerve compression which will be monitored, PT OT >> CIR.  5. Ongoing nicotine dependence and alcohol abuse.  Counseled to quit.  6.  Obesity.  BMI of 40.  Follow with PCP for weight loss.  7.  Hypomagnesemia.  Replaced.  8.  Ultrasound suggestive of cirrhosis with thrombocytopenia on ultrasound.  Likely alcoholic  cirrhosis, thrombocytopenia likely due to it.  Negative acute hepatitis panel.  9.  Hyponatremia.  Initially CHF and has been diuresed, diuretics were stopped on 02/22/2021, subsequently no edema and somewhat dehydrated received a liter and a half of IV fluids on 02/23/2021, sodium has dropped further on 02/25/2019 was questioning if he is developing SIADH, urine osmolality remains much higher  than serum, urine sodium is not reliable due to recent diuretic dose have ordered urine urea which is pending.  Serum uric acid is low normal, will give him a challenge of low-dose Lasix, salt tablets and free water restriction and monitor.  May require Samsca if not better by tomorrow.  10.  Left elbow redness and pain.  Most likely gout, according to the wife.  Started on 02/23/2021, Ortho on board, x-ray stable, patient has no recollection of any injury but previous history of gout, uric acid is low normal but could be due to consumption.  Empiric IV antibiotic placed, 1 dose of systemic steroid, do not think this is infection but will wait for final orthopedic input.  Not enough fluid for an aspiration at this time.      Condition -   Guarded  Family Communication  :  Wife bedside 02/20/21, called [phone] on 02/21/21, 02/23/21, 02/24/21 - bedside  Code Status :  Full  Consults  :  Neuro  PUD Prophylaxis : PPI   Procedures  :     TTE - 1. Left ventricular ejection fraction, by estimation, is 60 to 65%. The left ventricle has normal function. The left ventricle has no regional wall motion abnormalities. Left ventricular diastolic function could not be evaluated.  2. Right ventricular systolic function is normal. The right ventricular size is normal. Tricuspid regurgitation signal is inadequate for assessing PA pressure.  3. The mitral valve is grossly normal. Trivial mitral valve regurgitation. No evidence of mitral stenosis.  4. The aortic valve was not well visualized. There is mild calcification  of the aortic valve. Aortic valve regurgitation is not visualized. Mild aortic valve sclerosis is present, with no evidence of aortic valve stenosis.  RUQ [place] - Cirrhosis  MRI Brain and Spine - non acute MRI brain, MRI spine showing similar L-spine disease with possible mild L5 nerve compression on the left side.       Disposition Plan  :    Status is: Inpatient  Remains inpatient appropriate because: Severe DTs   DVT Prophylaxis  :  Lovenox   Lab Results  Component Value Date   PLT 162 02/24/2021    Diet :  Diet Order             Diet full liquid Room service appropriate? Yes; Fluid consistency: Thin  Diet effective now                    Inpatient Medications  Scheduled Meds:  chlordiazePOXIDE  20 mg Oral TID   cloNIDine  0.1 mg Oral BID   colchicine  0.6 mg Oral Once   colchicine  1.2 mg Oral Once   cyanocobalamin  1,000 mcg Subcutaneous Daily   enoxaparin (LOVENOX) injection  75 mg Subcutaneous Q24H   folic acid  1 mg Oral Daily   lisinopril  5 mg Oral Daily   methylPREDNISolone (SOLU-MEDROL) injection  60 mg Intravenous Once   metoprolol tartrate  100 mg Oral BID   multivitamin with minerals  1 tablet Oral Daily   nicotine  21 mg Transdermal Daily   nystatin  5 mL Mouth/Throat QID   pantoprazole  40 mg Oral Daily   sodium chloride  2 g Oral TID WC   thiamine  100 mg Oral Daily   [START ON 02/28/2021] vitamin B-12  1,000 mcg Oral Daily   Continuous Infusions:   ceFAZolin (ANCEF) IV     PRN Meds:.acetaminophen **OR** acetaminophen, albuterol, diclofenac Sodium,  loperamide, LORazepam **OR** LORazepam, [DISCONTINUED] ondansetron **OR** ondansetron (ZOFRAN) IV, polyethylene glycol  Antibiotics  :    Anti-infectives (From admission, onward)    Start     Dose/Rate Route Frequency Ordered Stop   02/24/21 1100  ceFAZolin (ANCEF) IVPB 1 g/50 mL premix        1 g 100 mL/hr over 30 Minutes Intravenous Every 8 hours 02/24/21 0932          Time Spent  in minutes  30   Susa Raring M.D on 02/24/2021 at 10:12 AM  To page go to [email or web address]   [facility] -  Office  [phone]  See all Orders from today for further details    Objective:   Vitals:   02/23/21 2026 02/23/21 2344 02/24/21 0327 02/24/21 0742  BP: 136/68 115/71 124/78 (!) 145/82  Pulse: 100 87 96 (!) 106  Resp: 20 20 20 18   Temp: 98.6 F (37 C) 98.2 F (36.8 C) 99.5 F (37.5 C) 98.1 F (36.7 C)  TempSrc: Oral Oral Oral Oral  SpO2: 92% 94% 95% 95%  Weight:   (!) 149.7 kg   Height:        Wt Readings from Last 3 Encounters:  02/24/21 (!) 149.7 kg     Intake/Output Summary (Last 24 hours) at 02/24/2021 1012 Last data filed at 02/24/2021 0723 Gross per 24 hour  Intake 946.51 ml  Output 2550 ml  Net -1603.49 ml     Physical Exam  Awake Alert x2, No new F.N deficits, Mild DTs, L.Elbow is red and tender, mild effusion if any [place].AT,PERRAL Supple Neck, No JVD,   Symmetrical Chest wall movement, Good air movement bilaterally, CTAB RRR,No Gallops, Rubs or new Murmurs,  +ve B.Sounds, Abd Soft, No tenderness,   No Cyanosis, Clubbing or edema,          Data Review:    CBC Recent Labs  Lab 02/20/21 0110 02/21/21 0320 02/22/21 0214 02/23/21 0158 02/24/21 0153  WBC 6.3 12.1* 10.2 10.1 10.2  HGB 14.2 14.9 14.5 14.3 13.4  HCT 41.4 44.6 44.1 43.2 39.0  PLT 143* 150 137* 151 162  MCV 103.2* 106.7* 107.6* 103.6* 102.6*  MCH 35.4* 35.6* 35.4* 34.3* 35.3*  MCHC 34.3 33.4 32.9 33.1 34.4  RDW 12.4 12.8 12.6 12.3 12.1  LYMPHSABS 0.2* 0.6* 0.7 0.6* 0.6*  MONOABS 0.3 0.8 0.8 0.9 1.0  EOSABS 0.0 0.0 0.1 0.1 0.2  BASOSABS 0.0 0.0 0.1 0.0 0.1    Recent Labs  Lab 02/19/21 1327 02/19/21 1444 02/19/21 1951 02/19/21 2100 02/19/21 2201 02/19/21 2259 02/20/21 0110 02/21/21 0320 02/22/21 0214 02/22/21 0529 02/23/21 0158 02/24/21 0153  NA  --   --   --   --    < >  --  133* 131* 132*  --  130* 128*  K  --   --   --   --    < >  --  4.4 4.5  4.2  --  4.1 4.1  CL  --   --   --   --   --   --  93* 92* 92*  --  89* 91*  CO2  --   --   --   --   --   --  31 32 30  --  33* 31  GLUCOSE  --   --   --   --   --   --  126* 99 91  --  97 98  BUN  --   --   --   --   --   --  --  14 10  CREATININE  --   --   --   --   --   --  0.68 0.68 0.67  --  0.65 0.66  CALCIUM  --   --   --   --   --   --  9.2 9.0 9.0  --  9.1 8.8*  AST 27  --   --   --   --   --  --  21 17  ALT 29  --   --   --   --   --  --  24 20  ALKPHOS 52  --   --   --   --   --  47 46 43  --  45 38  BILITOT 0.9  --   --   --   --   --  0.9 1.0 1.2  --  1.7* 1.2  ALBUMIN 3.9  --   --   --   --   --  3.7 3.7 3.4*  --  3.4* 3.0*  MG  --   --   --   --   --   --  1.4* 2.2 1.7  --  1.9 1.8  PROCALCITON  --   --   --   --   --   --   --   --   --   --   --  <0.10  LATICACIDVEN 0.7  --   --   --   --   --   --   --   --   --   --   --   INR  --   --  1.0  --   --   --   --   --   --   --   --   --   TSH  --   --   --   --   --  3.067  --   --   --   --   --   --   AMMONIA  --   --   --  30  --   --   --   --   --   --   --   --   BNP  --  698.0*  --   --   --   --   --  291.1*  --  251.7* 208.9* 162.3*   < > = values in this interval not displayed.    ------------------------------------------------------------------------------------------------------------------ No results for input(s): CHOL, HDL, LDLCALC, TRIG, CHOLHDL, LDLDIRECT in the last 72 hours.  No results found for: HGBA1C ------------------------------------------------------------------------------------------------------------------ No results for input(s): TSH, T4TOTAL, T3FREE, THYROIDAB in the last 72 hours.  Invalid input(s): FREET3   Cardiac Enzymes No results for input(s): CKMB, TROPONINI, MYOGLOBIN in the last 168 hours.  Invalid input(s): CK ------------------------------------------------------------------------------------------------------------------    Component Value  Date/Time   BNP 162.3 (H) 02/24/2021 0153     Radiology Reports DG Chest 2 View  Result Date: 02/19/2021 CLINICAL DATA:  50 year old male with shortness of breath. EXAM: CHEST - 2 VIEW COMPARISON:  03/04/2014 FINDINGS: The mediastinal contours are within normal limits. Mild cardiomegaly. Low lung volumes. My cephalization of pulmonary vasculature. Mild diffuse hazy pulmonary opacities with a bibasilar predominance. No pneumothorax or evidence of significant pleural effusion. No acute osseous abnormality. IMPRESSION: Mild pulmonary edema. Electronically Signed   By: Domingo Dimes  Suttle M.D.   On: 02/19/2021 13:59   DG ELBOW COMPLETE LEFT (3+VIEW)  Result Date: 02/24/2021 CLINICAL DATA:  LEFT posterior elbow pain EXAM: LEFT ELBOW - COMPLETE 3+ VIEW COMPARISON:  None. FINDINGS: No evidence of fracture of the ulna or humerus. The radial head is normal. No joint effusion. IMPRESSION: No fracture or dislocation. Electronically Signed   By: Genevive Bi M.D.   On: 02/24/2021 09:06   CT Head Wo Contrast  Result Date: 02/19/2021 CLINICAL DATA:  Neuro deficit, acute, stroke suspected EXAM: CT HEAD WITHOUT CONTRAST TECHNIQUE: Contiguous axial images were obtained from the base of the skull through the vertex without intravenous contrast. COMPARISON:  None. FINDINGS: Brain: No evidence of acute infarction, hemorrhage, hydrocephalus, extra-axial collection or mass lesion/mass effect. Vascular: No hyperdense vessel or unexpected calcification. Skull: Normal. Negative for fracture or focal lesion. Sinuses/Orbits: Mild mucosal thickening of the LEFT greater than RIGHT maxillary sinuses. Other: None. IMPRESSION: No acute intracranial abnormality. Electronically Signed   By: Meda Klinefelter M.D.   On: 02/19/2021 14:57   MR BRAIN WO CONTRAST  Result Date: 02/19/2021 CLINICAL DATA:  Neuro deficit, acute, stroke suspected. Generalized weakness and dizziness. EXAM: MRI HEAD WITHOUT CONTRAST TECHNIQUE:  Multiplanar, multiecho pulse sequences of the brain and surrounding structures were obtained without intravenous contrast. COMPARISON:  Head CT same day FINDINGS: Brain: The brain has a normal appearance without evidence of malformation, atrophy, old or acute small or large vessel infarction, mass lesion, hemorrhage, hydrocephalus or extra-axial collection. Vascular: Major vessels at the base of the brain show flow. Venous sinuses appear patent. Skull and upper cervical spine: Normal. Sinuses/Orbits: Clear/normal. Other: None significant. IMPRESSION: Normal MRI of the brain. Electronically Signed   By: Paulina Fusi M.D.   On: 02/19/2021 18:23   MR THORACIC SPINE WO CONTRAST  Result Date: 02/19/2021 CLINICAL DATA:  Mid back pain.  Weakness and dizziness. EXAM: MRI THORACIC SPINE WITHOUT CONTRAST TECHNIQUE: Multiplanar, multisequence MR imaging of the thoracic spine was performed. No intravenous contrast was administered. COMPARISON:  None. FINDINGS: Alignment:  Normal Vertebrae: Normal Cord: Thoracic cord is normal. No focal lesion. No cord compression. Paraspinal and other soft tissues: Normal Disc levels: No evidence of disc degeneration, bulge or herniation. No facet arthropathy. Insignificant superior endplate Schmorl's node at T9 without edema. IMPRESSION: Normal MRI of the thoracic spine. No cord abnormality. No abnormality seen to explain regional pain. Electronically Signed   By: Paulina Fusi M.D.   On: 02/19/2021 18:25   MR LUMBAR SPINE WO CONTRAST  Result Date: 02/19/2021 CLINICAL DATA:  Low back pain. Cauda equina syndrome. Generalized weakness. EXAM: MRI LUMBAR SPINE WITHOUT CONTRAST TECHNIQUE: Multiplanar, multisequence MR imaging of the lumbar spine was performed. No intravenous contrast was administered. COMPARISON:  None. FINDINGS: Segmentation:  5 lumbar type vertebral bodies assumed. Alignment:  Mild curvature convex to the left with the apex at L3. Vertebrae:  No fracture or focal bone  lesion. Conus medullaris and cauda equina: Conus extends to the L1 level. Conus and cauda equina appear normal. Paraspinal and other soft tissues: Negative Disc levels: L1-2: Mild noncompressive disc bulge. L2-3: Mild noncompressive disc bulge. L3-4: Mild noncompressive disc bulge. L4-5: Mild bulging of the disc. Mild facet and ligamentous hypertrophy. Mild narrowing of the lateral recesses left more than right. Some potential the left L5 nerve could be affected in this location. L5-S1: Normal interspace. IMPRESSION: Mild non-compressive disc bulges at L1-2, L2-3 and L3-4. L4-5 disc bulge more prominent towards the left. Mild facet and  ligamentous hypertrophy. Mild stenosis of the left lateral recess at this level that could possibly cause compression of the left L5 nerve. Electronically Signed   By: Paulina Fusi M.D.   On: 02/19/2021 18:27   DG Chest Port 1 View  Result Date: 02/24/2021 CLINICAL DATA:  Shortness of breath. EXAM: PORTABLE CHEST 1 VIEW COMPARISON:  02/21/2021 FINDINGS: 0732 hours. Low volume film. The cardio pericardial silhouette is enlarged. Interstitial markings are diffusely coarsened with chronic features. The lungs are clear without focal pneumonia, edema, pneumothorax or pleural effusion. The visualized bony structures of the thorax show no acute abnormality. Telemetry leads overlie the chest. IMPRESSION: Low volume film with chronic interstitial coarsening. Electronically Signed   By: Kennith Center M.D.   On: 02/24/2021 07:50   DG Chest Port 1 View  Result Date: 02/21/2021 CLINICAL DATA:  Shortness of breath. EXAM: PORTABLE CHEST 1 VIEW COMPARISON:  February 19, 2021. FINDINGS: The heart size and mediastinal contours are within normal limits. Minimal bibasilar subsegmental atelectasis or possibly edema is noted which is slightly improved compared to prior exam. The visualized skeletal structures are unremarkable. IMPRESSION: Slightly improved bibasilar opacities as described above.  Electronically Signed   By: Lupita Raider M.D.   On: 02/21/2021 08:59   ECHOCARDIOGRAM COMPLETE  Result Date: 02/20/2021    ECHOCARDIOGRAM REPORT   Patient Name:   DAIEL TESSMER Date of Exam: 02/20/2021 Medical Rec #:  [id number]      Height:       76.0 in Accession #:    [id number]     Weight:       335.3 lb Date of Birth:  01/12/1971      BSA:          2.759 m Patient Age:    50 years       BP:           153/130 mmHg Patient Gender: M              HR:           111 bpm. Exam Location:  Inpatient Procedure: 2D Echo Indications:    acute systolic chf  History:        Patient has no prior history of Echocardiogram examinations.                 Risk Factors:Current Smoker, Alcohol use and Hypertension.  Sonographer:    Delcie Roch RDCS Referring Phys: [id number] Deno Lunger [place] IMPRESSIONS  1. Left ventricular ejection fraction, by estimation, is 60 to 65%. The left ventricle has normal function. The left ventricle has no regional wall motion abnormalities. Left ventricular diastolic function could not be evaluated.  2. Right ventricular systolic function is normal. The right ventricular size is normal. Tricuspid regurgitation signal is inadequate for assessing PA pressure.  3. The mitral valve is grossly normal. Trivial mitral valve regurgitation. No evidence of mitral stenosis.  4. The aortic valve was not well visualized. There is mild calcification of the aortic valve. Aortic valve regurgitation is not visualized. Mild aortic valve sclerosis is present, with no evidence of aortic valve stenosis. Comparison(s): No prior Echocardiogram. Conclusion(s)/Recommendation(s): Normal biventricular function without evidence of hemodynamically significant valvular heart disease. FINDINGS  Left Ventricle: Left ventricular ejection fraction, by estimation, is 60 to 65%. The left ventricle has normal function. The left ventricle has no regional wall motion abnormalities. The left ventricular internal cavity size was  normal in size. There is  no left ventricular hypertrophy.  Left ventricular diastolic function could not be evaluated. Right Ventricle: The right ventricular size is normal. Right vetricular wall thickness was not well visualized. Right ventricular systolic function is normal. Tricuspid regurgitation signal is inadequate for assessing PA pressure. Left Atrium: Left atrial size was normal in size. Right Atrium: Right atrial size was normal in size. Pericardium: There is no evidence of pericardial effusion. Presence of pericardial fat pad. Mitral Valve: The mitral valve is grossly normal. Trivial mitral valve regurgitation. No evidence of mitral valve stenosis. Tricuspid Valve: The tricuspid valve is grossly normal. Tricuspid valve regurgitation is trivial. No evidence of tricuspid stenosis. Aortic Valve: The aortic valve was not well visualized. There is mild calcification of the aortic valve. Aortic valve regurgitation is not visualized. Mild aortic valve sclerosis is present, with no evidence of aortic valve stenosis. Pulmonic Valve: The pulmonic valve was not well visualized. Pulmonic valve regurgitation is not visualized. Aorta: The aortic root and ascending aorta are structurally normal, with no evidence of dilitation. Venous: The inferior vena cava was not well visualized. IAS/Shunts: The interatrial septum was not well visualized.  LEFT VENTRICLE PLAX 2D LVIDd:         6.00 cm LVIDs:         3.80 cm LV PW:         1.10 cm LV IVS:        1.10 cm LVOT diam:     2.80 cm LV SV:         101 LV SV Index:   37 LVOT Area:     6.16 cm  RIGHT VENTRICLE RV S prime:     15.80 cm/s TAPSE (M-mode): 2.4 cm LEFT ATRIUM           Index        RIGHT ATRIUM           Index LA diam:      4.40 cm 1.59 cm/m   RA Area:     19.00 cm LA Vol (A4C): 71.1 ml 25.77 ml/m  RA Volume:   51.10 ml  18.52 ml/m  AORTIC VALVE LVOT Vmax:   103.00 cm/s LVOT Vmean:  67.700 cm/s LVOT VTI:    0.164 m  AORTA Ao Root diam: 3.60 cm  SHUNTS Systemic  VTI:  0.16 m Systemic Diam: 2.80 cm Jodelle Red MD Electronically signed by Jodelle Red MD Signature Date/Time: 02/20/2021/1:32:52 PM    Final    US Abdomen Limited RUQ (LIVER/GB)  Result Date: 02/19/2021 CLINICAL DATA:  Cirrhosis. EXAM: ULTRASOUND ABDOMEN LIMITED RIGHT UPPER QUADRANT COMPARISON:  None. FINDINGS: Gallbladder: Partially distended. No visualized gallstones. Diffuse gallbladder wall thickening at 6 mm. No sonographic Murphy sign noted by sonographer. Common bile duct: Diameter: 5 mm, normal. Liver: Heterogeneous, coarsened, and diffusely increased in parenchymal echogenicity. The liver parenchyma is difficult to penetrate. Allowing for this, no focal lesion is seen. There may be a subtle capsular nodularity. Portal vein is patent on color Doppler imaging with normal direction of blood flow towards the liver. Other: No right upper quadrant ascites. IMPRESSION: 1. Heterogeneous, coarsened, and increased hepatic parenchymal echogenicity. There may be mild capsular nodularity. Findings consistent with steatosis and possible cirrhosis. 2. Liver parenchyma is difficult to penetrate, no focal lesion is seen. 3. Mild gallbladder wall thickening is nonspecific in the setting of chronic liver disease. No gallstones. No biliary dilatation. Electronically Signed   By: Narda Rutherford M.D.   On: 02/19/2021 21:05

## 2021-02-24 NOTE — Progress Notes (Signed)
Occupational Therapy Treatment Patient Details Name: Colin Stevens MRN: [id number] DOB: [birth year] Today's Date: 02/24/2021   History of present illness Pt adm 10/22 with acute hypoxic resp failure, etoh withdrawal and HTN urgency. PMH - HTN, etoh abuse   OT comments  Pt able to progress OOB today with Mod A x 2 via Stedy for first successful transfer to recliner. Pt remains limited by anxiety (fear of falling), pain and shakiness in standing. Once in [place], pt able to demo sit to stands from [place] pad with min guard for brief periods of time. Recommended nursing staff to use maximove for safe transfer back to bed. Plan to further progress functional transfers, sitting balance and endurance during ADLs. Continue to believe pt is a strong candidate for CIR for intensive therapies to return to independence.   SpO2 92-93% on RA   Recommendations for follow up therapy are one component of a multi-disciplinary discharge planning process, led by the attending physician.  Recommendations may be updated based on patient status, additional functional criteria and insurance authorization.    Follow Up Recommendations  Acute inpatient rehab (3hours/day)    Assistance Recommended at Discharge Frequent or constant Supervision/Assistance  Equipment Recommendations  Other (comment) (to be determined pending progress)    Recommendations for Other Services      Precautions / Restrictions Precautions Precautions: Fall Precaution Comments: monitor O2, fearful of falling, L elbow gout Restrictions Weight Bearing Restrictions: No       Mobility Bed Mobility Overal bed mobility: Needs Assistance Bed Mobility: Supine to Sit     Supine to sit: Mod assist;HOB elevated     General bed mobility comments: Mod A x 1 to sit EOB, consistent cues needed for sequencing to advance B LE to EOB, assist to lift trunk with pt guidance to use bedrail    Transfers Overall transfer level: Needs  assistance Equipment used: Ambulation equipment used Transfers: Sit to/from Stand Sit to Stand: Mod assist;+2 physical assistance;+2 safety/equipment;From elevated surface           General transfer comment: Mod A x 2 for sit to stand from elevated bed. Able to hold first stand approx 15-20 seconds and able to complete 3 more stands from [employer] pads with min guard Transfer via Lift Equipment: Stedy   Balance Overall balance assessment: Needs assistance Sitting-balance support: Bilateral upper extremity supported;Feet supported Sitting balance-Leahy Scale: Poor Sitting balance - Comments: reliant on UE support EOB to maintain balance Postural control: Posterior lean Standing balance support: Bilateral upper extremity supported Standing balance-Leahy Scale: Poor Standing balance comment: reliant on UE support and external assist                           ADL either performed or assessed with clinical judgement   ADL Overall ADL's : Needs assistance/impaired                                       General ADL Comments: Session focused on progression of successful OOB transfer in order to progress and translate to ADLs in next sessions. PT present with pt noted with tight BLE - discussed previous strategies for LB dressing (shoes, socks) due to difficulty reaching feet     Vision   Vision Assessment?: No apparent visual deficits   Perception     Praxis      Cognition Arousal/Alertness: Awake/alert  Behavior During Therapy: Anxious;Flat affect Overall Cognitive Status: Impaired/Different from baseline Area of Impairment: Attention;Following commands;Problem solving;Awareness;Orientation;Memory;Safety/judgement                 Orientation Level: Disoriented to;Place;Time Current Attention Level: Sustained Memory: Decreased short-term memory Following Commands: Follows one step commands with increased time Safety/Judgement: Decreased awareness  of safety;Decreased awareness of deficits Awareness: Intellectual Problem Solving: Slow processing;Decreased initiation;Requires verbal cues;Requires tactile cues;Difficulty sequencing General Comments: Flat affect, Pleasant and follows directions with increased time. Limited by anxiety and fear of falling. able to demo improving ability to converse about topics he likes (music) though still some difficulty with word finding          Exercises     Shoulder Instructions       General Comments SpO2 92-93% on RA during tasks    Pertinent Vitals/ Pain       Pain Assessment: Faces Faces Pain Scale: Hurts even more Pain Location: R LE (above and below knee) Pain Descriptors / Indicators: Grimacing;Guarding;Moaning Pain Intervention(s): Monitored during session;Limited activity within patient's tolerance  Home Living                                          Prior Functioning/Environment              Frequency  Min 2X/week        Progress Toward Goals  OT Goals(current goals can now be found in the care plan section)  Progress towards OT goals: Progressing toward goals  Acute Rehab OT Goals Patient Stated Goal: increase independence, be able to walk again OT Goal Formulation: With patient/family Time For Goal Achievement: 03/07/21 Potential to Achieve Goals: Good ADL Goals Pt Will Perform Grooming: with modified independence;sitting Pt Will Perform Lower Body Bathing: with min assist;sit to/from stand Pt Will Transfer to Toilet: with min assist;stand pivot transfer;bedside commode Additional ADL Goal #1: Pt to demo ability to sit EOB > 5 min during functional tasks with no more than Supervision to maintain balance  Plan Discharge plan remains appropriate    Co-evaluation    PT/OT/SLP Co-Evaluation/Treatment: Yes Reason for Co-Treatment: For patient/therapist safety;To address functional/ADL transfers;Complexity of the patient's impairments  (multi-system involvement)   OT goals addressed during session: ADL's and self-care;Other (comment) (functional transfers)      AM-PAC OT "6 Clicks" Daily Activity     Outcome Measure   Help from another person eating meals?: A Little Help from another person taking care of personal grooming?: A Little Help from another person toileting, which includes using toliet, bedpan, or urinal?: Total Help from another person bathing (including washing, rinsing, drying)?: A Lot Help from another person to put on and taking off regular upper body clothing?: A Little Help from another person to put on and taking off regular lower body clothing?: Total 6 Click Score: 13    End of Session Equipment Utilized During Treatment: Gait belt;Other (comment) Antony Salmon)  OT Visit Diagnosis: Unsteadiness on feet (R26.81);Other abnormalities of gait and mobility (R26.89);Muscle weakness (generalized) (M62.81);Other symptoms and signs involving cognitive function   Activity Tolerance Patient tolerated treatment well   Patient Left in chair;with call bell/phone within reach;with chair alarm set;with family/visitor present   Nurse Communication Mobility status;Need for lift equipment        Time: 1330-1403 OT Time Calculation (min): 33 min  Charges: OT General Charges $OT Visit: 1 Visit  OT Treatments $Therapeutic Activity: 8-22 mins  Bradd Canary, OTR/L Acute Rehab Services Office: [phone]   Lorre Munroe 02/24/2021, 2:31 PM

## 2021-02-24 NOTE — Consult Note (Signed)
Reason for Consult:Left elbow pain Referring Physician: Susa Raring Time called: [id number] Time at bedside: 0911   Colin Stevens is an 50 y.o. male.  HPI: Emperor c/o left elbow pain since Tuesday or yesterday. It began insidiously and has steadily gotten worse. It's accompanied by redness. He is in the hospital with CHF. He denies prior hx/o similar or fevers, chills, sweats, N/V though does admit to a hx/o gout but only in his LE. He is RHD.  Past Medical History:  Diagnosis Date   Essential hypertension 02/19/2021   Nicotine dependence, cigarettes, uncomplicated 02/19/2021    History reviewed. No pertinent surgical history.  Family History  Problem Relation Age of Onset   Heart disease Father     Social History:  reports that he has been smoking cigarettes. He has been smoking an average of 1 pack per day. He has never used smokeless tobacco. He reports current alcohol use of about 77.0 standard drinks per week. He reports that he does not use drugs.  Allergies:  Allergies  Allergen Reactions   Aleve [Naproxen] Rash    Medications: I have reviewed the patient's current medications.  Results for orders placed or performed during the hospital encounter of 02/19/21 (from the past 48 hour(s))  Magnesium     Status: None   Collection Time: 02/23/21  1:58 AM  Result Value Ref Range   Magnesium 1.9 1.7 - 2.4 mg/dL    Comment: Performed at [facility], [address], [place], [place] [address]  Comprehensive metabolic panel     Status: Abnormal   Collection Time: 02/23/21  1:58 AM  Result Value Ref Range   Sodium 130 (L) 135 - 145 mmol/L   Potassium 4.1 3.5 - 5.1 mmol/L   Chloride 89 (L) 98 - 111 mmol/L   CO2 33 (H) 22 - 32 mmol/L   Glucose, Bld 97 70 - 99 mg/dL    Comment: Glucose reference range applies only to samples taken after fasting for at least 8 hours.   BUN 14 6 - 20 mg/dL   Creatinine, Ser 8.25 0.61 - 1.24 mg/dL   Calcium 9.1 8.9 - 00.3 mg/dL   Total  Protein 6.5 6.5 - 8.1 g/dL   Albumin 3.4 (L) 3.5 - 5.0 g/dL   AST 21 15 - 41 U/L   ALT 24 0 - 44 U/L   Alkaline Phosphatase 45 38 - 126 U/L   Total Bilirubin 1.7 (H) 0.3 - 1.2 mg/dL   GFR, Estimated >70 >48 mL/min    Comment: (NOTE) Calculated using the CKD-EPI Creatinine Equation (2021)    Anion gap 8 5 - 15    Comment: Performed at [facility], [address], [place], [place] [address]  Brain natriuretic peptide     Status: Abnormal   Collection Time: 02/23/21  1:58 AM  Result Value Ref Range   B Natriuretic Peptide 208.9 (H) 0.0 - 100.0 pg/mL    Comment: Performed at [facility], [address] [place], [place] [address]  CBC with Differential/Platelet     Status: Abnormal   Collection Time: 02/23/21  1:58 AM  Result Value Ref Range   WBC 10.1 4.0 - 10.5 K/uL   RBC 4.17 (L) 4.22 - 5.81 MIL/uL   Hemoglobin 14.3 13.0 - 17.0 g/dL   HCT 88.8 28.0 - 03.4 %   MCV 103.6 (H) 80.0 - 100.0 fL   MCH 34.3 (H) 26.0 - 34.0 pg   MCHC 33.1  30.0 - 36.0 g/dL   RDW 06.3 01.6 - 01.0 %   Platelets 151 150 - 400 K/uL   nRBC 0.0 0.0 - 0.2 %   Neutrophils Relative % 84 %   Neutro Abs 8.5 (H) 1.7 - 7.7 K/uL   Lymphocytes Relative 6 %   Lymphs Abs 0.6 (L) 0.7 - 4.0 K/uL   Monocytes Relative 9 %   Monocytes Absolute 0.9 0.1 - 1.0 K/uL   Eosinophils Relative 1 %   Eosinophils Absolute 0.1 0.0 - 0.5 K/uL   Basophils Relative 0 %   Basophils Absolute 0.0 0.0 - 0.1 K/uL   Immature Granulocytes 0 %   Abs Immature Granulocytes 0.02 0.00 - 0.07 K/uL    Comment: Performed at [facility], [address], [place], [place] [address]  Uric acid     Status: None   Collection Time: 02/23/21  1:58 AM  Result Value Ref Range   Uric Acid, Serum 4.4 3.7 - 8.6 mg/dL    Comment: Performed at [facility], [address], [place], [place] [address]  Sodium, urine, random     Status: None   Collection Time: 02/23/21  7:37 AM  Result Value Ref Range   Sodium, Ur 123 mmol/L     Comment: Performed at [facility], [address], [place], [place] [address]  Osmolality, urine     Status: None   Collection Time: 02/23/21  7:37 AM  Result Value Ref Range   Osmolality, Ur 719 300 - 900 mOsm/kg    Comment: Performed at [facility], [address], [place], [place] [address]  Creatinine, urine, random     Status: None   Collection Time: 02/23/21  7:37 AM  Result Value Ref Range   Creatinine, Urine 165.42 mg/dL    Comment: Performed at [facility], [address], [place], [place] [address]  Osmolality     Status: None   Collection Time: 02/23/21  8:29 AM  Result Value Ref Range   Osmolality 276 275 - 295 mOsm/kg    Comment: Performed at [facility], [address], [place], [place] [address]  Magnesium     Status: None   Collection Time: 02/24/21  1:53 AM  Result Value Ref Range   Magnesium 1.8 1.7 - 2.4 mg/dL    Comment: Performed at [facility], [address], [place], [place] [address]  Comprehensive metabolic panel     Status: Abnormal   Collection Time: 02/24/21  1:53 AM  Result Value Ref Range   Sodium 128 (L) 135 - 145 mmol/L   Potassium 4.1 3.5 - 5.1 mmol/L   Chloride 91 (L) 98 - 111 mmol/L   CO2 31 22 - 32 mmol/L   Glucose, Bld 98 70 - 99 mg/dL    Comment: Glucose reference range applies only to samples taken after fasting for at least 8 hours.   BUN 10 6 - 20 mg/dL   Creatinine, Ser 7.10 0.61 - 1.24 mg/dL   Calcium 8.8 (L) 8.9 - 10.3 mg/dL   Total Protein 6.0 (L) 6.5 - 8.1 g/dL   Albumin 3.0 (L) 3.5 - 5.0 g/dL   AST 17 15 - 41 U/L   ALT 20 0 - 44 U/L   Alkaline Phosphatase 38 38 - 126 U/L   Total Bilirubin 1.2 0.3 - 1.2 mg/dL   GFR, Estimated >62 >69 mL/min    Comment: (NOTE) Calculated using the  CKD-EPI Creatinine Equation (2021)    Anion gap 6 5 - 15    Comment: Performed at [facility], [address], [place], [place] [address]  CBC with Differential/Platelet     Status: Abnormal   Collection  Time: 02/24/21  1:53 AM  Result Value Ref Range   WBC 10.2 4.0 - 10.5 K/uL   RBC 3.80 (L) 4.22 - 5.81 MIL/uL   Hemoglobin 13.4 13.0 - 17.0 g/dL   HCT 46.2 70.3 - 50.0 %   MCV 102.6 (H) 80.0 - 100.0 fL   MCH 35.3 (H) 26.0 - 34.0 pg   MCHC 34.4 30.0 - 36.0 g/dL   RDW 93.8 18.2 - 99.3 %   Platelets 162 150 - 400 K/uL   nRBC 0.0 0.0 - 0.2 %   Neutrophils Relative % 81 %   Neutro Abs 8.3 (H) 1.7 - 7.7 K/uL   Lymphocytes Relative 6 %   Lymphs Abs 0.6 (L) 0.7 - 4.0 K/uL   Monocytes Relative 10 %   Monocytes Absolute 1.0 0.1 - 1.0 K/uL   Eosinophils Relative 2 %   Eosinophils Absolute 0.2 0.0 - 0.5 K/uL   Basophils Relative 1 %   Basophils Absolute 0.1 0.0 - 0.1 K/uL   Immature Granulocytes 0 %   Abs Immature Granulocytes 0.03 0.00 - 0.07 K/uL    Comment: Performed at [facility], [address] [address], [place], [place] [address]  Brain natriuretic peptide     Status: Abnormal   Collection Time: 02/24/21  1:53 AM  Result Value Ref Range   B Natriuretic Peptide 162.3 (H) 0.0 - 100.0 pg/mL    Comment: Performed at [facility], [address] [place], [place] [address]  Procalcitonin - Baseline     Status: None   Collection Time: 02/24/21  1:53 AM  Result Value Ref Range   Procalcitonin <0.10 ng/mL    Comment:        Interpretation: PCT (Procalcitonin) <= 0.5 ng/mL: Systemic infection (sepsis) is not likely. Local bacterial infection is possible. (NOTE)       Sepsis PCT Algorithm           Lower Respiratory Tract                                      Infection PCT Algorithm    ----------------------------     ----------------------------         PCT < 0.25 ng/mL                PCT < 0.10 ng/mL          Strongly encourage             Strongly discourage   discontinuation of antibiotics    initiation of antibiotics    ----------------------------     -----------------------------       PCT 0.25 - 0.50 ng/mL            PCT 0.10 - 0.25 ng/mL               OR       >80%  decrease in PCT            Discourage initiation of  antibiotics      Encourage discontinuation           of antibiotics    ----------------------------     -----------------------------         PCT >= 0.50 ng/mL              PCT 0.26 - 0.50 ng/mL               AND        <80% decrease in PCT             Encourage initiation of                                             antibiotics       Encourage continuation           of antibiotics    ----------------------------     -----------------------------        PCT >= 0.50 ng/mL                  PCT > 0.50 ng/mL               AND         increase in PCT                  Strongly encourage                                      initiation of antibiotics    Strongly encourage escalation           of antibiotics                                     -----------------------------                                           PCT <= 0.25 ng/mL                                                 OR                                        > 80% decrease in PCT                                      Discontinue / Do not initiate                                             antibiotics  Performed at [facility], [address] [place], [place] [address]     DG ELBOW COMPLETE LEFT (3+VIEW)  Result Date: 02/24/2021 CLINICAL  DATA:  LEFT posterior elbow pain EXAM: LEFT ELBOW - COMPLETE 3+ VIEW COMPARISON:  None. FINDINGS: No evidence of fracture of the ulna or humerus. The radial head is normal. No joint effusion. IMPRESSION: No fracture or dislocation. Electronically Signed   By: Genevive Bi M.D.   On: 02/24/2021 09:06   DG Chest Port 1 View  Result Date: 02/24/2021 CLINICAL DATA:  Shortness of breath. EXAM: PORTABLE CHEST 1 VIEW COMPARISON:  02/21/2021 FINDINGS: 0732 hours. Low volume film. The cardio pericardial silhouette is enlarged. Interstitial markings are diffusely coarsened with chronic features.  The lungs are clear without focal pneumonia, edema, pneumothorax or pleural effusion. The visualized bony structures of the thorax show no acute abnormality. Telemetry leads overlie the chest. IMPRESSION: Low volume film with chronic interstitial coarsening. Electronically Signed   By: Kennith Center M.D.   On: 02/24/2021 07:50    Review of Systems  Constitutional:  Negative for chills, diaphoresis and fever.  HENT:  Negative for ear discharge, ear pain, hearing loss and tinnitus.   Eyes:  Negative for photophobia and pain.  Respiratory:  Positive for shortness of breath. Negative for cough.   Cardiovascular:  Negative for chest pain.  Gastrointestinal:  Negative for abdominal pain, nausea and vomiting.  Genitourinary:  Negative for dysuria, flank pain, frequency and urgency.  Musculoskeletal:  Positive for arthralgias (Left elbow). Negative for back pain, myalgias and neck pain.  Neurological:  Negative for dizziness and headaches.  Hematological:  Does not bruise/bleed easily.  Psychiatric/Behavioral:  The patient is not nervous/anxious.   Blood pressure (!) 145/82, pulse (!) 106, temperature 98.1 F (36.7 C), temperature source Oral, resp. rate 18, height 6\' 4"  (1.93 m), weight (!) 149.7 kg, SpO2 95 %. Physical Exam Constitutional:      General: He is not in acute distress.    Appearance: He is well-developed. He is not diaphoretic.  HENT:     Head: Normocephalic and atraumatic.  Eyes:     General: No scleral icterus.       Right eye: No discharge.        Left eye: No discharge.     Conjunctiva/sclera: Conjunctivae normal.  Cardiovascular:     Rate and Rhythm: Normal rate and regular rhythm.  Pulmonary:     Effort: Pulmonary effort is normal. No respiratory distress.  Musculoskeletal:     Cervical back: Normal range of motion.     Comments: Left shoulder, elbow, wrist, digits- no skin wounds, mild posterior elbow TTP, posterior elbow erythema, warmth, painless AROM from 45-170, no  instability, no blocks to motion  Sens  Ax/R/M/U intact  Mot   Ax/ R/ PIN/ M/ AIN/ U intact  Rad 2+  Skin:    General: Skin is warm and dry.  Neurological:     Mental Status: He is alert.  Psychiatric:        Mood and Affect: Mood normal.        Behavior: Behavior normal.    Assessment/Plan: Left elbow pain -- His excellent ROM is reassuring to r/o septic joint. Gout is very likely as is cellulitis (as his bursa is not enlarged I think bursitis not likely). Would begin to treat empirically for both gout and cellulitis. I am reluctant to aspirate given lack of good window for needle placement that would avoid erythema. Dr. to evaluate later today.    Blanchie Dessert, PA-C Orthopedic Surgery [phone] 02/24/2021, 9:23 AM

## 2021-02-25 LAB — COMPREHENSIVE METABOLIC PANEL
ALT: 22 U/L (ref 0–44)
AST: 21 U/L (ref 15–41)
Albumin: 3.1 g/dL — ABNORMAL LOW (ref 3.5–5.0)
Alkaline Phosphatase: 46 U/L (ref 38–126)
Anion gap: 7 (ref 5–15)
BUN: 10 mg/dL (ref 6–20)
CO2: 29 mmol/L (ref 22–32)
Calcium: 9 mg/dL (ref 8.9–10.3)
Chloride: 93 mmol/L — ABNORMAL LOW (ref 98–111)
Creatinine, Ser: 0.56 mg/dL — ABNORMAL LOW (ref 0.61–1.24)
GFR, Estimated: >60 mL/min (ref >60)
Glucose, Bld: 123 mg/dL — ABNORMAL HIGH (ref 70–99)
Potassium: 4.1 mmol/L (ref 3.5–5.1)
Sodium: 129 mmol/L — ABNORMAL LOW (ref 135–145)
Total Bilirubin: 0.6 mg/dL (ref 0.3–1.2)
Total Protein: 6.5 g/dL (ref 6.5–8.1)

## 2021-02-25 LAB — CBC WITH DIFFERENTIAL/PLATELET
Abs Immature Granulocytes: 0.03 10*3/uL (ref 0.00–0.07)
Basophils Absolute: 0 10*3/uL (ref 0.0–0.1)
Basophils Relative: 0 %
Eosinophils Absolute: 0 10*3/uL (ref 0.0–0.5)
Eosinophils Relative: 0 %
HCT: 40.4 % (ref 39.0–52.0)
Hemoglobin: 14 g/dL (ref 13.0–17.0)
Immature Granulocytes: 0 %
Lymphocytes Relative: 4 %
Lymphs Abs: 0.3 10*3/uL — ABNORMAL LOW (ref 0.7–4.0)
MCH: 35.1 pg — ABNORMAL HIGH (ref 26.0–34.0)
MCHC: 34.7 g/dL (ref 30.0–36.0)
MCV: 101.3 fL — ABNORMAL HIGH (ref 80.0–100.0)
Monocytes Absolute: 1 10*3/uL (ref 0.1–1.0)
Monocytes Relative: 11 %
Neutro Abs: 7.3 10*3/uL (ref 1.7–7.7)
Neutrophils Relative %: 85 %
Platelets: 196 10*3/uL (ref 150–400)
RBC: 3.99 MIL/uL — ABNORMAL LOW (ref 4.22–5.81)
RDW: 11.9 % (ref 11.5–15.5)
WBC: 8.6 10*3/uL (ref 4.0–10.5)
nRBC: 0 % (ref 0.0–0.2)

## 2021-02-25 LAB — BRAIN NATRIURETIC PEPTIDE: B Natriuretic Peptide: 448.8 pg/mL — ABNORMAL HIGH (ref 0.0–100.0)

## 2021-02-25 LAB — MAGNESIUM: Magnesium: 1.9 mg/dL (ref 1.7–2.4)

## 2021-02-25 LAB — PROCALCITONIN: Procalcitonin: <0.1 ng/mL

## 2021-02-25 MED ORDER — CEPHALEXIN 500 MG PO CAPS
500.0000 mg | ORAL_CAPSULE | Freq: Four times a day (QID) | ORAL | Status: DC
  Administered 2021-02-25 – 2021-02-28 (×11): 500 mg via ORAL
  Filled 2021-02-25 (×10): qty 1

## 2021-02-25 MED ORDER — TOLVAPTAN 15 MG PO TABS
15.0000 mg | ORAL_TABLET | Freq: Once | ORAL | Status: AC
  Administered 2021-02-25: 15 mg via ORAL
  Filled 2021-02-25: qty 1

## 2021-02-25 MED ORDER — METHYLPREDNISOLONE SODIUM SUCC 125 MG IJ SOLR
80.0000 mg | INTRAMUSCULAR | Status: AC
  Administered 2021-02-25: 80 mg via INTRAVENOUS
  Filled 2021-02-25: qty 2

## 2021-02-25 MED ORDER — COLCHICINE 0.6 MG PO TABS
0.6000 mg | ORAL_TABLET | Freq: Every day | ORAL | Status: DC
  Administered 2021-02-25: 0.6 mg via ORAL
  Filled 2021-02-25: qty 1

## 2021-02-25 MED ORDER — CHLORDIAZEPOXIDE HCL 5 MG PO CAPS: 15.0000 mg | ORAL_CAPSULE | Freq: Three times a day (TID) | ORAL | Status: DC

## 2021-02-25 MED ORDER — CHLORDIAZEPOXIDE HCL 5 MG PO CAPS
15.0000 mg | ORAL_CAPSULE | Freq: Three times a day (TID) | ORAL | Status: DC
  Administered 2021-02-25 – 2021-02-27 (×7): 15 mg via ORAL
  Filled 2021-02-25 (×6): qty 3

## 2021-02-25 NOTE — Plan of Care (Signed)
  Problem: Education: Goal: Knowledge of General Education information will improve Description: Including pain rating scale, medication(s)/side effects and non-pharmacologic comfort measures Outcome: Progressing   Problem: Health Behavior/Discharge Planning: Goal: Ability to manage health-related needs will improve Outcome: Progressing   Problem: Clinical Measurements: Goal: Ability to maintain clinical measurements within normal limits will improve Outcome: Progressing Goal: Will remain free from infection Outcome: Progressing Goal: Diagnostic test results will improve Outcome: Progressing Goal: Respiratory complications will improve Outcome: Progressing Goal: Cardiovascular complication will be avoided Outcome: Progressing   Problem: Pain Managment: Goal: General experience of comfort will improve Outcome: Progressing   Problem: Elimination: Goal: Will not experience complications related to bowel motility Outcome: Progressing Goal: Will not experience complications related to urinary retention Outcome: Progressing   Problem: Safety: Goal: Ability to remain free from injury will improve Outcome: Progressing   Problem: Skin Integrity: Goal: Risk for impaired skin integrity will decrease Outcome: Progressing

## 2021-02-25 NOTE — PMR Pre-admission (Signed)
PMR Admission Coordinator Pre-Admission Assessment  Patient: Colin Stevens is an 50 y.o., male MRN: [id number] DOB: April 26, 1971 Height: 6' 4"  (193 cm) Weight: (!) 141 kg  Insurance Information HMO:     PPO:      PCP:      IPA:      80/20:      OTHER:  PRIMARY: uninsured     Estimate of cost of care provided to patient and wife on 02/25/2021    Financial Counselor: First source assessing for disability and Medicaid  The Therapist, art Information Summary" for patients in Inpatient Rehabilitation Facilities with attached "Privacy Act [place] Records" was provided and verbally reviewed with: N/A  Emergency Contact Information Contact Information     Name Relation Home Work [place] Spouse [phone]  [phone]   [name] Mother [phone]        Current Medical History  Patient Admitting Diagnosis: Debility  History of Present Illness: 50 year old male with history of HTN and alcohol abuse. Presented on 02/19/2021 with shortness of breath and increasing tremors/DT's over 3 weeks prior to admit. He had fallen at work with a back injury and then over time became weaker, with multiple falls and increasing shortness of breath. He has history of heavy alcohol use but has not been able to drink as much due to injury. In the ED found to be hypoxic and CXR revealed pulmonary edema. MRI reveled no evidence of acute disease.   Acute hypoxic respiratory failure due to acute on chronic CHF. EF 60%. Given IV  Lasix, and beta blocker. For his DT'S began CIWA protocol, added catapres. For borderline B12 deficiency added replacement , as well as thiamine and folic acid. For his hypertensive emergency, treating DT's and meds adjusted for his BP. Mild L5 left sided nerve compression per MRI. Ultrasound suggestive of cirrhosis with thrombocytopenia. Hyponatremia. Initially CHF and diuresed. Some question of developing SIADH.   Left elbow redness and pain with Ortho  consulted. Placed on Empiric IV antibiotics and one dose of systemic steroid. Lovenox for DVT prophylaxis.  Patient's medical record from [facility] has been reviewed by the rehabilitation admission coordinator and physician.  Past Medical History  Past Medical History:  Diagnosis Date   Essential hypertension 02/19/2021   Nicotine dependence, cigarettes, uncomplicated 00/93/8182   Has the patient had major surgery during 100 days prior to admission? No  Family History   family history includes Cancer in his maternal grandfather; Heart disease in his father.  Current Medications  Current Facility-Administered Medications:    acetaminophen (TYLENOL) tablet 650 mg, 650 mg, Oral, Q6H PRN, 650 mg at 03/01/21 1023 **OR** acetaminophen (TYLENOL) suppository 650 mg, 650 mg, Rectal, Q6H PRN, Shalhoub, Sherryll Burger, MD   albuterol (PROVENTIL) (2.5 MG/3ML) 0.083% nebulizer solution 2.5 mg, 2.5 mg, Nebulization, Q6H PRN, Thurnell Lose, MD, 2.5 mg at 02/21/21 0834   carvedilol (COREG) tablet 12.5 mg, 12.5 mg, Oral, BID WC, Lala Lund K, MD, 12.5 mg at 03/01/21 9937   chlordiazePOXIDE (LIBRIUM) capsule 5 mg, 5 mg, Oral, BID, Thurnell Lose, MD   colchicine tablet 0.6 mg, 0.6 mg, Oral, BID, Lala Lund K, MD, 0.6 mg at 03/01/21 1696   diclofenac Sodium (VOLTAREN) 1 % topical gel 2 g, 2 g, Topical, TID PRN, Thurnell Lose, MD   enoxaparin (LOVENOX) injection 75 mg, 75 mg, Subcutaneous, Q24H, Thurnell Lose, MD, 75 mg at 78/93/81 0175   folic acid (FOLVITE) tablet 1 mg,  1 mg, Oral, Daily, Shalhoub, Sherryll Burger, MD, 1 mg at 03/01/21 0927   lisinopril (ZESTRIL) tablet 5 mg, 5 mg, Oral, Daily, Thurnell Lose, MD, 5 mg at 03/01/21 5027   loperamide (IMODIUM) capsule 2 mg, 2 mg, Oral, Q6H PRN, Thurnell Lose, MD, 2 mg at 02/21/21 1216   [START ON 03/02/2021] methylPREDNISolone sodium succinate (SOLU-MEDROL) 40 mg/mL injection 25 mg, 25 mg, Intravenous, Q24H, Thurnell Lose,  MD   multivitamin with minerals tablet 1 tablet, 1 tablet, Oral, Daily, Shalhoub, Sherryll Burger, MD, 1 tablet at 03/01/21 7412   nicotine (NICODERM CQ - dosed in mg/24 hours) patch 21 mg, 21 mg, Transdermal, Daily, Shalhoub, Sherryll Burger, MD, 21 mg at 03/01/21 0931   nystatin (MYCOSTATIN) 100000 UNIT/ML suspension 500,000 Units, 5 mL, Mouth/Throat, QID, Thurnell Lose, MD, 500,000 Units at 03/01/21 0932   [DISCONTINUED] ondansetron (ZOFRAN) tablet 4 mg, 4 mg, Oral, Q6H PRN **OR** ondansetron (ZOFRAN) injection 4 mg, 4 mg, Intravenous, Q6H PRN, Shalhoub, Sherryll Burger, MD   pantoprazole (PROTONIX) EC tablet 40 mg, 40 mg, Oral, Daily, Heloise Purpura, RPH, 40 mg at 03/01/21 8786   polyethylene glycol (MIRALAX / GLYCOLAX) packet 17 g, 17 g, Oral, Daily PRN, Vernelle Emerald, MD, 17 g at 02/23/21 1237   sodium chloride tablet 1 g, 1 g, Oral, BID WC, Thurnell Lose, MD, 1 g at 03/01/21 7672   thiamine tablet 100 mg, 100 mg, Oral, Daily, Thurnell Lose, MD, 100 mg at 03/01/21 0947   vitamin B-12 (CYANOCOBALAMIN) tablet 1,000 mcg, 1,000 mcg, Oral, Daily, Thurnell Lose, MD, 1,000 mcg at 03/01/21 0962  Patients Current Diet:  Diet Order             DIET SOFT Room service appropriate? Yes; Fluid consistency: Thin; Fluid restriction: 1500 mL Fluid  Diet effective now                  Precautions / Restrictions Precautions Precautions: Fall, Other (comment) Precaution Comments: monitor O2, fearful of falling Restrictions Weight Bearing Restrictions: No   Has the patient had 2 or more falls or a fall with injury in the past year? 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 Independent  Indoor Mobility: Did the patient need assistance with walking from room to room (with or without device)?  Independent  Stairs: Did the patient need assistance with internal or external stairs (with or without device)? Independent  Functional Cognition: Did the patient need help planning regular tasks such as shopping or remembering to take medications? Independent  Patient Information Are you of Hispanic, Latino/a,or Spanish origin?: A. No, not of Hispanic, Latino/a, or Spanish origin What is your race?: A. White Do you need or want an interpreter to communicate with a doctor or health care staff?: 0. No  Patient's Response To:  Health Literacy and Transportation Is the patient able to respond to health literacy and transportation needs?: Yes Health Literacy - How often do you need to have someone help you when you read instructions, pamphlets, or other written material from your doctor or pharmacy?: Never In the past 12 months, has lack of transportation kept you from medical appointments or from getting medications?: No In the past 12 months, has lack of transportation kept you from meetings, work, or from  getting things needed for daily living?: No  Home Assistive Devices / [place] Devices/Equipment: None Home Equipment: None  Prior Device Use: Indicate devices/aids used by the patient prior to current illness, exacerbation or injury? None of the above  Current Functional Level Cognition  Overall Cognitive Status: Impaired/Different from baseline Current Attention Level: Sustained Orientation Level: Oriented X4 Following Commands: Follows one step commands with increased time Safety/Judgement: Decreased awareness of safety, Decreased awareness of deficits General Comments: followed commands and less fear of falling    Extremity Assessment (includes Sensation/Coordination)  Upper Extremity Assessment: Defer to OT evaluation LUE Deficits / Details: L elbow swollen and red. Per wife, pt had pulled IV out overnight and she wonders if he bumped his elbow on bedrail.  sensitive to touch but ROM functional. Per MD, suspicious for gout  Lower Extremity Assessment: Defer to PT evaluation RLE Deficits / Details: Grossly 2+ to 3-/5 LLE Deficits / Details: Grossly 2+ to 3-/5    ADLs  Overall ADL's : Needs assistance/impaired Eating/Feeding: Set up, Bed level Grooming: Bed level, Supervision/safety Upper Body Bathing: Minimal assistance, Sitting Lower Body Bathing: Maximal assistance, +2 for physical assistance, +2 for safety/equipment, Sit to/from stand Upper Body Dressing : Minimal assistance, Sitting Upper Body Dressing Details (indicate cue type and reason): donned gown seated on eob Lower Body Dressing: Maximal assistance, +2 for physical assistance, +2 for safety/equipment, Sit to/from stand Toileting- Clothing Manipulation and Hygiene: Total assistance, Bed level Functional mobility during ADLs: Minimal assistance, Rolling walker (2 wheels) General ADL Comments: demonstrated decreased fear of falling    Mobility  Overal bed mobility: Needs Assistance Bed Mobility: Supine to Sit Supine to sit: Min guard, HOB elevated Sit to supine: Mod assist General bed mobility comments: +rail, increased time    Transfers  Overall transfer level: Needs assistance Equipment used: Rolling walker (2 wheels) Transfer via Lift Equipment: Stedy Transfers: Sit to/from Stand Sit to Stand: Min assist, +2 safety/equipment, +2 physical assistance Stand pivot transfers: Min assist General transfer comment: initially +2 min to power up to stand. Progressed to min assist +2 safety, cues for sequencing    Ambulation / Gait / Stairs / Wheelchair Mobility  Ambulation/Gait Ambulation/Gait assistance: +2 safety/equipment, +2 physical assistance, Min assist Gait Distance (Feet): 100 Feet (+ 25') Assistive device: Rolling walker (2 wheels) Gait Pattern/deviations: Step-through pattern, Decreased stride length General Gait Details: slow, guard gait; trembling/shaking noted.  Initially ambulated in room 25' with RW +2 min assist. This helped build pt's confidence for gait trial in hallway. Pt able to amb in hall 100' min assist with RW, +2 safety/close chair follow Gait velocity: decreased Gait velocity interpretation: <1.8 ft/sec, indicate of risk for recurrent falls Pre-gait activities: Tolerated 3x 15-20-sec bouts of standing in stedy with BUE support on stedy bar    Posture / Balance Dynamic Sitting Balance Sitting balance - Comments: able to sit on eob without assistance Balance Overall balance assessment: Needs assistance Sitting-balance support: No upper extremity supported, Feet supported Sitting balance-Leahy Scale: Fair Sitting balance - Comments: able to sit on eob without assistance Postural control: Posterior lean Standing balance support: Bilateral upper extremity supported, During functional activity Standing balance-Leahy Scale: Poor Standing balance comment: reliant on BUE support    Special needs/care consideration CIWA protocol   Previous Home Environment  Living Arrangements: Spouse/significant other  Lives With: Spouse Available Help at Discharge: Family, Available 24 hours/day (Wife works from home) Type of Home: House Home Layout: One level Home Access: Stairs to enter  Entrance Stairs-Rails: Right, Left Entrance Stairs-Number of Steps: 4 Bathroom Shower/Tub: Chiropodist: Standard Bathroom Accessibility: Yes How Accessible: Accessible via walker Home Care Services: No  Discharge Living Setting Plans for Discharge Living Setting: Patient's home, Lives with (comment) (wife) Type of Home at Discharge: House Discharge Home Layout: One level Discharge Home Access: Stairs to enter Entrance Stairs-Rails: Right, Left Entrance Stairs-Number of Steps: 4 Discharge Bathroom Shower/Tub: Tub/shower unit Discharge Bathroom Toilet: Standard Discharge Bathroom Accessibility: Yes How Accessible: Accessible via  walker Does the patient have any problems obtaining your medications?: Yes (Describe) (uninsured)  Social/Family/Support Systems Patient Roles: Spouse 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, Government social research officer Anticipated Caregiver: wife Anticipated Ambulance person Information: see contact info Ability/Limitations of Caregiver: works from home; no limitations Caregiver Availability: 24/7 Discharge Plan Discussed with Primary Caregiver: Yes Is Caregiver In Agreement with Plan?: Yes Does Caregiver/Family have Issues with Lodging/Transportation while Pt is in Rehab?: No  Goals Patient/Family Goal for Rehab: supervision to min assist with PT and OT, supervision with SLP Expected length of stay: ELOS 10 to 12 days Additional Information: CIWA protocol Pt/Family Agrees to Admission and willing to participate: Yes Program Orientation Provided & Reviewed with Pt/Caregiver Including Roles  & Responsibilities: Yes  Barriers to Discharge: Insurance for SNF coverage  Decrease burden of Care through IP rehab admission: n/a  Possible need for SNF placement upon discharge: not anticipated  Patient Condition: I have reviewed medical records from [facility], spoken with  patient and spouse. I met with patient at the bedside for inpatient rehabilitation assessment.  Patient will benefit from ongoing PT, OT, and SLP, can actively participate in 3 hours of therapy a day 5 days of the week, and can make measurable gains during the admission.  Patient will also benefit from the coordinated team approach during an Inpatient Acute Rehabilitation admission.  The patient will receive intensive therapy as well as Rehabilitation physician, nursing, social worker, and care management interventions.  Due to bladder management, bowel management, safety, skin/wound care, disease management, medication administration, pain management, and patient education the patient requires 24 hour a day rehabilitation nursing.  The patient is  currently mod to max assist overall with mobility and basic ADLs.  Discharge setting and therapy post discharge at home with home health is anticipated.  Patient has agreed to participate in the Acute Inpatient Rehabilitation Program and will admit today.  Preadmission Screen Completed By: Danne Baxter RN MSN with updates by Michel Santee, 03/01/2021 2:02 PM ______________________________________________________________________   Discussed status with Dr. Dagoberto Ligas on 03/01/21  at 2:02 PM  and received approval for admission today.  Admission Coordinator: Danne Baxter RN MSN with updates by Michel Santee, PT, time 2:03 PM Sudie Grumbling 03/01/21    Assessment/Plan: Diagnosis: Does the need for close, 24 hr/day Medical supervision in concert with the patient's rehab needs make it unreasonable for this patient to be served in a less intensive setting? Yes Co-Morbidities requiring supervision/potential complications: cirrhosis, acute on chronic dCHF- resp failure- acute; constipation;  HTN emergency- improved0 L elbow infectiob; gout Due to bladder management, bowel management, safety, skin/wound care, disease management, medication administration, pain management, and patient education, does the patient require 24 hr/day rehab nursing? Yes Does the patient require coordinated care of a physician, rehab nurse, PT, OT, and SLP to address physical and functional deficits in the context of the above medical diagnosis(es)? Yes Addressing deficits in the following areas: balance, endurance, locomotion, strength, transferring, bowel/bladder control, bathing, dressing, feeding, grooming, toileting,  cognition, and psychosocial support Can the patient actively participate in an intensive therapy program of at least 3 hrs of therapy 5 days a week? Yes The potential for patient to make measurable gains while on inpatient rehab is good Anticipated functional outcomes upon discharge from inpatient rehab:  supervision and min assist PT, supervision and min assist OT, min assist SLP Estimated rehab length of stay to reach the above functional goals is: 10-14 days Anticipated discharge destination: Home 10. Overall Rehab/Functional Prognosis: good   MD Signature:

## 2021-02-25 NOTE — Progress Notes (Signed)
PROGRESS NOTE                                                                                                                                                                                                             Patient Demographics:    Colin Stevens, is a 50 y.o. male, DOB - [birth year], WUJ:[id number]  Outpatient Primary MD for the patient is Rankins, Fanny Dance, MD    LOS - 6  Admit date - 02/19/2021    Chief Complaint  Patient presents with   Shortness of Breath       Brief Narrative (HPI from H&P)  50 year old male with past medical history of hypertension, nicotine dependence, alcohol abuse who presents to [facility] emergency department via EMS with shortness of breath and DTs.   Subjective:   Patient in bed, appears comfortable, denies any headache, no fever, no chest pain or pressure, no shortness of breath , no abdominal pain. No new focal weakness.   Assessment  & Plan :     Acute Hypoxic Resp. Failure due to Acute on Chronic CHF -echocardiogram with EF 60% likely had mild acute on chronic diastolic CHF which has resolved post IV Lasix, continue beta-blocker.  Monitor closely.   2.  DTS - Librium dose adjusted, continue with CIWA protocol, added Catapres, also has borderline B12 deficiency and placed on replacement, also on thiamine and folic acid , seen by neuro. MRI brain unremarkable. Gradually improving.  3.  Hypertensive urgency.  Treating DTs, medications adjusted for blood pressure.  Monitor.  4.  Fall at home.  MRI brain along with spine unremarkable.  Mild L5 left-sided nerve compression which will be monitored, PT OT >> CIR.  5. Ongoing nicotine dependence and alcohol abuse.  Counseled to quit.  6.  Obesity.  BMI of 40.  Follow with PCP for weight loss.  7.  Hypomagnesemia.  Replaced.  8.  Ultrasound suggestive of cirrhosis with thrombocytopenia on ultrasound.  Likely alcoholic  cirrhosis, thrombocytopenia likely due to it.  Negative acute hepatitis panel.  9.  Hyponatremia.  Initially CHF and has been diuresed, diuretics were stopped on 02/22/2021, subsequently no edema and somewhat dehydrated received a liter and a half of IV fluids on 02/23/2021, sodium has dropped further on 02/25/2019 was questioning if he is developing SIADH, urine osmolality remains much higher  than serum, serum uric acid is low normal, post challenge of low-dose Lasix, salt tablets >> add Samsca on 02/25/21.  10.  Left elbow redness and pain.  Most likely gout, according to the wife.  Started on 02/23/2021, Ortho on board, x-ray stable, patient has no recollection of any injury but previous history of gout, uric acid is low normal but could be due to consumption.  Empiric IV antibiotic placed - continue, 1 dose of systemic steroid on 02/25/21 - repeat 02/26/21 , appreciate orthopedic input.  Not enough fluid for an aspiration at this time.      Condition -   Guarded  Family Communication  :  Wife bedside 02/20/21, called [phone] on 02/21/21, 02/23/21, 02/24/21 - bedside  Code Status :  Full  Consults  :  Neuro  PUD Prophylaxis : PPI   Procedures  :     TTE - 1. Left ventricular ejection fraction, by estimation, is 60 to 65%. The left ventricle has normal function. The left ventricle has no regional wall motion abnormalities. Left ventricular diastolic function could not be evaluated.  2. Right ventricular systolic function is normal. The right ventricular size is normal. Tricuspid regurgitation signal is inadequate for assessing PA pressure.  3. The mitral valve is grossly normal. Trivial mitral valve regurgitation. No evidence of mitral stenosis.  4. The aortic valve was not well visualized. There is mild calcification of the aortic valve. Aortic valve regurgitation is not visualized. Mild aortic valve sclerosis is present, with no evidence of aortic valve stenosis.  RUQ [place] -  Cirrhosis  MRI Brain and Spine - non acute MRI brain, MRI spine showing similar L-spine disease with possible mild L5 nerve compression on the left side.       Disposition Plan  :    Status is: Inpatient  Remains inpatient appropriate because: Severe DTs   DVT Prophylaxis  :  Lovenox   Lab Results  Component Value Date   PLT 196 02/25/2021    Diet :  Diet Order             DIET SOFT Room service appropriate? Yes; Fluid consistency: Thin  Diet effective now                    Inpatient Medications  Scheduled Meds:  chlordiazePOXIDE  15 mg Oral TID   cloNIDine  0.1 mg Oral BID   cyanocobalamin  1,000 mcg Subcutaneous Daily   enoxaparin (LOVENOX) injection  75 mg Subcutaneous Q24H   folic acid  1 mg Oral Daily   lisinopril  5 mg Oral Daily   metoprolol tartrate  100 mg Oral BID   multivitamin with minerals  1 tablet Oral Daily   nicotine  21 mg Transdermal Daily   nystatin  5 mL Mouth/Throat QID   pantoprazole  40 mg Oral Daily   sodium chloride  2 g Oral TID WC   thiamine  100 mg Oral Daily   tolvaptan  15 mg Oral Once   [START ON 02/28/2021] vitamin B-12  1,000 mcg Oral Daily   Continuous Infusions:   ceFAZolin (ANCEF) IV 1 g (02/25/21 0605)   PRN Meds:.acetaminophen **OR** acetaminophen, albuterol, diclofenac Sodium, loperamide, LORazepam **OR** LORazepam, [DISCONTINUED] ondansetron **OR** ondansetron (ZOFRAN) IV, polyethylene glycol  Antibiotics  :    Anti-infectives (From admission, onward)    Start     Dose/Rate Route Frequency Ordered Stop   02/24/21 1100  ceFAZolin (ANCEF) IVPB 1 g/50 mL premix  1 g 100 mL/hr over 30 Minutes Intravenous Every 8 hours 02/24/21 0932          Time Spent in minutes  30   Susa Raring M.D on 02/25/2021 at 11:13 AM  To page go to [email or web address]   [facility] -  Office  [phone]  See all Orders from today for further details    Objective:   Vitals:   02/24/21 1956 02/24/21 2330  02/25/21 0332 02/25/21 0742  BP: (!) 154/81 133/69 134/90 131/88  Pulse: (!) 103 (!) 111 (!) 102 87  Resp: 20 18 20 19   Temp: 98.1 F (36.7 C) 98 F (36.7 C) 98 F (36.7 C) 98.1 F (36.7 C)  TempSrc: Oral Oral Oral Oral  SpO2: 93% 94% 95% 97%  Weight:   (!) 149.2 kg   Height:        Wt Readings from Last 3 Encounters:  02/25/21 (!) 149.2 kg     Intake/Output Summary (Last 24 hours) at 02/25/2021 1113 Last data filed at 02/25/2021 4580 Gross per 24 hour  Intake 50 ml  Output 2900 ml  Net -2850 ml     Physical Exam  Awake Alert x2, No new F.N deficits, Mild DTs, L.Elbow is red and tender, mild effusion if any - improved Edison.AT,PERRAL Supple Neck, No JVD,   Symmetrical Chest wall movement, Good air movement bilaterally, CTAB RRR,No Gallops, Rubs or new Murmurs,  +ve B.Sounds, Abd Soft, No tenderness,   No Cyanosis, Clubbing or edema,           Data Review:    CBC Recent Labs  Lab 02/21/21 0320 02/22/21 0214 02/23/21 0158 02/24/21 0153 02/25/21 0218  WBC 12.1* 10.2 10.1 10.2 8.6  HGB 14.9 14.5 14.3 13.4 14.0  HCT 44.6 44.1 43.2 39.0 40.4  PLT 150 137* 151 162 196  MCV 106.7* 107.6* 103.6* 102.6* 101.3*  MCH 35.6* 35.4* 34.3* 35.3* 35.1*  MCHC 33.4 32.9 33.1 34.4 34.7  RDW 12.8 12.6 12.3 12.1 11.9  LYMPHSABS 0.6* 0.7 0.6* 0.6* 0.3*  MONOABS 0.8 0.8 0.9 1.0 1.0  EOSABS 0.0 0.1 0.1 0.2 0.0  BASOSABS 0.0 0.1 0.0 0.1 0.0    Recent Labs  Lab 02/19/21 1327 02/19/21 1444 02/19/21 1951 02/19/21 2100 02/19/21 2201 02/19/21 2259 02/20/21 0110 02/21/21 0320 02/22/21 0214 02/22/21 0529 02/23/21 0158 02/24/21 0153 02/25/21 0218  NA  --   --   --   --    < >  --    < > 131* 132*  --  130* 128* 129*  K  --   --   --   --    < >  --    < > 4.5 4.2  --  4.1 4.1 4.1  CL  --   --   --   --   --   --    < > 92* 92*  --  89* 91* 93*  CO2  --   --   --   --   --   --    < > 32 30  --  33* 31 29  GLUCOSE  --   --   --   --   --   --    < > 99 91  --  97 98  123*  BUN  --   --   --   --   --   --    < > 13 14  --  14 10 10  CREATININE  --   --   --   --   --   --    < > 0.68 0.67  --  0.65 0.66 0.56*  CALCIUM  --   --   --   --   --   --    < > 9.0 9.0  --  9.1 8.8* 9.0  AST 27  --   --   --   --   --    < > 24 21  --  21 17 21   ALT 29  --   --   --   --   --    < > 26 22  --  24 20 22   ALKPHOS 52  --   --   --   --   --    < > 46 43  --  45 38 46  BILITOT 0.9  --   --   --   --   --    < > 1.0 1.2  --  1.7* 1.2 0.6  ALBUMIN 3.9  --   --   --   --   --    < > 3.7 3.4*  --  3.4* 3.0* 3.1*  MG  --   --   --   --   --   --    < > 2.2 1.7  --  1.9 1.8 1.9  PROCALCITON  --   --   --   --   --   --   --   --   --   --   --  <0.10 <0.10  LATICACIDVEN 0.7  --   --   --   --   --   --   --   --   --   --   --   --   INR  --   --  1.0  --   --   --   --   --   --   --   --   --   --   TSH  --   --   --   --   --  3.067  --   --   --   --   --   --   --   AMMONIA  --   --   --  30  --   --   --   --   --   --   --   --   --   BNP  --    < >  --   --   --   --   --  291.1*  --  251.7* 208.9* 162.3* 448.8*   < > = values in this interval not displayed.    ------------------------------------------------------------------------------------------------------------------ No results for input(s): CHOL, HDL, LDLCALC, TRIG, CHOLHDL, LDLDIRECT in the last 72 hours.  No results found for: HGBA1C ------------------------------------------------------------------------------------------------------------------ No results for input(s): TSH, T4TOTAL, T3FREE, THYROIDAB in the last 72 hours.  Invalid input(s): FREET3   Cardiac Enzymes No results for input(s): CKMB, TROPONINI, MYOGLOBIN in the last 168 hours.  Invalid input(s): CK ------------------------------------------------------------------------------------------------------------------    Component Value Date/Time   BNP 448.8 (H) 02/25/2021 0218     Radiology Reports DG Chest 2 View  Result Date:  02/19/2021 CLINICAL DATA:  50 year old male with shortness of breath. EXAM: CHEST - 2 VIEW COMPARISON:  03/04/2014 FINDINGS: The mediastinal contours are within normal limits. Mild cardiomegaly.  Low lung volumes. My cephalization of pulmonary vasculature. Mild diffuse hazy pulmonary opacities with a bibasilar predominance. No pneumothorax or evidence of significant pleural effusion. No acute osseous abnormality. IMPRESSION: Mild pulmonary edema. Electronically Signed   By: Marliss Coots M.D.   On: 02/19/2021 13:59   DG ELBOW COMPLETE LEFT (3+VIEW)  Result Date: 02/24/2021 CLINICAL DATA:  LEFT posterior elbow pain EXAM: LEFT ELBOW - COMPLETE 3+ VIEW COMPARISON:  None. FINDINGS: No evidence of fracture of the ulna or humerus. The radial head is normal. No joint effusion. IMPRESSION: No fracture or dislocation. Electronically Signed   By: Genevive Bi M.D.   On: 02/24/2021 09:06   DG Forearm Left  Result Date: 02/24/2021 CLINICAL DATA:  Fall. EXAM: LEFT FOREARM - 2 VIEW COMPARISON:  Left elbow x-ray same day. FINDINGS: There is no acute fracture or dislocation. Joint spaces are maintained. Small enthesophyte is again noted from the anterior distal humerus. There is soft tissue swelling posterior to the elbow. IV is noted in the anterior forearm. IMPRESSION: 1. No acute bony abnormality. Electronically Signed   By: Darliss Cheney M.D.   On: 02/24/2021 16:13   CT Head Wo Contrast  Result Date: 02/19/2021 CLINICAL DATA:  Neuro deficit, acute, stroke suspected EXAM: CT HEAD WITHOUT CONTRAST TECHNIQUE: Contiguous axial images were obtained from the base of the skull through the vertex without intravenous contrast. COMPARISON:  None. FINDINGS: Brain: No evidence of acute infarction, hemorrhage, hydrocephalus, extra-axial collection or mass lesion/mass effect. Vascular: No hyperdense vessel or unexpected calcification. Skull: Normal. Negative for fracture or focal lesion. Sinuses/Orbits: Mild mucosal  thickening of the LEFT greater than RIGHT maxillary sinuses. Other: None. IMPRESSION: No acute intracranial abnormality. Electronically Signed   By: Meda Klinefelter M.D.   On: 02/19/2021 14:57   MR BRAIN WO CONTRAST  Result Date: 02/19/2021 CLINICAL DATA:  Neuro deficit, acute, stroke suspected. Generalized weakness and dizziness. EXAM: MRI HEAD WITHOUT CONTRAST TECHNIQUE: Multiplanar, multiecho pulse sequences of the brain and surrounding structures were obtained without intravenous contrast. COMPARISON:  Head CT same day FINDINGS: Brain: The brain has a normal appearance without evidence of malformation, atrophy, old or acute small or large vessel infarction, mass lesion, hemorrhage, hydrocephalus or extra-axial collection. Vascular: Major vessels at the base of the brain show flow. Venous sinuses appear patent. Skull and upper cervical spine: Normal. Sinuses/Orbits: Clear/normal. Other: None significant. IMPRESSION: Normal MRI of the brain. Electronically Signed   By: Paulina Fusi M.D.   On: 02/19/2021 18:23   MR THORACIC SPINE WO CONTRAST  Result Date: 02/19/2021 CLINICAL DATA:  Mid back pain.  Weakness and dizziness. EXAM: MRI THORACIC SPINE WITHOUT CONTRAST TECHNIQUE: Multiplanar, multisequence MR imaging of the thoracic spine was performed. No intravenous contrast was administered. COMPARISON:  None. FINDINGS: Alignment:  Normal Vertebrae: Normal Cord: Thoracic cord is normal. No focal lesion. No cord compression. Paraspinal and other soft tissues: Normal Disc levels: No evidence of disc degeneration, bulge or herniation. No facet arthropathy. Insignificant superior endplate Schmorl's node at T9 without edema. IMPRESSION: Normal MRI of the thoracic spine. No cord abnormality. No abnormality seen to explain regional pain. Electronically Signed   By: Paulina Fusi M.D.   On: 02/19/2021 18:25   MR LUMBAR SPINE WO CONTRAST  Result Date: 02/19/2021 CLINICAL DATA:  Low back pain. Cauda equina  syndrome. Generalized weakness. EXAM: MRI LUMBAR SPINE WITHOUT CONTRAST TECHNIQUE: Multiplanar, multisequence MR imaging of the lumbar spine was performed. No intravenous contrast was administered. COMPARISON:  None. FINDINGS: Segmentation:  5 lumbar type vertebral bodies assumed. Alignment:  Mild curvature convex to the left with the apex at L3. Vertebrae:  No fracture or focal bone lesion. Conus medullaris and cauda equina: Conus extends to the L1 level. Conus and cauda equina appear normal. Paraspinal and other soft tissues: Negative Disc levels: L1-2: Mild noncompressive disc bulge. L2-3: Mild noncompressive disc bulge. L3-4: Mild noncompressive disc bulge. L4-5: Mild bulging of the disc. Mild facet and ligamentous hypertrophy. Mild narrowing of the lateral recesses left more than right. Some potential the left L5 nerve could be affected in this location. L5-S1: Normal interspace. IMPRESSION: Mild non-compressive disc bulges at L1-2, L2-3 and L3-4. L4-5 disc bulge more prominent towards the left. Mild facet and ligamentous hypertrophy. Mild stenosis of the left lateral recess at this level that could possibly cause compression of the left L5 nerve. Electronically Signed   By: Paulina Fusi M.D.   On: 02/19/2021 18:27   DG Chest Port 1 View  Result Date: 02/24/2021 CLINICAL DATA:  Shortness of breath. EXAM: PORTABLE CHEST 1 VIEW COMPARISON:  02/21/2021 FINDINGS: 0732 hours. Low volume film. The cardio pericardial silhouette is enlarged. Interstitial markings are diffusely coarsened with chronic features. The lungs are clear without focal pneumonia, edema, pneumothorax or pleural effusion. The visualized bony structures of the thorax show no acute abnormality. Telemetry leads overlie the chest. IMPRESSION: Low volume film with chronic interstitial coarsening. Electronically Signed   By: Kennith Center M.D.   On: 02/24/2021 07:50   DG Chest Port 1 View  Result Date: 02/21/2021 CLINICAL DATA:  Shortness of  breath. EXAM: PORTABLE CHEST 1 VIEW COMPARISON:  February 19, 2021. FINDINGS: The heart size and mediastinal contours are within normal limits. Minimal bibasilar subsegmental atelectasis or possibly edema is noted which is slightly improved compared to prior exam. The visualized skeletal structures are unremarkable. IMPRESSION: Slightly improved bibasilar opacities as described above. Electronically Signed   By: Lupita Raider M.D.   On: 02/21/2021 08:59   ECHOCARDIOGRAM COMPLETE  Result Date: 02/20/2021    ECHOCARDIOGRAM REPORT   Patient Name:   VALENTINO SAAVEDRA Date of Exam: 02/20/2021 Medical Rec #:  [id number]      Height:       76.0 in Accession #:    [id number]     Weight:       335.3 lb Date of Birth:  1970-12-22      BSA:          2.759 m Patient Age:    50 years       BP:           153/130 mmHg Patient Gender: M              HR:           111 bpm. Exam Location:  Inpatient Procedure: 2D Echo Indications:    acute systolic chf  History:        Patient has no prior history of Echocardiogram examinations.                 Risk Factors:Current Smoker, Alcohol use and Hypertension.  Sonographer:    Delcie Roch RDCS Referring Phys: [id number] Deno Lunger SHALHOUB IMPRESSIONS  1. Left ventricular ejection fraction, by estimation, is 60 to 65%. The left ventricle has normal function. The left ventricle has no regional wall motion abnormalities. Left ventricular diastolic function could not be evaluated.  2. Right ventricular systolic function is normal. The right ventricular size is normal. Tricuspid  regurgitation signal is inadequate for assessing PA pressure.  3. The mitral valve is grossly normal. Trivial mitral valve regurgitation. No evidence of mitral stenosis.  4. The aortic valve was not well visualized. There is mild calcification of the aortic valve. Aortic valve regurgitation is not visualized. Mild aortic valve sclerosis is present, with no evidence of aortic valve stenosis. Comparison(s): No prior  Echocardiogram. Conclusion(s)/Recommendation(s): Normal biventricular function without evidence of hemodynamically significant valvular heart disease. FINDINGS  Left Ventricle: Left ventricular ejection fraction, by estimation, is 60 to 65%. The left ventricle has normal function. The left ventricle has no regional wall motion abnormalities. The left ventricular internal cavity size was normal in size. There is  no left ventricular hypertrophy. Left ventricular diastolic function could not be evaluated. Right Ventricle: The right ventricular size is normal. Right vetricular wall thickness was not well visualized. Right ventricular systolic function is normal. Tricuspid regurgitation signal is inadequate for assessing PA pressure. Left Atrium: Left atrial size was normal in size. Right Atrium: Right atrial size was normal in size. Pericardium: There is no evidence of pericardial effusion. Presence of pericardial fat pad. Mitral Valve: The mitral valve is grossly normal. Trivial mitral valve regurgitation. No evidence of mitral valve stenosis. Tricuspid Valve: The tricuspid valve is grossly normal. Tricuspid valve regurgitation is trivial. No evidence of tricuspid stenosis. Aortic Valve: The aortic valve was not well visualized. There is mild calcification of the aortic valve. Aortic valve regurgitation is not visualized. Mild aortic valve sclerosis is present, with no evidence of aortic valve stenosis. Pulmonic Valve: The pulmonic valve was not well visualized. Pulmonic valve regurgitation is not visualized. Aorta: The aortic root and ascending aorta are structurally normal, with no evidence of dilitation. Venous: The inferior vena cava was not well visualized. IAS/Shunts: The interatrial septum was not well visualized.  LEFT VENTRICLE PLAX 2D LVIDd:         6.00 cm LVIDs:         3.80 cm LV PW:         1.10 cm LV IVS:        1.10 cm LVOT diam:     2.80 cm LV SV:         101 LV SV Index:   37 LVOT Area:     6.16 cm   RIGHT VENTRICLE RV S prime:     15.80 cm/s TAPSE (M-mode): 2.4 cm LEFT ATRIUM           Index        RIGHT ATRIUM           Index LA diam:      4.40 cm 1.59 cm/m   RA Area:     19.00 cm LA Vol (A4C): 71.1 ml 25.77 ml/m  RA Volume:   51.10 ml  18.52 ml/m  AORTIC VALVE LVOT Vmax:   103.00 cm/s LVOT Vmean:  67.700 cm/s LVOT VTI:    0.164 m  AORTA Ao Root diam: 3.60 cm  SHUNTS Systemic VTI:  0.16 m Systemic Diam: 2.80 cm Jodelle Red MD Electronically signed by Jodelle Red MD Signature Date/Time: 02/20/2021/1:32:52 PM    Final    US Abdomen Limited RUQ (LIVER/GB)  Result Date: 02/19/2021 CLINICAL DATA:  Cirrhosis. EXAM: ULTRASOUND ABDOMEN LIMITED RIGHT UPPER QUADRANT COMPARISON:  None. FINDINGS: Gallbladder: Partially distended. No visualized gallstones. Diffuse gallbladder wall thickening at 6 mm. No sonographic Murphy sign noted by sonographer. Common bile duct: Diameter: 5 mm, normal. Liver: Heterogeneous, coarsened, and diffusely increased  in parenchymal echogenicity. The liver parenchyma is difficult to penetrate. Allowing for this, no focal lesion is seen. There may be a subtle capsular nodularity. Portal vein is patent on color Doppler imaging with normal direction of blood flow towards the liver. Other: No right upper quadrant ascites. IMPRESSION: 1. Heterogeneous, coarsened, and increased hepatic parenchymal echogenicity. There may be mild capsular nodularity. Findings consistent with steatosis and possible cirrhosis. 2. Liver parenchyma is difficult to penetrate, no focal lesion is seen. 3. Mild gallbladder wall thickening is nonspecific in the setting of chronic liver disease. No gallstones. No biliary dilatation. Electronically Signed   By: Narda Rutherford M.D.   On: 02/19/2021 21:05

## 2021-02-25 NOTE — Progress Notes (Signed)
     Subjective:  Patient reports that the pain in the left arm is improved today.  His IV was removed from his left forearm to his right arm.  He notes much less pain in the forearm and hand today.  He still notes some discomfort with elbow range of motion.  He denies any distal numbness and tingling.  Objective:   VITALS:   Vitals:   02/24/21 1956 02/24/21 2330 02/25/21 0332 02/25/21 0742  BP: (!) 154/81 133/69 134/90 131/88  Pulse: (!) 103 (!) 111 (!) 102 87  Resp: 20 18 20 19   Temp: 98.1 F (36.7 C) 98 F (36.7 C) 98 F (36.7 C) 98.1 F (36.7 C)  TempSrc: Oral Oral Oral Oral  SpO2: 93% 94% 95% 97%  Weight:   (!) 149.2 kg   Height:        Mild swelling and tenderness about the forearm and hand tolerates gentle active and passive range of motion of the fingers wrist and elbow.  Tolerates elbow range of motion from 0 to 120 degrees, as well as full supination pronation mild erythema over the posterior aspect of the elbow without fluctuance or notable swelling   Lab Results  Component Value Date   WBC 8.6 02/25/2021   HGB 14.0 02/25/2021   HCT 40.4 02/25/2021   MCV 101.3 (H) 02/25/2021   PLT 196 02/25/2021   BMET    Component Value Date/Time   NA 129 (L) 02/25/2021 0218   K 4.1 02/25/2021 0218   CL 93 (L) 02/25/2021 0218   CO2 29 02/25/2021 0218   GLUCOSE 123 (H) 02/25/2021 0218   BUN 10 02/25/2021 0218   CREATININE 0.56 (L) 02/25/2021 0218   CALCIUM 9.0 02/25/2021 0218   GFRNONAA >60 02/25/2021 0218   X-rays of the elbow and forearm demonstrate no fracture, dislocation, or other osseous abnormalities  Assessment/Plan:     Principal Problem:   Acute respiratory failure with hypoxia (HCC) Active Problems:   Alcohol withdrawal delirium, acute, hypoactive (HCC)   Hypertensive urgency   Nicotine dependence, cigarettes, uncomplicated   Fall at home, initial encounter   Elevated troponin level not due myocardial infarction  Left posterior elbow cellulitis  versus IV infiltration    Plan: The patient appears to be doing better today with discontinuation of the left forearm IV.  Low suspicion for septic arthritis would not recommend an aspiration at this time.  No elbow effusion or swelling about the elbow.  Swelling appears to be mostly in the forearm area in the location of the old IV.  Would continue conservative management at this time.  Please reach out if symptoms recur or worsen.   Shilo Pauwels A Mavery Milling 02/25/2021, 8:12 AM   02/27/2021, MD Cell [phone]  Contact information:   [phone] 7am-5pm epic message Dr. [email or web address], or call office for patient follow up: [phone] After hours and holidays please check [email or web address] for group call information for [facility]

## 2021-02-25 NOTE — Progress Notes (Signed)
Physical Therapy Treatment Patient Details Name: Colin Stevens MRN: [id number] DOB: 07-13-1970 Today's Date: 02/25/2021   History of Present Illness Pt is a 50 y.o. admitted 02/19/21 with SOB, falls at home. Workup for acute hypoxic respiratory failure secondary to acute on chronic CHF, HTN urgency, DTs. Brain MRI negative for acute abnormality; mild L5 L-side nerve compression. Pt with L elbow pain and redness; xray negative for fx; plan to treat empirically for gout and cellulitis. PMH includes HTN, substance abuse.   PT Comments    Pt progressing well with mobility; continues with flat affect, but clearly motivated to participate. Today's session focused on transfer training and standing tolerance in stedy frame; pt demonstrates improving strength and endurance, able to increase time standing and perform ADL tasks standing at sink. Pt remains limited by generalized weakness, decreased activity tolerance, poor balance strategies/postural reactions and impaired cognition; still with significant fear of falling, but demonstrates good carryover of strategies to help with this (I.e. pursed lip breathing). Pt remains an excellent candidate for intensive CIR-level therapies to maximize functional mobility and independence prior to return home. Pt's wife continues to be present and supportive.    Recommendations for follow up therapy are one component of a multi-disciplinary discharge planning process, led by the attending physician.  Recommendations may be updated based on patient status, additional functional criteria and insurance authorization.  Follow Up Recommendations  Acute inpatient rehab (3hours/day)     Assistance Recommended at Discharge Frequent or constant Supervision/Assistance  Equipment Recommendations  TBD - Wheelchair cushion (measurements PT);Wheelchair (measurements PT);3in1 (PT);Rolling walker (2 wheels)    Recommendations for Other Services Rehab consult     Precautions /  Restrictions Precautions Precautions: Fall;Other (comment) Precaution Comments: monitor O2, fearful of falling, painful joints w/ gout Restrictions Weight Bearing Restrictions: No     Mobility  Bed Mobility Overal bed mobility: Needs Assistance Bed Mobility: Supine to Sit     Supine to sit: Mod assist;HOB elevated Sit to supine: Mod assist   General bed mobility comments: Improved ability to initiate BLE movement to EOB, cues to use bed rail, modA for trunk elevation and scooting hips to EOB; modA for BLE management with return to supine    Transfers Overall transfer level: Needs assistance Equipment used: Ambulation equipment used Transfers: Sit to/from Stand Sit to Stand: Min assist;+2 physical assistance;+2 safety/equipment;From elevated surface           General transfer comment: Initial minA+2 to stand from slight elevated EOB into stedy frame, heavy reliance on BUE support to pull into standing; multiple sit<>stands from [place] seat with min guard+2; pt with full body shakiness, but improved ability to maintain prolonged standing Transfer via Lift Equipment: Stedy  Ambulation/Gait                 Stairs             Wheelchair Mobility    Modified Rankin (Stroke Patients Only)       Balance Overall balance assessment: Needs assistance Sitting-balance support: Bilateral upper extremity supported;Feet supported Sitting balance-Leahy Scale: Fair Sitting balance - Comments: Improved ability to maintain static sittign without UE support, appears more confident   Standing balance support: Bilateral upper extremity supported;Single extremity supported Standing balance-Leahy Scale: Poor Standing balance comment: Reliant on UE support or external assist to maintain standing balance in stedy frame; able to perform ADL task standing at sink in stedy with single UE support  Cognition Arousal/Alertness:  Awake/alert Behavior During Therapy: Anxious;Flat affect Overall Cognitive Status: Impaired/Different from baseline Area of Impairment: Orientation;Attention;Memory;Following commands;Safety/judgement;Awareness;Problem solving                 Orientation Level: Disoriented to;Place;Time Current Attention Level: Sustained Memory: Decreased short-term memory Following Commands: Follows one step commands with increased time Safety/Judgement: Decreased awareness of safety;Decreased awareness of deficits Awareness: Intellectual Problem Solving: Slow processing;Decreased initiation;Requires verbal cues;Requires tactile cues;Difficulty sequencing General Comments: Flat affect, Pleasant and follows directions with increased time. Limited by anxiety and fear of falling. able to demo improving ability to converse about topics he likes (music) though still some difficulty with word finding        Exercises      General Comments General comments (skin integrity, edema, etc.): SpO2 97% on 3L O2 [place]. Pt with improved awareness to perform pursed lip breathing when becoming anxious, intermittent cues for calming strategies; music played throughotu session. Pt's wife present and supportive      Pertinent Vitals/Pain Pain Assessment: Faces Faces Pain Scale: Hurts little more Pain Location: Bilateral hands/wrists, R elbow Pain Descriptors / Indicators: Grimacing;Guarding;Tightness Pain Intervention(s): Monitored during session;Limited activity within patient's tolerance    Home Living                          Prior Function            PT Goals (current goals can now be found in the care plan section) Progress towards PT goals: Progressing toward goals    Frequency    Min 3X/week      PT Plan Current plan remains appropriate    Co-evaluation              AM-PAC PT "6 Clicks" Mobility   Outcome Measure  Help needed turning from your back to your side while in a  flat bed without using bedrails?: A Lot Help needed moving from lying on your back to sitting on the side of a flat bed without using bedrails?: A Lot Help needed moving to and from a bed to a chair (including a wheelchair)?: A Lot Help needed standing up from a chair using your arms (e.g., wheelchair or bedside chair)?: A Lot Help needed to walk in hospital room?: Total Help needed climbing 3-5 steps with a railing? : Total 6 Click Score: 10    End of Session Equipment Utilized During Treatment: Gait belt Activity Tolerance: Patient tolerated treatment well Patient left: in bed;with call bell/phone within reach;with bed alarm set Nurse Communication: Mobility status;Need for lift equipment PT Visit Diagnosis: Other abnormalities of gait and mobility (R26.89);Muscle weakness (generalized) (M62.81);Other symptoms and signs involving the nervous system (R29.898)     Time: 9678-9381 PT Time Calculation (min) (ACUTE ONLY): 39 min  Charges:  $Therapeutic Exercise: 8-22 mins $Therapeutic Activity: 23-37 mins                     Ina Homes, PT, DPT Acute Rehabilitation Services  Pager [phone] Office [phone]  Malachy Chamber 02/25/2021, 5:44 PM

## 2021-02-26 LAB — CBC WITH DIFFERENTIAL/PLATELET
Abs Immature Granulocytes: 0.03 10*3/uL (ref 0.00–0.07)
Basophils Absolute: 0 10*3/uL (ref 0.0–0.1)
Basophils Relative: 0 %
Eosinophils Absolute: 0.1 10*3/uL (ref 0.0–0.5)
Eosinophils Relative: 1 %
HCT: 42.6 % (ref 39.0–52.0)
Hemoglobin: 14.4 g/dL (ref 13.0–17.0)
Immature Granulocytes: 0 %
Lymphocytes Relative: 3 %
Lymphs Abs: 0.2 10*3/uL — ABNORMAL LOW (ref 0.7–4.0)
MCH: 35 pg — ABNORMAL HIGH (ref 26.0–34.0)
MCHC: 33.8 g/dL (ref 30.0–36.0)
MCV: 103.4 fL — ABNORMAL HIGH (ref 80.0–100.0)
Monocytes Absolute: 0.2 10*3/uL (ref 0.1–1.0)
Monocytes Relative: 3 %
Neutro Abs: 7.9 10*3/uL — ABNORMAL HIGH (ref 1.7–7.7)
Neutrophils Relative %: 93 %
Platelets: 233 10*3/uL (ref 150–400)
RBC: 4.12 MIL/uL — ABNORMAL LOW (ref 4.22–5.81)
RDW: 12.1 % (ref 11.5–15.5)
WBC: 8.5 10*3/uL (ref 4.0–10.5)
nRBC: 0 % (ref 0.0–0.2)

## 2021-02-26 LAB — COMPREHENSIVE METABOLIC PANEL
ALT: 41 U/L (ref 0–44)
AST: 39 U/L (ref 15–41)
Albumin: 3.2 g/dL — ABNORMAL LOW (ref 3.5–5.0)
Alkaline Phosphatase: 48 U/L (ref 38–126)
Anion gap: 8 (ref 5–15)
BUN: 8 mg/dL (ref 6–20)
CO2: 29 mmol/L (ref 22–32)
Calcium: 9.4 mg/dL (ref 8.9–10.3)
Chloride: 99 mmol/L (ref 98–111)
Creatinine, Ser: 0.59 mg/dL — ABNORMAL LOW (ref 0.61–1.24)
GFR, Estimated: >60 mL/min (ref >60)
Glucose, Bld: 115 mg/dL — ABNORMAL HIGH (ref 70–99)
Potassium: 4.2 mmol/L (ref 3.5–5.1)
Sodium: 136 mmol/L (ref 135–145)
Total Bilirubin: 0.7 mg/dL (ref 0.3–1.2)
Total Protein: 6.7 g/dL (ref 6.5–8.1)

## 2021-02-26 LAB — BRAIN NATRIURETIC PEPTIDE: B Natriuretic Peptide: 335.3 pg/mL — ABNORMAL HIGH (ref 0.0–100.0)

## 2021-02-26 LAB — PROCALCITONIN: Procalcitonin: <0.1 ng/mL

## 2021-02-26 LAB — MAGNESIUM: Magnesium: 1.9 mg/dL (ref 1.7–2.4)

## 2021-02-26 MED ORDER — COLCHICINE 0.6 MG PO TABS
0.6000 mg | ORAL_TABLET | Freq: Two times a day (BID) | ORAL | Status: DC
  Administered 2021-02-26 – 2021-03-01 (×7): 0.6 mg via ORAL
  Filled 2021-02-26 (×7): qty 1

## 2021-02-26 MED ORDER — FUROSEMIDE 10 MG/ML IJ SOLN
40.0000 mg | Freq: Once | INTRAMUSCULAR | Status: AC
  Administered 2021-02-26: 40 mg via INTRAVENOUS
  Filled 2021-02-26: qty 4

## 2021-02-26 MED ORDER — METHYLPREDNISOLONE SODIUM SUCC 125 MG IJ SOLR
80.0000 mg | Freq: Two times a day (BID) | INTRAMUSCULAR | Status: DC
  Administered 2021-02-26 (×2): 80 mg via INTRAVENOUS
  Filled 2021-02-26 (×2): qty 2

## 2021-02-26 NOTE — Progress Notes (Signed)
     Subjective:  Patient reports continued improvement of the left arm pain today.  He notes less discomfort in the elbow, forearm, and hand.  He also has improved range of motion and less swelling.  Objective:   VITALS:   Vitals:   02/25/21 2019 02/25/21 2246 02/25/21 2329 02/26/21 0522  BP: (!) 142/84  (!) 148/88 (!) 147/85  Pulse: 94 100 (!) 104 100  Resp: 18 20 18 20   Temp: 97.9 F (36.6 C) 97.6 F (36.4 C) 97.7 F (36.5 C) 98.1 F (36.7 C)  TempSrc: Oral Oral Oral Oral  SpO2: 94% 97% 92% 95%  Weight:    (!) 143.7 kg  Height:        Minimal swelling of the left forearm, no swelling about the elbow.  Tolerates near full range of motion of the fingers wrist and elbow with only discomfort at end range of motion.  No significant erythema or cellulitis over the posterior elbow.  elbow without fluctuance or notable swelling   Lab Results  Component Value Date   WBC 8.5 02/26/2021   HGB 14.4 02/26/2021   HCT 42.6 02/26/2021   MCV 103.4 (H) 02/26/2021   PLT 233 02/26/2021   BMET    Component Value Date/Time   NA 136 02/26/2021 0110   K 4.2 02/26/2021 0110   CL 99 02/26/2021 0110   CO2 29 02/26/2021 0110   GLUCOSE 115 (H) 02/26/2021 0110   BUN 8 02/26/2021 0110   CREATININE 0.59 (L) 02/26/2021 0110   CALCIUM 9.4 02/26/2021 0110   GFRNONAA >60 02/26/2021 0110   X-rays of the elbow and forearm demonstrate no fracture, dislocation, or other osseous abnormalities  Assessment/Plan:     Principal Problem:   Acute respiratory failure with hypoxia (HCC) Active Problems:   Alcohol withdrawal delirium, acute, hypoactive (HCC)   Hypertensive urgency   Nicotine dependence, cigarettes, uncomplicated   Fall at home, initial encounter   Elevated troponin level not due myocardial infarction  Left posterior elbow cellulitis versus IV infiltration versus gout, now resolving    Plan: The patient appears to be doing better today with discontinuation of the left forearm IV  and continued gout treatment.  Low suspicion for septic arthritis would not recommend an aspiration at this time.  Orthopedics will sign off at this time.  Please call if symptoms recur.   Samayah Novinger A Bryer Gottsch 02/26/2021, 8:54 AM   02/28/2021, MD Cell [phone]  Contact information:   [phone] 7am-5pm epic message Dr. [id number], or call office for patient follow up: [phone] After hours and holidays please check [email or web address] for group call information for [facility]

## 2021-02-26 NOTE — Progress Notes (Signed)
PROGRESS NOTE                                                                                                                                                                                                             Patient Demographics:    Colin Stevens, is a 50 y.o. male, DOB - 1970/06/02, ZOX:[id number]  Outpatient Primary MD for the patient is Rankins, Fanny Dance, MD    LOS - 7  Admit date - 02/19/2021    Chief Complaint  Patient presents with   Shortness of Breath       Brief Narrative (HPI from H&P)  50 year old male with past medical history of hypertension, nicotine dependence, alcohol abuse who presents to [facility] emergency department via EMS with shortness of breath and DTs.   Subjective:   Patient in bed, appears comfortable, denies any headache, no fever, no chest pain or pressure, no shortness of breath , no abdominal pain. No new focal weakness. Mild L elbow , both feet and R wrist pain.   Assessment  & Plan :     Acute Hypoxic Resp. Failure due to Acute on Chronic CHF -echocardiogram with EF 60% likely had mild acute on chronic diastolic CHF which has resolved post IV Lasix, continue beta-blocker.  Monitor closely.   2.  DTS - Librium dose adjusted, continue with CIWA protocol, added Catapres, also has borderline B12 deficiency and placed on replacement, also on thiamine and folic acid , seen by neuro. MRI brain unremarkable. Gradually improving.  3.  Hypertensive urgency.  Treating DTs, medications adjusted for blood pressure.  Monitor.  4.  Fall at home.  MRI brain along with spine unremarkable.  Mild L5 left-sided nerve compression which will be monitored, PT OT >> CIR.  5. Ongoing nicotine dependence and alcohol abuse.  Counseled to quit.  6.  Obesity.  BMI of 40.  Follow with PCP for weight loss.  7.  Hypomagnesemia.  Replaced.  8.  Ultrasound suggestive of cirrhosis with  thrombocytopenia on ultrasound.  Likely alcoholic cirrhosis, thrombocytopenia likely due to it.  Negative acute hepatitis panel.  9.  Hyponatremia.  Initially CHF and has been diuresed, diuretics were stopped on 02/22/2021, subsequently no edema and somewhat dehydrated received a liter and a half of IV fluids on 02/23/2021, sodium has dropped further on 02/25/2019 was questioning  if he is developing SIADH, urine osmolality remains much higher than serum, serum uric acid is low normal, post challenge of low-dose Lasix, salt tablets >> add Samsca on 02/25/21 - much better.  10.  Left elbow redness and pain, both big toes and some in R. Wrist .  Most likely gout, according to the wife.  Started on 02/23/2021, Ortho on board, x-ray stable, patient has no recollection of any injury but previous history of gout, uric acid is low normal but could be due to consumption.  Empiric short course of antibiotic placed per Ortho- continue, improved after IV steroid continue , appreciate orthopedic input.  Not enough fluid for an aspiration at this time.      Condition -   Guarded  Family Communication  :  Wife bedside 02/20/21, called [phone] on 02/21/21, 02/23/21, 02/24/21 - bedside, 02/26/21  Code Status :  Full  Consults  :  Neuro, Ortho  PUD Prophylaxis : PPI   Procedures  :     TTE - 1. Left ventricular ejection fraction, by estimation, is 60 to 65%. The left ventricle has normal function. The left ventricle has no regional wall motion abnormalities. Left ventricular diastolic function could not be evaluated.  2. Right ventricular systolic function is normal. The right ventricular size is normal. Tricuspid regurgitation signal is inadequate for assessing PA pressure.  3. The mitral valve is grossly normal. Trivial mitral valve regurgitation. No evidence of mitral stenosis.  4. The aortic valve was not well visualized. There is mild calcification of the aortic valve. Aortic valve regurgitation is not  visualized. Mild aortic valve sclerosis is present, with no evidence of aortic valve stenosis.  RUQ [place] - Cirrhosis  MRI Brain and Spine - non acute MRI brain, MRI spine showing similar L-spine disease with possible mild L5 nerve compression on the left side.       Disposition Plan  :    Status is: Inpatient  Remains inpatient appropriate because: Severe DTs   DVT Prophylaxis  :  Lovenox   Lab Results  Component Value Date   PLT 233 02/26/2021    Diet :  Diet Order             DIET SOFT Room service appropriate? Yes; Fluid consistency: Thin  Diet effective now                    Inpatient Medications  Scheduled Meds:  cephALEXin  500 mg Oral Q6H   chlordiazePOXIDE  15 mg Oral TID   cloNIDine  0.1 mg Oral BID   colchicine  0.6 mg Oral BID   cyanocobalamin  1,000 mcg Subcutaneous Daily   enoxaparin (LOVENOX) injection  75 mg Subcutaneous Q24H   folic acid  1 mg Oral Daily   furosemide  40 mg Intravenous Once   lisinopril  5 mg Oral Daily   methylPREDNISolone (SOLU-MEDROL) injection  80 mg Intravenous Q12H   metoprolol tartrate  100 mg Oral BID   multivitamin with minerals  1 tablet Oral Daily   nicotine  21 mg Transdermal Daily   nystatin  5 mL Mouth/Throat QID   pantoprazole  40 mg Oral Daily   thiamine  100 mg Oral Daily   [START ON 02/28/2021] vitamin B-12  1,000 mcg Oral Daily   Continuous Infusions:   PRN Meds:.acetaminophen **OR** acetaminophen, albuterol, diclofenac Sodium, loperamide, LORazepam **OR** LORazepam, [DISCONTINUED] ondansetron **OR** ondansetron (ZOFRAN) IV, polyethylene glycol  Antibiotics  :    Anti-infectives (From  admission, onward)    Start     Dose/Rate Route Frequency Ordered Stop   02/25/21 1800  cephALEXin (KEFLEX) capsule 500 mg        500 mg Oral Every 6 hours 02/25/21 1403 02/28/21 2359   02/24/21 1100  ceFAZolin (ANCEF) IVPB 1 g/50 mL premix  Status:  Discontinued        1 g 100 mL/hr over 30 Minutes Intravenous  Every 8 hours 02/24/21 0932 02/25/21 1403        Time Spent in minutes  30   Susa Raring M.D on 02/26/2021 at 9:55 AM  To page go to [email or web address]   [facility] -  Office  [phone]  See all Orders from today for further details    Objective:   Vitals:   02/25/21 2246 02/25/21 2329 02/26/21 0522 02/26/21 0916  BP:  (!) 148/88 (!) 147/85 (!) 143/92  Pulse: 100 (!) 104 100 100  Resp: 20 18 20 18   Temp: 97.6 F (36.4 C) 97.7 F (36.5 C) 98.1 F (36.7 C) 98.1 F (36.7 C)  TempSrc: Oral Oral Oral Oral  SpO2: 97% 92% 95% 95%  Weight:   (!) 143.7 kg   Height:        Wt Readings from Last 3 Encounters:  02/26/21 (!) 143.7 kg     Intake/Output Summary (Last 24 hours) at 02/26/2021 0955 Last data filed at 02/25/2021 2131 Gross per 24 hour  Intake 600 ml  Output 3450 ml  Net -2850 ml     Physical Exam  Awake Alert x2, No new F.N deficits, Mild DTs, L.Elbow is red and tender, mild effusion if any - improved [place].AT,PERRAL Supple Neck, No JVD,   Symmetrical Chest wall movement, Good air movement bilaterally, CTAB RRR,No Gallops, Rubs or new Murmurs,  +ve B.Sounds, Abd Soft, No tenderness,   No Cyanosis, no edema    Data Review:    CBC Recent Labs  Lab 02/22/21 0214 02/23/21 0158 02/24/21 0153 02/25/21 0218 02/26/21 0110  WBC 10.2 10.1 10.2 8.6 8.5  HGB 14.5 14.3 13.4 14.0 14.4  HCT 44.1 43.2 39.0 40.4 42.6  PLT 137* 151 162 196 233  MCV 107.6* 103.6* 102.6* 101.3* 103.4*  MCH 35.4* 34.3* 35.3* 35.1* 35.0*  MCHC 32.9 33.1 34.4 34.7 33.8  RDW 12.6 12.3 12.1 11.9 12.1  LYMPHSABS 0.7 0.6* 0.6* 0.3* 0.2*  MONOABS 0.8 0.9 1.0 1.0 0.2  EOSABS 0.1 0.1 0.2 0.0 0.1  BASOSABS 0.1 0.0 0.1 0.0 0.0    Recent Labs  Lab 02/19/21 1327 02/19/21 1444 02/19/21 1951 02/19/21 2100 02/19/21 2201 02/19/21 2259 02/20/21 0110 02/22/21 0214 02/22/21 0529 02/23/21 0158 02/24/21 0153 02/25/21 0218 02/26/21 0110  NA  --   --   --   --    < >  --    <  > 132*  --  130* 128* 129* 136  K  --   --   --   --    < >  --    < > 4.2  --  4.1 4.1 4.1 4.2  CL  --   --   --   --   --   --    < > 92*  --  89* 91* 93* 99  CO2  --   --   --   --   --   --    < > 30  --  33* 31 29 29   GLUCOSE  --   --   --   --   --   --    < >  91  --  97 98 123* 115*  BUN  --   --   --   --   --   --    < > 14  --  14 10 10 8   CREATININE  --   --   --   --   --   --    < > 0.67  --  0.65 0.66 0.56* 0.59*  CALCIUM  --   --   --   --   --   --    < > 9.0  --  9.1 8.8* 9.0 9.4  AST 27  --   --   --   --   --    < > 21  --  21 17 21  39  ALT 29  --   --   --   --   --    < > 22  --  24 20 22  41  ALKPHOS 52  --   --   --   --   --    < > 43  --  45 38 46 48  BILITOT 0.9  --   --   --   --   --    < > 1.2  --  1.7* 1.2 0.6 0.7  ALBUMIN 3.9  --   --   --   --   --    < > 3.4*  --  3.4* 3.0* 3.1* 3.2*  MG  --   --   --   --   --   --    < > 1.7  --  1.9 1.8 1.9 1.9  PROCALCITON  --   --   --   --   --   --   --   --   --   --  <0.10 <0.10 <0.10  LATICACIDVEN 0.7  --   --   --   --   --   --   --   --   --   --   --   --   INR  --   --  1.0  --   --   --   --   --   --   --   --   --   --   TSH  --   --   --   --   --  3.067  --   --   --   --   --   --   --   AMMONIA  --   --   --  30  --   --   --   --   --   --   --   --   --   BNP  --    < >  --   --   --   --    < >  --  251.7* 208.9* 162.3* 448.8* 335.3*   < > = values in this interval not displayed.    ------------------------------------------------------------------------------------------------------------------ No results for input(s): CHOL, HDL, LDLCALC, TRIG, CHOLHDL, LDLDIRECT in the last 72 hours.  No results found for: HGBA1C ------------------------------------------------------------------------------------------------------------------ No results for input(s): TSH, T4TOTAL, T3FREE, THYROIDAB in the last 72 hours.  Invalid input(s): FREET3   Cardiac Enzymes No results for input(s): CKMB, TROPONINI,  MYOGLOBIN in the last 168 hours.  Invalid input(s): CK ------------------------------------------------------------------------------------------------------------------    Component Value Date/Time   BNP 335.3 (H) 02/26/2021 0110  Radiology Reports DG Chest 2 View  Result Date: 02/19/2021 CLINICAL DATA:  50 year old male with shortness of breath. EXAM: CHEST - 2 VIEW COMPARISON:  03/04/2014 FINDINGS: The mediastinal contours are within normal limits. Mild cardiomegaly. Low lung volumes. My cephalization of pulmonary vasculature. Mild diffuse hazy pulmonary opacities with a bibasilar predominance. No pneumothorax or evidence of significant pleural effusion. No acute osseous abnormality. IMPRESSION: Mild pulmonary edema. Electronically Signed   By: Marliss Coots M.D.   On: 02/19/2021 13:59   DG ELBOW COMPLETE LEFT (3+VIEW)  Result Date: 02/24/2021 CLINICAL DATA:  LEFT posterior elbow pain EXAM: LEFT ELBOW - COMPLETE 3+ VIEW COMPARISON:  None. FINDINGS: No evidence of fracture of the ulna or humerus. The radial head is normal. No joint effusion. IMPRESSION: No fracture or dislocation. Electronically Signed   By: Genevive Bi M.D.   On: 02/24/2021 09:06   DG Forearm Left  Result Date: 02/24/2021 CLINICAL DATA:  Fall. EXAM: LEFT FOREARM - 2 VIEW COMPARISON:  Left elbow x-ray same day. FINDINGS: There is no acute fracture or dislocation. Joint spaces are maintained. Small enthesophyte is again noted from the anterior distal humerus. There is soft tissue swelling posterior to the elbow. IV is noted in the anterior forearm. IMPRESSION: 1. No acute bony abnormality. Electronically Signed   By: Darliss Cheney M.D.   On: 02/24/2021 16:13   CT Head Wo Contrast  Result Date: 02/19/2021 CLINICAL DATA:  Neuro deficit, acute, stroke suspected EXAM: CT HEAD WITHOUT CONTRAST TECHNIQUE: Contiguous axial images were obtained from the base of the skull through the vertex without intravenous contrast.  COMPARISON:  None. FINDINGS: Brain: No evidence of acute infarction, hemorrhage, hydrocephalus, extra-axial collection or mass lesion/mass effect. Vascular: No hyperdense vessel or unexpected calcification. Skull: Normal. Negative for fracture or focal lesion. Sinuses/Orbits: Mild mucosal thickening of the LEFT greater than RIGHT maxillary sinuses. Other: None. IMPRESSION: No acute intracranial abnormality. Electronically Signed   By: Meda Klinefelter M.D.   On: 02/19/2021 14:57   MR BRAIN WO CONTRAST  Result Date: 02/19/2021 CLINICAL DATA:  Neuro deficit, acute, stroke suspected. Generalized weakness and dizziness. EXAM: MRI HEAD WITHOUT CONTRAST TECHNIQUE: Multiplanar, multiecho pulse sequences of the brain and surrounding structures were obtained without intravenous contrast. COMPARISON:  Head CT same day FINDINGS: Brain: The brain has a normal appearance without evidence of malformation, atrophy, old or acute small or large vessel infarction, mass lesion, hemorrhage, hydrocephalus or extra-axial collection. Vascular: Major vessels at the base of the brain show flow. Venous sinuses appear patent. Skull and upper cervical spine: Normal. Sinuses/Orbits: Clear/normal. Other: None significant. IMPRESSION: Normal MRI of the brain. Electronically Signed   By: Paulina Fusi M.D.   On: 02/19/2021 18:23   MR THORACIC SPINE WO CONTRAST  Result Date: 02/19/2021 CLINICAL DATA:  Mid back pain.  Weakness and dizziness. EXAM: MRI THORACIC SPINE WITHOUT CONTRAST TECHNIQUE: Multiplanar, multisequence MR imaging of the thoracic spine was performed. No intravenous contrast was administered. COMPARISON:  None. FINDINGS: Alignment:  Normal Vertebrae: Normal Cord: Thoracic cord is normal. No focal lesion. No cord compression. Paraspinal and other soft tissues: Normal Disc levels: No evidence of disc degeneration, bulge or herniation. No facet arthropathy. Insignificant superior endplate Schmorl's node at T9 without edema.  IMPRESSION: Normal MRI of the thoracic spine. No cord abnormality. No abnormality seen to explain regional pain. Electronically Signed   By: Paulina Fusi M.D.   On: 02/19/2021 18:25   MR LUMBAR SPINE WO CONTRAST  Result Date: 02/19/2021 CLINICAL DATA:  Low back pain. Cauda equina syndrome. Generalized weakness. EXAM: MRI LUMBAR SPINE WITHOUT CONTRAST TECHNIQUE: Multiplanar, multisequence MR imaging of the lumbar spine was performed. No intravenous contrast was administered. COMPARISON:  None. FINDINGS: Segmentation:  5 lumbar type vertebral bodies assumed. Alignment:  Mild curvature convex to the left with the apex at L3. Vertebrae:  No fracture or focal bone lesion. Conus medullaris and cauda equina: Conus extends to the L1 level. Conus and cauda equina appear normal. Paraspinal and other soft tissues: Negative Disc levels: L1-2: Mild noncompressive disc bulge. L2-3: Mild noncompressive disc bulge. L3-4: Mild noncompressive disc bulge. L4-5: Mild bulging of the disc. Mild facet and ligamentous hypertrophy. Mild narrowing of the lateral recesses left more than right. Some potential the left L5 nerve could be affected in this location. L5-S1: Normal interspace. IMPRESSION: Mild non-compressive disc bulges at L1-2, L2-3 and L3-4. L4-5 disc bulge more prominent towards the left. Mild facet and ligamentous hypertrophy. Mild stenosis of the left lateral recess at this level that could possibly cause compression of the left L5 nerve. Electronically Signed   By: Paulina Fusi M.D.   On: 02/19/2021 18:27   DG Chest Port 1 View  Result Date: 02/24/2021 CLINICAL DATA:  Shortness of breath. EXAM: PORTABLE CHEST 1 VIEW COMPARISON:  02/21/2021 FINDINGS: 0732 hours. Low volume film. The cardio pericardial silhouette is enlarged. Interstitial markings are diffusely coarsened with chronic features. The lungs are clear without focal pneumonia, edema, pneumothorax or pleural effusion. The visualized bony structures of the  thorax show no acute abnormality. Telemetry leads overlie the chest. IMPRESSION: Low volume film with chronic interstitial coarsening. Electronically Signed   By: Kennith Center M.D.   On: 02/24/2021 07:50   DG Chest Port 1 View  Result Date: 02/21/2021 CLINICAL DATA:  Shortness of breath. EXAM: PORTABLE CHEST 1 VIEW COMPARISON:  February 19, 2021. FINDINGS: The heart size and mediastinal contours are within normal limits. Minimal bibasilar subsegmental atelectasis or possibly edema is noted which is slightly improved compared to prior exam. The visualized skeletal structures are unremarkable. IMPRESSION: Slightly improved bibasilar opacities as described above. Electronically Signed   By: Lupita Raider M.D.   On: 02/21/2021 08:59   ECHOCARDIOGRAM COMPLETE  Result Date: 02/20/2021    ECHOCARDIOGRAM REPORT   Patient Name:   Colin Stevens Date of Exam: 02/20/2021 Medical Rec #:  [id number]      Height:       76.0 in Accession #:    [id number]     Weight:       335.3 lb Date of Birth:  October 04, 1970      BSA:          2.759 m Patient Age:    50 years       BP:           153/130 mmHg Patient Gender: M              HR:           111 bpm. Exam Location:  Inpatient Procedure: 2D Echo Indications:    acute systolic chf  History:        Patient has no prior history of Echocardiogram examinations.                 Risk Factors:Current Smoker, Alcohol use and Hypertension.  Sonographer:    Delcie Roch RDCS Referring Phys: [id number] Deno Lunger [place] IMPRESSIONS  1. Left ventricular ejection fraction, by estimation, is 60 to 65%. The left  ventricle has normal function. The left ventricle has no regional wall motion abnormalities. Left ventricular diastolic function could not be evaluated.  2. Right ventricular systolic function is normal. The right ventricular size is normal. Tricuspid regurgitation signal is inadequate for assessing PA pressure.  3. The mitral valve is grossly normal. Trivial mitral valve  regurgitation. No evidence of mitral stenosis.  4. The aortic valve was not well visualized. There is mild calcification of the aortic valve. Aortic valve regurgitation is not visualized. Mild aortic valve sclerosis is present, with no evidence of aortic valve stenosis. Comparison(s): No prior Echocardiogram. Conclusion(s)/Recommendation(s): Normal biventricular function without evidence of hemodynamically significant valvular heart disease. FINDINGS  Left Ventricle: Left ventricular ejection fraction, by estimation, is 60 to 65%. The left ventricle has normal function. The left ventricle has no regional wall motion abnormalities. The left ventricular internal cavity size was normal in size. There is  no left ventricular hypertrophy. Left ventricular diastolic function could not be evaluated. Right Ventricle: The right ventricular size is normal. Right vetricular wall thickness was not well visualized. Right ventricular systolic function is normal. Tricuspid regurgitation signal is inadequate for assessing PA pressure. Left Atrium: Left atrial size was normal in size. Right Atrium: Right atrial size was normal in size. Pericardium: There is no evidence of pericardial effusion. Presence of pericardial fat pad. Mitral Valve: The mitral valve is grossly normal. Trivial mitral valve regurgitation. No evidence of mitral valve stenosis. Tricuspid Valve: The tricuspid valve is grossly normal. Tricuspid valve regurgitation is trivial. No evidence of tricuspid stenosis. Aortic Valve: The aortic valve was not well visualized. There is mild calcification of the aortic valve. Aortic valve regurgitation is not visualized. Mild aortic valve sclerosis is present, with no evidence of aortic valve stenosis. Pulmonic Valve: The pulmonic valve was not well visualized. Pulmonic valve regurgitation is not visualized. Aorta: The aortic root and ascending aorta are structurally normal, with no evidence of dilitation. Venous: The inferior  vena cava was not well visualized. IAS/Shunts: The interatrial septum was not well visualized.  LEFT VENTRICLE PLAX 2D LVIDd:         6.00 cm LVIDs:         3.80 cm LV PW:         1.10 cm LV IVS:        1.10 cm LVOT diam:     2.80 cm LV SV:         101 LV SV Index:   37 LVOT Area:     6.16 cm  RIGHT VENTRICLE RV S prime:     15.80 cm/s TAPSE (M-mode): 2.4 cm LEFT ATRIUM           Index        RIGHT ATRIUM           Index LA diam:      4.40 cm 1.59 cm/m   RA Area:     19.00 cm LA Vol (A4C): 71.1 ml 25.77 ml/m  RA Volume:   51.10 ml  18.52 ml/m  AORTIC VALVE LVOT Vmax:   103.00 cm/s LVOT Vmean:  67.700 cm/s LVOT VTI:    0.164 m  AORTA Ao Root diam: 3.60 cm  SHUNTS Systemic VTI:  0.16 m Systemic Diam: 2.80 cm Jodelle Red MD Electronically signed by Jodelle Red MD Signature Date/Time: 02/20/2021/1:32:52 PM    Final    US Abdomen Limited RUQ (LIVER/GB)  Result Date: 02/19/2021 CLINICAL DATA:  Cirrhosis. EXAM: ULTRASOUND ABDOMEN LIMITED RIGHT UPPER QUADRANT COMPARISON:  None. FINDINGS: Gallbladder: Partially distended. No visualized gallstones. Diffuse gallbladder wall thickening at 6 mm. No sonographic Murphy sign noted by sonographer. Common bile duct: Diameter: 5 mm, normal. Liver: Heterogeneous, coarsened, and diffusely increased in parenchymal echogenicity. The liver parenchyma is difficult to penetrate. Allowing for this, no focal lesion is seen. There may be a subtle capsular nodularity. Portal vein is patent on color Doppler imaging with normal direction of blood flow towards the liver. Other: No right upper quadrant ascites. IMPRESSION: 1. Heterogeneous, coarsened, and increased hepatic parenchymal echogenicity. There may be mild capsular nodularity. Findings consistent with steatosis and possible cirrhosis. 2. Liver parenchyma is difficult to penetrate, no focal lesion is seen. 3. Mild gallbladder wall thickening is nonspecific in the setting of chronic liver disease. No gallstones.  No biliary dilatation. Electronically Signed   By: Narda Rutherford M.D.   On: 02/19/2021 21:05

## 2021-02-27 ENCOUNTER — Inpatient Hospital Stay (HOSPITAL_COMMUNITY): Payer: Self-pay

## 2021-02-27 LAB — CBC WITH DIFFERENTIAL/PLATELET
Abs Immature Granulocytes: 0.04 10*3/uL (ref 0.00–0.07)
Basophils Absolute: 0 10*3/uL (ref 0.0–0.1)
Basophils Relative: 0 %
Eosinophils Absolute: 0 10*3/uL (ref 0.0–0.5)
Eosinophils Relative: 0 %
HCT: 41.6 % (ref 39.0–52.0)
Hemoglobin: 14.2 g/dL (ref 13.0–17.0)
Immature Granulocytes: 1 %
Lymphocytes Relative: 5 %
Lymphs Abs: 0.4 10*3/uL — ABNORMAL LOW (ref 0.7–4.0)
MCH: 35.1 pg — ABNORMAL HIGH (ref 26.0–34.0)
MCHC: 34.1 g/dL (ref 30.0–36.0)
MCV: 102.7 fL — ABNORMAL HIGH (ref 80.0–100.0)
Monocytes Absolute: 0.6 10*3/uL (ref 0.1–1.0)
Monocytes Relative: 7 %
Neutro Abs: 7.2 10*3/uL (ref 1.7–7.7)
Neutrophils Relative %: 87 %
Platelets: 248 10*3/uL (ref 150–400)
RBC: 4.05 MIL/uL — ABNORMAL LOW (ref 4.22–5.81)
RDW: 12.1 % (ref 11.5–15.5)
WBC: 8.2 10*3/uL (ref 4.0–10.5)
nRBC: 0 % (ref 0.0–0.2)

## 2021-02-27 LAB — COMPREHENSIVE METABOLIC PANEL
ALT: 54 U/L — ABNORMAL HIGH (ref 0–44)
AST: 40 U/L (ref 15–41)
Albumin: 3.1 g/dL — ABNORMAL LOW (ref 3.5–5.0)
Alkaline Phosphatase: 46 U/L (ref 38–126)
Anion gap: 6 (ref 5–15)
BUN: 14 mg/dL (ref 6–20)
CO2: 33 mmol/L — ABNORMAL HIGH (ref 22–32)
Calcium: 9.7 mg/dL (ref 8.9–10.3)
Chloride: 95 mmol/L — ABNORMAL LOW (ref 98–111)
Creatinine, Ser: 0.56 mg/dL — ABNORMAL LOW (ref 0.61–1.24)
GFR, Estimated: >60 mL/min (ref >60)
Glucose, Bld: 125 mg/dL — ABNORMAL HIGH (ref 70–99)
Potassium: 4.3 mmol/L (ref 3.5–5.1)
Sodium: 134 mmol/L — ABNORMAL LOW (ref 135–145)
Total Bilirubin: 0.7 mg/dL (ref 0.3–1.2)
Total Protein: 6.5 g/dL (ref 6.5–8.1)

## 2021-02-27 LAB — MAGNESIUM: Magnesium: 2 mg/dL (ref 1.7–2.4)

## 2021-02-27 LAB — BRAIN NATRIURETIC PEPTIDE: B Natriuretic Peptide: 622.6 pg/mL — ABNORMAL HIGH (ref 0.0–100.0)

## 2021-02-27 MED ORDER — CLONIDINE HCL 0.2 MG PO TABS: 0.2000 mg | ORAL_TABLET | Freq: Two times a day (BID) | ORAL | Status: DC

## 2021-02-27 MED ORDER — CLONIDINE HCL 0.1 MG PO TABS
0.1000 mg | ORAL_TABLET | Freq: Two times a day (BID) | ORAL | Status: DC
  Administered 2021-02-27 – 2021-02-28 (×3): 0.1 mg via ORAL
  Filled 2021-02-27 (×3): qty 1

## 2021-02-27 MED ORDER — CHLORDIAZEPOXIDE HCL 5 MG PO CAPS
10.0000 mg | ORAL_CAPSULE | Freq: Three times a day (TID) | ORAL | Status: DC
  Administered 2021-02-27 – 2021-02-28 (×4): 10 mg via ORAL
  Filled 2021-02-27 (×3): qty 2

## 2021-02-27 MED ORDER — METHYLPREDNISOLONE SODIUM SUCC 40 MG IJ SOLR
40.0000 mg | Freq: Two times a day (BID) | INTRAMUSCULAR | Status: DC
  Administered 2021-02-27 (×2): 40 mg via INTRAVENOUS
  Filled 2021-02-27 (×2): qty 1

## 2021-02-27 MED ORDER — CARVEDILOL 12.5 MG PO TABS
12.5000 mg | ORAL_TABLET | Freq: Two times a day (BID) | ORAL | Status: DC
  Administered 2021-02-27 – 2021-03-01 (×5): 12.5 mg via ORAL
  Filled 2021-02-27 (×5): qty 1

## 2021-02-27 MED ORDER — FUROSEMIDE 40 MG PO TABS
40.0000 mg | ORAL_TABLET | Freq: Once | ORAL | Status: AC
  Administered 2021-02-27: 40 mg via ORAL
  Filled 2021-02-27: qty 1

## 2021-02-27 NOTE — Progress Notes (Signed)
PROGRESS NOTE                                                                                                                                                                                                             Patient Demographics:    Colin Stevens, is a 50 y.o. male, DOB - 07-02-1970, GMW:[id number]  Outpatient Primary MD for the patient is Rankins, Fanny Dance, MD    LOS - 8  Admit date - 02/19/2021    Chief Complaint  Patient presents with   Shortness of Breath       Brief Narrative (HPI from H&P)  50 year old male with past medical history of hypertension, nicotine dependence, alcohol abuse who presents to [facility] emergency department via EMS with shortness of breath and DTs.   Subjective:   Patient in bed, appears comfortable, denies any headache, no fever, no chest pain or pressure, no shortness of breath , no abdominal pain. No new focal weakness.  Much improved left elbow, right wrist and bilateral foot pain.   Assessment  & Plan :     Acute Hypoxic Resp. Failure due to Acute on Chronic CHF -echocardiogram with EF 60% likely had mild acute on chronic diastolic CHF which has resolved post IV Lasix, continue beta-blocker.  Monitor closely.   2.  DTS - Librium dose adjusted, continue with CIWA protocol, added Catapres, also has borderline B12 deficiency and placed on replacement, also on thiamine and folic acid , seen by neuro. MRI brain unremarkable. Gradually improving.  3.  Hypertensive urgency.  Treating DTs, medications adjusted for blood pressure.  Monitor.  4.  Fall at home.  MRI brain along with spine unremarkable.  Mild L5 left-sided nerve compression which will be monitored, PT OT >> CIR.  5. Ongoing nicotine dependence and alcohol abuse.  Counseled to quit.  6.  Obesity.  BMI of 40.  Follow with PCP for weight loss.  7.  Hypomagnesemia.  Replaced.  8.  Ultrasound suggestive of  cirrhosis with thrombocytopenia on ultrasound.  Likely alcoholic cirrhosis, thrombocytopenia likely due to it.  Negative acute hepatitis panel.  9.  Hyponatremia.  Initially CHF and has been diuresed, diuretics were stopped on 02/22/2021, subsequently no edema and somewhat dehydrated received a liter and a half of IV fluids on 02/23/2021, sodium has dropped further on 02/25/2019 was  questioning if he is developing SIADH, urine osmolality remains much higher than serum, serum uric acid is low normal, post challenge of low-dose Lasix, salt tablets >> added Samsca on 02/25/21 - much better.  10.  Left elbow redness and pain, both big toes and some in R. Wrist .  Most likely gout, according to the wife.  Started on 02/23/2021, Ortho on board, x-ray stable, patient has no recollection of any injury but previous history of gout, uric acid is low normal but could be due to consumption.  Empiric short course of antibiotic placed per Ortho - continue x 5 days, improved after IV steroid continue , appreciate orthopedic input.  Not enough fluid for an aspiration at this time.      Condition -   Guarded  Family Communication  :  Wife bedside 02/20/21, called [phone] on 02/21/21, 02/23/21, 02/24/21 - bedside, 02/26/21  Code Status :  Full  Consults  :  Neuro, Ortho  PUD Prophylaxis : PPI   Procedures  :     TTE - 1. Left ventricular ejection fraction, by estimation, is 60 to 65%. The left ventricle has normal function. The left ventricle has no regional wall motion abnormalities. Left ventricular diastolic function could not be evaluated.  2. Right ventricular systolic function is normal. The right ventricular size is normal. Tricuspid regurgitation signal is inadequate for assessing PA pressure.  3. The mitral valve is grossly normal. Trivial mitral valve regurgitation. No evidence of mitral stenosis.  4. The aortic valve was not well visualized. There is mild calcification of the aortic valve. Aortic  valve regurgitation is not visualized. Mild aortic valve sclerosis is present, with no evidence of aortic valve stenosis.  RUQ [place] - Cirrhosis  MRI Brain and Spine - non acute MRI brain, MRI spine showing similar L-spine disease with possible mild L5 nerve compression on the left side.       Disposition Plan  :    Status is: Inpatient  Remains inpatient appropriate because: Severe DTs   DVT Prophylaxis  :  Lovenox   Lab Results  Component Value Date   PLT 248 02/27/2021    Diet :  Diet Order             DIET SOFT Room service appropriate? Yes; Fluid consistency: Thin  Diet effective now                    Inpatient Medications  Scheduled Meds:  carvedilol  12.5 mg Oral BID WC   cephALEXin  500 mg Oral Q6H   chlordiazePOXIDE  10 mg Oral TID   cloNIDine  0.1 mg Oral BID   colchicine  0.6 mg Oral BID   enoxaparin (LOVENOX) injection  75 mg Subcutaneous Q24H   folic acid  1 mg Oral Daily   furosemide  40 mg Oral Once   lisinopril  5 mg Oral Daily   methylPREDNISolone (SOLU-MEDROL) injection  40 mg Intravenous Q12H   multivitamin with minerals  1 tablet Oral Daily   nicotine  21 mg Transdermal Daily   nystatin  5 mL Mouth/Throat QID   pantoprazole  40 mg Oral Daily   thiamine  100 mg Oral Daily   [START ON 02/28/2021] vitamin B-12  1,000 mcg Oral Daily   Continuous Infusions:   PRN Meds:.acetaminophen **OR** acetaminophen, albuterol, diclofenac Sodium, loperamide, [DISCONTINUED] ondansetron **OR** ondansetron (ZOFRAN) IV, polyethylene glycol  Antibiotics  :    Anti-infectives (From admission, onward)    Start  Dose/Rate Route Frequency Ordered Stop   02/25/21 1800  cephALEXin (KEFLEX) capsule 500 mg        500 mg Oral Every 6 hours 02/25/21 1403 02/28/21 2359   02/24/21 1100  ceFAZolin (ANCEF) IVPB 1 g/50 mL premix  Status:  Discontinued        1 g 100 mL/hr over 30 Minutes Intravenous Every 8 hours 02/24/21 0932 02/25/21 1403        Time Spent  in minutes  30   Susa Raring M.D on 02/27/2021 at 11:16 AM  To page go to [email or web address]   [facility] -  Office  [phone]  See all Orders from today for further details    Objective:   Vitals:   02/26/21 2350 02/27/21 0555 02/27/21 0927 02/27/21 1036  BP: (!) 146/77 (!) 161/115 (!) 154/95 (!) 149/91  Pulse: 90 95 100 (!) 110  Resp: Temp: 98.4 F (36.9 C) 98.2 F (36.8 C) 98.5 F (36.9 C)   TempSrc: Oral Oral Oral   SpO2: 93% 97% 95% 93%  Weight:  (!) 142.1 kg    Height:        Wt Readings from Last 3 Encounters:  02/27/21 (!) 142.1 kg     Intake/Output Summary (Last 24 hours) at 02/27/2021 1116 Last data filed at 02/26/2021 2354 Gross per 24 hour  Intake 480 ml  Output 2950 ml  Net -2470 ml     Physical Exam  Awake Alert x2, No new F.N deficits, Mild DTs, L.Elbow is less red and minimally tender  - improved [place].AT,PERRAL Supple Neck, No JVD,   Symmetrical Chest wall movement, Good air movement bilaterally, CTAB RRR,No Gallops, Rubs or new Murmurs,  +ve B.Sounds, Abd Soft, No tenderness,   No Cyanosis, Clubbing or edema     Data Review:    CBC Recent Labs  Lab 02/23/21 0158 02/24/21 0153 02/25/21 0218 02/26/21 0110 02/27/21 0442  WBC 10.1 10.2 8.6 8.5 8.2  HGB 14.3 13.4 14.0 14.4 14.2  HCT 43.2 39.0 40.4 42.6 41.6  PLT 151 162 196 233 248  MCV 103.6* 102.6* 101.3* 103.4* 102.7*  MCH 34.3* 35.3* 35.1* 35.0* 35.1*  MCHC 33.1 34.4 34.7 33.8 34.1  RDW 12.3 12.1 11.9 12.1 12.1  LYMPHSABS 0.6* 0.6* 0.3* 0.2* 0.4*  MONOABS 0.9 1.0 1.0 0.2 0.6  EOSABS 0.1 0.2 0.0 0.1 0.0  BASOSABS 0.0 0.1 0.0 0.0 0.0    Recent Labs  Lab 02/23/21 0158 02/24/21 0153 02/25/21 0218 02/26/21 0110 02/27/21 0442  NA 130* 128* 129* 136 134*  K 4.1 4.1 4.1 4.2 4.3  CL 89* 91* 93* 99 95*  CO2 33* 33*  GLUCOSE 97 98 123* 115* 125*  BUN CREATININE 0.65 0.66 0.56* 0.59* 0.56*  CALCIUM 9.1 8.8* 9.0 9.4 9.7  AST 39 40  ALT 41 54*  ALKPHOS 45 38 46 48 46  BILITOT 1.7* 1.2 0.6 0.7 0.7  ALBUMIN 3.4* 3.0* 3.1* 3.2* 3.1*  MG 1.9 1.8 1.9 1.9 2.0  PROCALCITON  --  <0.10 <0.10 <0.10  --   BNP 208.9* 162.3* 448.8* 335.3* 622.6*    ------------------------------------------------------------------------------------------------------------------ No results for input(s): CHOL, HDL, LDLCALC, TRIG, CHOLHDL, LDLDIRECT in the last 72 hours.  No results found for: HGBA1C ------------------------------------------------------------------------------------------------------------------ No results for input(s): TSH, T4TOTAL, T3FREE, THYROIDAB in the last 72 hours.  Invalid input(s): FREET3   Cardiac Enzymes No results  for input(s): CKMB, TROPONINI, MYOGLOBIN in the last 168 hours.  Invalid input(s): CK ------------------------------------------------------------------------------------------------------------------    Component Value Date/Time   BNP 622.6 (H) 02/27/2021 0442     Radiology Reports DG Chest 2 View  Result Date: 02/19/2021 CLINICAL DATA:  50 year old male with shortness of breath. EXAM: CHEST - 2 VIEW COMPARISON:  03/04/2014 FINDINGS: The mediastinal contours are within normal limits. Mild cardiomegaly. Low lung volumes. My cephalization of pulmonary vasculature. Mild diffuse hazy pulmonary opacities with a bibasilar predominance. No pneumothorax or evidence of significant pleural effusion. No acute osseous abnormality. IMPRESSION: Mild pulmonary edema. Electronically Signed   By: Marliss Coots M.D.   On: 02/19/2021 13:59   DG ELBOW COMPLETE LEFT (3+VIEW)  Result Date: 02/24/2021 CLINICAL DATA:  LEFT posterior elbow pain EXAM: LEFT ELBOW - COMPLETE 3+ VIEW COMPARISON:  None. FINDINGS: No evidence of fracture of the ulna or humerus. The radial head is normal. No joint effusion. IMPRESSION: No fracture or dislocation. Electronically Signed   By: Genevive Bi M.D.   On:  02/24/2021 09:06   DG Forearm Left  Result Date: 02/24/2021 CLINICAL DATA:  Fall. EXAM: LEFT FOREARM - 2 VIEW COMPARISON:  Left elbow x-ray same day. FINDINGS: There is no acute fracture or dislocation. Joint spaces are maintained. Small enthesophyte is again noted from the anterior distal humerus. There is soft tissue swelling posterior to the elbow. IV is noted in the anterior forearm. IMPRESSION: 1. No acute bony abnormality. Electronically Signed   By: Darliss Cheney M.D.   On: 02/24/2021 16:13   CT Head Wo Contrast  Result Date: 02/19/2021 CLINICAL DATA:  Neuro deficit, acute, stroke suspected EXAM: CT HEAD WITHOUT CONTRAST TECHNIQUE: Contiguous axial images were obtained from the base of the skull through the vertex without intravenous contrast. COMPARISON:  None. FINDINGS: Brain: No evidence of acute infarction, hemorrhage, hydrocephalus, extra-axial collection or mass lesion/mass effect. Vascular: No hyperdense vessel or unexpected calcification. Skull: Normal. Negative for fracture or focal lesion. Sinuses/Orbits: Mild mucosal thickening of the LEFT greater than RIGHT maxillary sinuses. Other: None. IMPRESSION: No acute intracranial abnormality. Electronically Signed   By: Meda Klinefelter M.D.   On: 02/19/2021 14:57   MR BRAIN WO CONTRAST  Result Date: 02/19/2021 CLINICAL DATA:  Neuro deficit, acute, stroke suspected. Generalized weakness and dizziness. EXAM: MRI HEAD WITHOUT CONTRAST TECHNIQUE: Multiplanar, multiecho pulse sequences of the brain and surrounding structures were obtained without intravenous contrast. COMPARISON:  Head CT same day FINDINGS: Brain: The brain has a normal appearance without evidence of malformation, atrophy, old or acute small or large vessel infarction, mass lesion, hemorrhage, hydrocephalus or extra-axial collection. Vascular: Major vessels at the base of the brain show flow. Venous sinuses appear patent. Skull and upper cervical spine: Normal.  Sinuses/Orbits: Clear/normal. Other: None significant. IMPRESSION: Normal MRI of the brain. Electronically Signed   By: Paulina Fusi M.D.   On: 02/19/2021 18:23   MR THORACIC SPINE WO CONTRAST  Result Date: 02/19/2021 CLINICAL DATA:  Mid back pain.  Weakness and dizziness. EXAM: MRI THORACIC SPINE WITHOUT CONTRAST TECHNIQUE: Multiplanar, multisequence MR imaging of the thoracic spine was performed. No intravenous contrast was administered. COMPARISON:  None. FINDINGS: Alignment:  Normal Vertebrae: Normal Cord: Thoracic cord is normal. No focal lesion. No cord compression. Paraspinal and other soft tissues: Normal Disc levels: No evidence of disc degeneration, bulge or herniation. No facet arthropathy. Insignificant superior endplate Schmorl's node at T9 without edema. IMPRESSION: Normal MRI of the thoracic spine. No cord abnormality. No abnormality  seen to explain regional pain. Electronically Signed   By: Paulina Fusi M.D.   On: 02/19/2021 18:25   MR LUMBAR SPINE WO CONTRAST  Result Date: 02/19/2021 CLINICAL DATA:  Low back pain. Cauda equina syndrome. Generalized weakness. EXAM: MRI LUMBAR SPINE WITHOUT CONTRAST TECHNIQUE: Multiplanar, multisequence MR imaging of the lumbar spine was performed. No intravenous contrast was administered. COMPARISON:  None. FINDINGS: Segmentation:  5 lumbar type vertebral bodies assumed. Alignment:  Mild curvature convex to the left with the apex at L3. Vertebrae:  No fracture or focal bone lesion. Conus medullaris and cauda equina: Conus extends to the L1 level. Conus and cauda equina appear normal. Paraspinal and other soft tissues: Negative Disc levels: L1-2: Mild noncompressive disc bulge. L2-3: Mild noncompressive disc bulge. L3-4: Mild noncompressive disc bulge. L4-5: Mild bulging of the disc. Mild facet and ligamentous hypertrophy. Mild narrowing of the lateral recesses left more than right. Some potential the left L5 nerve could be affected in this location. L5-S1:  Normal interspace. IMPRESSION: Mild non-compressive disc bulges at L1-2, L2-3 and L3-4. L4-5 disc bulge more prominent towards the left. Mild facet and ligamentous hypertrophy. Mild stenosis of the left lateral recess at this level that could possibly cause compression of the left L5 nerve. Electronically Signed   By: Paulina Fusi M.D.   On: 02/19/2021 18:27   DG Chest Port 1 View  Result Date: 02/27/2021 CLINICAL DATA:  Acute respiratory failure with hypoxia. EXAM: PORTABLE CHEST 1 VIEW COMPARISON:  February 24, 2021. FINDINGS: Stable cardiomegaly. No pneumothorax or pleural effusion is noted. Stable chronic interstitial densities as described above. No definite acute abnormality is noted. Bony thorax is unremarkable. IMPRESSION: No acute cardiopulmonary abnormality seen. Electronically Signed   By: Lupita Raider M.D.   On: 02/27/2021 09:25   DG Chest Port 1 View  Result Date: 02/24/2021 CLINICAL DATA:  Shortness of breath. EXAM: PORTABLE CHEST 1 VIEW COMPARISON:  02/21/2021 FINDINGS: 0732 hours. Low volume film. The cardio pericardial silhouette is enlarged. Interstitial markings are diffusely coarsened with chronic features. The lungs are clear without focal pneumonia, edema, pneumothorax or pleural effusion. The visualized bony structures of the thorax show no acute abnormality. Telemetry leads overlie the chest. IMPRESSION: Low volume film with chronic interstitial coarsening. Electronically Signed   By: Kennith Center M.D.   On: 02/24/2021 07:50   DG Chest Port 1 View  Result Date: 02/21/2021 CLINICAL DATA:  Shortness of breath. EXAM: PORTABLE CHEST 1 VIEW COMPARISON:  February 19, 2021. FINDINGS: The heart size and mediastinal contours are within normal limits. Minimal bibasilar subsegmental atelectasis or possibly edema is noted which is slightly improved compared to prior exam. The visualized skeletal structures are unremarkable. IMPRESSION: Slightly improved bibasilar opacities as described  above. Electronically Signed   By: Lupita Raider M.D.   On: 02/21/2021 08:59   ECHOCARDIOGRAM COMPLETE  Result Date: 02/20/2021    ECHOCARDIOGRAM REPORT   Patient Name:   LENNON BOUTWELL Date of Exam: 02/20/2021 Medical Rec #:  [id number]      Height:       76.0 in Accession #:    [id number]     Weight:       335.3 lb Date of Birth:  1971-03-23      BSA:          2.759 m Patient Age:    50 years       BP:           153/130 mmHg Patient  Gender: M              HR:           111 bpm. Exam Location:  Inpatient Procedure: 2D Echo Indications:    acute systolic chf  History:        Patient has no prior history of Echocardiogram examinations.                 Risk Factors:Current Smoker, Alcohol use and Hypertension.  Sonographer:    Delcie Roch RDCS Referring Phys: [id number] Deno Lunger [place]AL[place] IMPRESSIONS  1. Left ventricular ejection fraction, by estimation, is 60 to 65%. The left ventricle has normal function. The left ventricle has no regional wall motion abnormalities. Left ventricular diastolic function could not be evaluated.  2. Right ventricular systolic function is normal. The right ventricular size is normal. Tricuspid regurgitation signal is inadequate for assessing PA pressure.  3. The mitral valve is grossly normal. Trivial mitral valve regurgitation. No evidence of mitral stenosis.  4. The aortic valve was not well visualized. There is mild calcification of the aortic valve. Aortic valve regurgitation is not visualized. Mild aortic valve sclerosis is present, with no evidence of aortic valve stenosis. Comparison(s): No prior Echocardiogram. Conclusion(s)/Recommendation(s): Normal biventricular function without evidence of hemodynamically significant valvular heart disease. FINDINGS  Left Ventricle: Left ventricular ejection fraction, by estimation, is 60 to 65%. The left ventricle has normal function. The left ventricle has no regional wall motion abnormalities. The left ventricular internal cavity  size was normal in size. There is  no left ventricular hypertrophy. Left ventricular diastolic function could not be evaluated. Right Ventricle: The right ventricular size is normal. Right vetricular wall thickness was not well visualized. Right ventricular systolic function is normal. Tricuspid regurgitation signal is inadequate for assessing PA pressure. Left Atrium: Left atrial size was normal in size. Right Atrium: Right atrial size was normal in size. Pericardium: There is no evidence of pericardial effusion. Presence of pericardial fat pad. Mitral Valve: The mitral valve is grossly normal. Trivial mitral valve regurgitation. No evidence of mitral valve stenosis. Tricuspid Valve: The tricuspid valve is grossly normal. Tricuspid valve regurgitation is trivial. No evidence of tricuspid stenosis. Aortic Valve: The aortic valve was not well visualized. There is mild calcification of the aortic valve. Aortic valve regurgitation is not visualized. Mild aortic valve sclerosis is present, with no evidence of aortic valve stenosis. Pulmonic Valve: The pulmonic valve was not well visualized. Pulmonic valve regurgitation is not visualized. Aorta: The aortic root and ascending aorta are structurally normal, with no evidence of dilitation. Venous: The inferior vena cava was not well visualized. IAS/Shunts: The interatrial septum was not well visualized.  LEFT VENTRICLE PLAX 2D LVIDd:         6.00 cm LVIDs:         3.80 cm LV PW:         1.10 cm LV IVS:        1.10 cm LVOT diam:     2.80 cm LV SV:         101 LV SV Index:   37 LVOT Area:     6.16 cm  RIGHT VENTRICLE RV S prime:     15.80 cm/s TAPSE (M-mode): 2.4 cm LEFT ATRIUM           Index        RIGHT ATRIUM           Index LA diam:      4.40  cm 1.59 cm/m   RA Area:     19.00 cm LA Vol (A4C): 71.1 ml 25.77 ml/m  RA Volume:   51.10 ml  18.52 ml/m  AORTIC VALVE LVOT Vmax:   103.00 cm/s LVOT Vmean:  67.700 cm/s LVOT VTI:    0.164 m  AORTA Ao Root diam: 3.60 cm  SHUNTS  Systemic VTI:  0.16 m Systemic Diam: 2.80 cm Jodelle Red MD Electronically signed by Jodelle Red MD Signature Date/Time: 02/20/2021/1:32:52 PM    Final    US Abdomen Limited RUQ (LIVER/GB)  Result Date: 02/19/2021 CLINICAL DATA:  Cirrhosis. EXAM: ULTRASOUND ABDOMEN LIMITED RIGHT UPPER QUADRANT COMPARISON:  None. FINDINGS: Gallbladder: Partially distended. No visualized gallstones. Diffuse gallbladder wall thickening at 6 mm. No sonographic Murphy sign noted by sonographer. Common bile duct: Diameter: 5 mm, normal. Liver: Heterogeneous, coarsened, and diffusely increased in parenchymal echogenicity. The liver parenchyma is difficult to penetrate. Allowing for this, no focal lesion is seen. There may be a subtle capsular nodularity. Portal vein is patent on color Doppler imaging with normal direction of blood flow towards the liver. Other: No right upper quadrant ascites. IMPRESSION: 1. Heterogeneous, coarsened, and increased hepatic parenchymal echogenicity. There may be mild capsular nodularity. Findings consistent with steatosis and possible cirrhosis. 2. Liver parenchyma is difficult to penetrate, no focal lesion is seen. 3. Mild gallbladder wall thickening is nonspecific in the setting of chronic liver disease. No gallstones. No biliary dilatation. Electronically Signed   By: Narda Rutherford M.D.   On: 02/19/2021 21:05

## 2021-02-28 LAB — CBC WITH DIFFERENTIAL/PLATELET
Abs Immature Granulocytes: 0.02 10*3/uL (ref 0.00–0.07)
Basophils Absolute: 0 10*3/uL (ref 0.0–0.1)
Basophils Relative: 0 %
Eosinophils Absolute: 0 10*3/uL (ref 0.0–0.5)
Eosinophils Relative: 0 %
HCT: 42.7 % (ref 39.0–52.0)
Hemoglobin: 14.3 g/dL (ref 13.0–17.0)
Immature Granulocytes: 0 %
Lymphocytes Relative: 7 %
Lymphs Abs: 0.5 10*3/uL — ABNORMAL LOW (ref 0.7–4.0)
MCH: 34.3 pg — ABNORMAL HIGH (ref 26.0–34.0)
MCHC: 33.5 g/dL (ref 30.0–36.0)
MCV: 102.4 fL — ABNORMAL HIGH (ref 80.0–100.0)
Monocytes Absolute: 0.6 10*3/uL (ref 0.1–1.0)
Monocytes Relative: 9 %
Neutro Abs: 5.8 10*3/uL (ref 1.7–7.7)
Neutrophils Relative %: 84 %
Platelets: 240 10*3/uL (ref 150–400)
RBC: 4.17 MIL/uL — ABNORMAL LOW (ref 4.22–5.81)
RDW: 12 % (ref 11.5–15.5)
WBC: 7 10*3/uL (ref 4.0–10.5)
nRBC: 0 % (ref 0.0–0.2)

## 2021-02-28 LAB — COMPREHENSIVE METABOLIC PANEL
ALT: 88 U/L — ABNORMAL HIGH (ref 0–44)
AST: 66 U/L — ABNORMAL HIGH (ref 15–41)
Albumin: 3.2 g/dL — ABNORMAL LOW (ref 3.5–5.0)
Alkaline Phosphatase: 44 U/L (ref 38–126)
Anion gap: 7 (ref 5–15)
BUN: 16 mg/dL (ref 6–20)
CO2: 32 mmol/L (ref 22–32)
Calcium: 9.4 mg/dL (ref 8.9–10.3)
Chloride: 93 mmol/L — ABNORMAL LOW (ref 98–111)
Creatinine, Ser: 0.67 mg/dL (ref 0.61–1.24)
GFR, Estimated: >60 mL/min (ref >60)
Glucose, Bld: 119 mg/dL — ABNORMAL HIGH (ref 70–99)
Potassium: 4 mmol/L (ref 3.5–5.1)
Sodium: 132 mmol/L — ABNORMAL LOW (ref 135–145)
Total Bilirubin: 0.7 mg/dL (ref 0.3–1.2)
Total Protein: 6.2 g/dL — ABNORMAL LOW (ref 6.5–8.1)

## 2021-02-28 LAB — MAGNESIUM: Magnesium: 1.8 mg/dL (ref 1.7–2.4)

## 2021-02-28 LAB — VITAMIN E
Vitamin E (Alpha Tocopherol): 6.4 mg/L — ABNORMAL LOW (ref 7.0–25.1)
Vitamin E(Gamma Tocopherol): 1.6 mg/L (ref 0.5–5.5)

## 2021-02-28 LAB — BRAIN NATRIURETIC PEPTIDE: B Natriuretic Peptide: 497.3 pg/mL — ABNORMAL HIGH (ref 0.0–100.0)

## 2021-02-28 MED ORDER — CHLORDIAZEPOXIDE HCL 5 MG PO CAPS
5.0000 mg | ORAL_CAPSULE | Freq: Three times a day (TID) | ORAL | Status: DC
  Administered 2021-02-28 – 2021-03-01 (×2): 5 mg via ORAL
  Filled 2021-02-28 (×4): qty 1

## 2021-02-28 MED ORDER — SODIUM CHLORIDE 1 G PO TABS
1.0000 g | ORAL_TABLET | Freq: Two times a day (BID) | ORAL | Status: DC
  Administered 2021-02-28 – 2021-03-01 (×3): 1 g via ORAL
  Filled 2021-02-28 (×3): qty 1

## 2021-02-28 MED ORDER — METHYLPREDNISOLONE SODIUM SUCC 40 MG IJ SOLR
25.0000 mg | Freq: Two times a day (BID) | INTRAMUSCULAR | Status: DC
  Administered 2021-02-28 – 2021-03-01 (×2): 25 mg via INTRAVENOUS
  Filled 2021-02-28 (×2): qty 1

## 2021-02-28 MED ORDER — FUROSEMIDE 40 MG PO TABS
80.0000 mg | ORAL_TABLET | Freq: Once | ORAL | Status: AC
  Administered 2021-02-28: 80 mg via ORAL
  Filled 2021-02-28: qty 2

## 2021-02-28 NOTE — Progress Notes (Signed)
Inpatient Rehab Admissions Coordinator:   Following for my colleague Ottie Glazier.  I will not have a bed for this patient to admit to CIR today. I let him know and will continue to follow for timing of potential admit pending bed availability.   Estill Dooms, PT, DPT Admissions Coordinator [phone] 02/28/21  12:05 PM

## 2021-02-28 NOTE — Progress Notes (Signed)
PROGRESS NOTE                                                                                                                                                                                                             Patient Demographics:    Colin Stevens, is a 50 y.o. male, DOB - January 01, 1971, WGN:[id number]  Outpatient Primary MD for the patient is Rankins, Fanny Dance, MD    LOS - 9  Admit date - 02/19/2021    Chief Complaint  Patient presents with   Shortness of Breath       Brief Narrative (HPI from H&P)  50 year old male with past medical history of hypertension, nicotine dependence, alcohol abuse who presents to [facility] emergency department via EMS with shortness of breath and DTs.   Subjective:   Patient in bed, appears comfortable, denies any headache, no fever, no chest pain or pressure, no shortness of breath , no abdominal pain. No new focal weakness, better joint pains.   Assessment  & Plan :     Acute Hypoxic Resp. Failure due to Acute on Chronic CHF -echocardiogram with EF 60% likely had mild acute on chronic diastolic CHF which has resolved post IV Lasix, continue beta-blocker.  Monitor closely.   2.  DTS - Librium dose adjusted, continue with CIWA protocol, added Catapres, also has borderline B12 deficiency and placed on replacement, also on thiamine and folic acid , seen by neuro. MRI brain unremarkable. Gradually improving.  3.  Hypertensive urgency.  Treating DTs, medications adjusted for blood pressure.  Monitor.  4.  Fall at home.  MRI brain along with spine unremarkable.  Mild L5 left-sided nerve compression which will be monitored, PT OT >> CIR.  5. Ongoing nicotine dependence and alcohol abuse.  Counseled to quit.  6.  Obesity.  BMI of 40.  Follow with PCP for weight loss.  7.  Hypomagnesemia.  Replaced.  8.  Ultrasound suggestive of cirrhosis with thrombocytopenia on ultrasound.   Likely alcoholic cirrhosis, thrombocytopenia likely due to it.  Negative acute hepatitis panel.  9.  Hyponatremia.  Initially CHF and has been diuresed, diuretics were stopped on 02/22/2021, subsequently no edema and somewhat dehydrated received a liter and a half of IV fluids on 02/23/2021, sodium has dropped further on 02/25/2019 was questioning if he is developing SIADH, urine osmolality  remains much higher than serum, serum uric acid is low normal, post challenge of low-dose Lasix, salt tablets >> added Samsca on 02/25/21 x 1 - much better.  10.  Left elbow redness and pain, both big toes and some in R. Wrist .  Most likely gout, according to the wife.  Started on 02/23/2021, Ortho on board, x-ray stable, patient has no recollection of any injury but previous history of gout, uric acid is low normal but could be due to consumption.  Empiric short course of antibiotic - Keflex - done, much improved after IV steroid being tapered , appreciate orthopedic input.  Not enough fluid for an aspiration at this time.      Condition -   Guarded  Family Communication  :  Wife bedside 02/20/21, called [phone] on 02/21/21, 02/23/21, 02/24/21 - bedside, 02/26/21, 02/28/21  Code Status :  Full  Consults  :  Neuro, Ortho  PUD Prophylaxis : PPI   Procedures  :     TTE - 1. Left ventricular ejection fraction, by estimation, is 60 to 65%. The left ventricle has normal function. The left ventricle has no regional wall motion abnormalities. Left ventricular diastolic function could not be evaluated.  2. Right ventricular systolic function is normal. The right ventricular size is normal. Tricuspid regurgitation signal is inadequate for assessing PA pressure.  3. The mitral valve is grossly normal. Trivial mitral valve regurgitation. No evidence of mitral stenosis.  4. The aortic valve was not well visualized. There is mild calcification of the aortic valve. Aortic valve regurgitation is not visualized. Mild  aortic valve sclerosis is present, with no evidence of aortic valve stenosis.  RUQ [place] - Cirrhosis  MRI Brain and Spine - non acute MRI brain, MRI spine showing similar L-spine disease with possible mild L5 nerve compression on the left side.       Disposition Plan  :    Status is: Inpatient  Remains inpatient appropriate because: Severe DTs   DVT Prophylaxis  :  Lovenox   Lab Results  Component Value Date   PLT 240 02/28/2021    Diet :  Diet Order             DIET SOFT Room service appropriate? Yes; Fluid consistency: Thin; Fluid restriction: 1500 mL Fluid  Diet effective now                    Inpatient Medications  Scheduled Meds:  carvedilol  12.5 mg Oral BID WC   chlordiazePOXIDE  10 mg Oral TID   cloNIDine  0.1 mg Oral BID   colchicine  0.6 mg Oral BID   enoxaparin (LOVENOX) injection  75 mg Subcutaneous Q24H   folic acid  1 mg Oral Daily   furosemide  80 mg Oral Once   lisinopril  5 mg Oral Daily   methylPREDNISolone (SOLU-MEDROL) injection  25 mg Intravenous Q12H   multivitamin with minerals  1 tablet Oral Daily   nicotine  21 mg Transdermal Daily   nystatin  5 mL Mouth/Throat QID   pantoprazole  40 mg Oral Daily   sodium chloride  1 g Oral BID WC   thiamine  100 mg Oral Daily   vitamin B-12  1,000 mcg Oral Daily   Continuous Infusions:   PRN Meds:.acetaminophen **OR** acetaminophen, albuterol, diclofenac Sodium, loperamide, [DISCONTINUED] ondansetron **OR** ondansetron (ZOFRAN) IV, polyethylene glycol  Antibiotics  :    Anti-infectives (From admission, onward)    Start  Dose/Rate Route Frequency Ordered Stop   02/25/21 1800  cephALEXin (KEFLEX) capsule 500 mg  Status:  Discontinued        500 mg Oral Every 6 hours 02/25/21 1403 02/28/21 1035   02/24/21 1100  ceFAZolin (ANCEF) IVPB 1 g/50 mL premix  Status:  Discontinued        1 g 100 mL/hr over 30 Minutes Intravenous Every 8 hours 02/24/21 0932 02/25/21 1403        Time Spent in  minutes  30   Susa Raring M.D on 02/28/2021 at 10:35 AM  To page go to [email or web address]   [facility] -  Office  [phone]  See all Orders from today for further details    Objective:   Vitals:   02/27/21 2311 02/28/21 0351 02/28/21 0600 02/28/21 1015  BP: (!) 144/94 (!) 139/94  129/80  Pulse: 90 92  (!) 105  Resp: 20 18  18   Temp: 97.8 F (36.6 C) 98.3 F (36.8 C)  97.6 F (36.4 C)  TempSrc: Oral Oral  Oral  SpO2: 90% 92%  92%  Weight:   (!) 142.2 kg   Height:   6\' 4"  (1.93 m)     Wt Readings from Last 3 Encounters:  02/28/21 (!) 142.2 kg     Intake/Output Summary (Last 24 hours) at 02/28/2021 1035 Last data filed at 02/28/2021 1032 Gross per 24 hour  Intake --  Output 2800 ml  Net -2800 ml     Physical Exam  Awake Alert, No new F.N deficits, minimal DTs [place].AT,PERRAL Supple Neck, No JVD,   Symmetrical Chest wall movement, Good air movement bilaterally, CTAB RRR,No Gallops, Rubs or new Murmurs,  +ve B.Sounds, Abd Soft, No tenderness,   No Cyanosis, Clubbing or edema      Data Review:    CBC Recent Labs  Lab 02/24/21 0153 02/25/21 0218 02/26/21 0110 02/27/21 0442 02/28/21 0118  WBC 10.2 8.6 8.5 8.2 7.0  HGB 13.4 14.0 14.4 14.2 14.3  HCT 39.0 40.4 42.6 41.6 42.7  PLT 162 196 233 248 240  MCV 102.6* 101.3* 103.4* 102.7* 102.4*  MCH 35.3* 35.1* 35.0* 35.1* 34.3*  MCHC 34.4 34.7 33.8 34.1 33.5  RDW 12.1 11.9 12.1 12.1 12.0  LYMPHSABS 0.6* 0.3* 0.2* 0.4* 0.5*  MONOABS 1.0 1.0 0.2 0.6 0.6  EOSABS 0.2 0.0 0.1 0.0 0.0  BASOSABS 0.1 0.0 0.0 0.0 0.0    Recent Labs  Lab 02/24/21 0153 02/25/21 0218 02/26/21 0110 02/27/21 0442 02/28/21 0118  NA 128* 129* 136 134* 132*  K 4.1 4.1 4.2 4.3 4.0  CL 91* 93* 99 95* 93*  CO2 31 29 29  33* 32  GLUCOSE 98 123* 115* 125* 119*  BUN 10 10 8 14 16   CREATININE 0.66 0.56* 0.59* 0.56* 0.67  CALCIUM 8.8* 9.0 9.4 9.7 9.4  AST 17 21 39 40 66*  ALT 20 22 41 54* 88*  ALKPHOS 38 46 48 46 44   BILITOT 1.2 0.6 0.7 0.7 0.7  ALBUMIN 3.0* 3.1* 3.2* 3.1* 3.2*  MG 1.8 1.9 1.9 2.0 1.8  PROCALCITON <0.10 <0.10 <0.10  --   --   BNP 162.3* 448.8* 335.3* 622.6* 497.3*    ------------------------------------------------------------------------------------------------------------------ No results for input(s): CHOL, HDL, LDLCALC, TRIG, CHOLHDL, LDLDIRECT in the last 72 hours.  No results found for: HGBA1C ------------------------------------------------------------------------------------------------------------------ No results for input(s): TSH, T4TOTAL, T3FREE, THYROIDAB in the last 72 hours.  Invalid input(s): FREET3   Cardiac Enzymes No results for input(s): CKMB, TROPONINI, MYOGLOBIN in  the last 168 hours.  Invalid input(s): CK ------------------------------------------------------------------------------------------------------------------    Component Value Date/Time   BNP 497.3 (H) 02/28/2021 0118     Radiology Reports DG Chest 2 View  Result Date: 02/19/2021 CLINICAL DATA:  50 year old male with shortness of breath. EXAM: CHEST - 2 VIEW COMPARISON:  03/04/2014 FINDINGS: The mediastinal contours are within normal limits. Mild cardiomegaly. Low lung volumes. My cephalization of pulmonary vasculature. Mild diffuse hazy pulmonary opacities with a bibasilar predominance. No pneumothorax or evidence of significant pleural effusion. No acute osseous abnormality. IMPRESSION: Mild pulmonary edema. Electronically Signed   By: Marliss Coots M.D.   On: 02/19/2021 13:59   DG ELBOW COMPLETE LEFT (3+VIEW)  Result Date: 02/24/2021 CLINICAL DATA:  LEFT posterior elbow pain EXAM: LEFT ELBOW - COMPLETE 3+ VIEW COMPARISON:  None. FINDINGS: No evidence of fracture of the ulna or humerus. The radial head is normal. No joint effusion. IMPRESSION: No fracture or dislocation. Electronically Signed   By: Genevive Bi M.D.   On: 02/24/2021 09:06   DG Forearm Left  Result Date:  02/24/2021 CLINICAL DATA:  Fall. EXAM: LEFT FOREARM - 2 VIEW COMPARISON:  Left elbow x-ray same day. FINDINGS: There is no acute fracture or dislocation. Joint spaces are maintained. Small enthesophyte is again noted from the anterior distal humerus. There is soft tissue swelling posterior to the elbow. IV is noted in the anterior forearm. IMPRESSION: 1. No acute bony abnormality. Electronically Signed   By: Darliss Cheney M.D.   On: 02/24/2021 16:13   CT Head Wo Contrast  Result Date: 02/19/2021 CLINICAL DATA:  Neuro deficit, acute, stroke suspected EXAM: CT HEAD WITHOUT CONTRAST TECHNIQUE: Contiguous axial images were obtained from the base of the skull through the vertex without intravenous contrast. COMPARISON:  None. FINDINGS: Brain: No evidence of acute infarction, hemorrhage, hydrocephalus, extra-axial collection or mass lesion/mass effect. Vascular: No hyperdense vessel or unexpected calcification. Skull: Normal. Negative for fracture or focal lesion. Sinuses/Orbits: Mild mucosal thickening of the LEFT greater than RIGHT maxillary sinuses. Other: None. IMPRESSION: No acute intracranial abnormality. Electronically Signed   By: Meda Klinefelter M.D.   On: 02/19/2021 14:57   MR BRAIN WO CONTRAST  Result Date: 02/19/2021 CLINICAL DATA:  Neuro deficit, acute, stroke suspected. Generalized weakness and dizziness. EXAM: MRI HEAD WITHOUT CONTRAST TECHNIQUE: Multiplanar, multiecho pulse sequences of the brain and surrounding structures were obtained without intravenous contrast. COMPARISON:  Head CT same day FINDINGS: Brain: The brain has a normal appearance without evidence of malformation, atrophy, old or acute small or large vessel infarction, mass lesion, hemorrhage, hydrocephalus or extra-axial collection. Vascular: Major vessels at the base of the brain show flow. Venous sinuses appear patent. Skull and upper cervical spine: Normal. Sinuses/Orbits: Clear/normal. Other: None significant. IMPRESSION:  Normal MRI of the brain. Electronically Signed   By: Paulina Fusi M.D.   On: 02/19/2021 18:23   MR THORACIC SPINE WO CONTRAST  Result Date: 02/19/2021 CLINICAL DATA:  Mid back pain.  Weakness and dizziness. EXAM: MRI THORACIC SPINE WITHOUT CONTRAST TECHNIQUE: Multiplanar, multisequence MR imaging of the thoracic spine was performed. No intravenous contrast was administered. COMPARISON:  None. FINDINGS: Alignment:  Normal Vertebrae: Normal Cord: Thoracic cord is normal. No focal lesion. No cord compression. Paraspinal and other soft tissues: Normal Disc levels: No evidence of disc degeneration, bulge or herniation. No facet arthropathy. Insignificant superior endplate Schmorl's node at T9 without edema. IMPRESSION: Normal MRI of the thoracic spine. No cord abnormality. No abnormality seen to explain regional pain. Electronically  Signed   By: Paulina Fusi M.D.   On: 02/19/2021 18:25   MR LUMBAR SPINE WO CONTRAST  Result Date: 02/19/2021 CLINICAL DATA:  Low back pain. Cauda equina syndrome. Generalized weakness. EXAM: MRI LUMBAR SPINE WITHOUT CONTRAST TECHNIQUE: Multiplanar, multisequence MR imaging of the lumbar spine was performed. No intravenous contrast was administered. COMPARISON:  None. FINDINGS: Segmentation:  5 lumbar type vertebral bodies assumed. Alignment:  Mild curvature convex to the left with the apex at L3. Vertebrae:  No fracture or focal bone lesion. Conus medullaris and cauda equina: Conus extends to the L1 level. Conus and cauda equina appear normal. Paraspinal and other soft tissues: Negative Disc levels: L1-2: Mild noncompressive disc bulge. L2-3: Mild noncompressive disc bulge. L3-4: Mild noncompressive disc bulge. L4-5: Mild bulging of the disc. Mild facet and ligamentous hypertrophy. Mild narrowing of the lateral recesses left more than right. Some potential the left L5 nerve could be affected in this location. L5-S1: Normal interspace. IMPRESSION: Mild non-compressive disc bulges at  L1-2, L2-3 and L3-4. L4-5 disc bulge more prominent towards the left. Mild facet and ligamentous hypertrophy. Mild stenosis of the left lateral recess at this level that could possibly cause compression of the left L5 nerve. Electronically Signed   By: Paulina Fusi M.D.   On: 02/19/2021 18:27   DG Chest Port 1 View  Result Date: 02/27/2021 CLINICAL DATA:  Acute respiratory failure with hypoxia. EXAM: PORTABLE CHEST 1 VIEW COMPARISON:  February 24, 2021. FINDINGS: Stable cardiomegaly. No pneumothorax or pleural effusion is noted. Stable chronic interstitial densities as described above. No definite acute abnormality is noted. Bony thorax is unremarkable. IMPRESSION: No acute cardiopulmonary abnormality seen. Electronically Signed   By: Lupita Raider M.D.   On: 02/27/2021 09:25   DG Chest Port 1 View  Result Date: 02/24/2021 CLINICAL DATA:  Shortness of breath. EXAM: PORTABLE CHEST 1 VIEW COMPARISON:  02/21/2021 FINDINGS: 0732 hours. Low volume film. The cardio pericardial silhouette is enlarged. Interstitial markings are diffusely coarsened with chronic features. The lungs are clear without focal pneumonia, edema, pneumothorax or pleural effusion. The visualized bony structures of the thorax show no acute abnormality. Telemetry leads overlie the chest. IMPRESSION: Low volume film with chronic interstitial coarsening. Electronically Signed   By: Kennith Center M.D.   On: 02/24/2021 07:50   DG Chest Port 1 View  Result Date: 02/21/2021 CLINICAL DATA:  Shortness of breath. EXAM: PORTABLE CHEST 1 VIEW COMPARISON:  February 19, 2021. FINDINGS: The heart size and mediastinal contours are within normal limits. Minimal bibasilar subsegmental atelectasis or possibly edema is noted which is slightly improved compared to prior exam. The visualized skeletal structures are unremarkable. IMPRESSION: Slightly improved bibasilar opacities as described above. Electronically Signed   By: Lupita Raider M.D.   On:  02/21/2021 08:59   ECHOCARDIOGRAM COMPLETE  Result Date: 02/20/2021    ECHOCARDIOGRAM REPORT   Patient Name:   TERRANCE USERY Date of Exam: 02/20/2021 Medical Rec #:  [id number]      Height:       76.0 in Accession #:    [id number]     Weight:       335.3 lb Date of Birth:  [birth year]      BSA:          2.759 m Patient Age:    50 years       BP:           153/130 mmHg Patient Gender: M  HR:           111 bpm. Exam Location:  Inpatient Procedure: 2D Echo Indications:    acute systolic chf  History:        Patient has no prior history of Echocardiogram examinations.                 Risk Factors:Current Smoker, Alcohol use and Hypertension.  Sonographer:    Delcie Roch RDCS Referring Phys: [id number] Deno Lunger [place] IMPRESSIONS  1. Left ventricular ejection fraction, by estimation, is 60 to 65%. The left ventricle has normal function. The left ventricle has no regional wall motion abnormalities. Left ventricular diastolic function could not be evaluated.  2. Right ventricular systolic function is normal. The right ventricular size is normal. Tricuspid regurgitation signal is inadequate for assessing PA pressure.  3. The mitral valve is grossly normal. Trivial mitral valve regurgitation. No evidence of mitral stenosis.  4. The aortic valve was not well visualized. There is mild calcification of the aortic valve. Aortic valve regurgitation is not visualized. Mild aortic valve sclerosis is present, with no evidence of aortic valve stenosis. Comparison(s): No prior Echocardiogram. Conclusion(s)/Recommendation(s): Normal biventricular function without evidence of hemodynamically significant valvular heart disease. FINDINGS  Left Ventricle: Left ventricular ejection fraction, by estimation, is 60 to 65%. The left ventricle has normal function. The left ventricle has no regional wall motion abnormalities. The left ventricular internal cavity size was normal in size. There is  no left ventricular  hypertrophy. Left ventricular diastolic function could not be evaluated. Right Ventricle: The right ventricular size is normal. Right vetricular wall thickness was not well visualized. Right ventricular systolic function is normal. Tricuspid regurgitation signal is inadequate for assessing PA pressure. Left Atrium: Left atrial size was normal in size. Right Atrium: Right atrial size was normal in size. Pericardium: There is no evidence of pericardial effusion. Presence of pericardial fat pad. Mitral Valve: The mitral valve is grossly normal. Trivial mitral valve regurgitation. No evidence of mitral valve stenosis. Tricuspid Valve: The tricuspid valve is grossly normal. Tricuspid valve regurgitation is trivial. No evidence of tricuspid stenosis. Aortic Valve: The aortic valve was not well visualized. There is mild calcification of the aortic valve. Aortic valve regurgitation is not visualized. Mild aortic valve sclerosis is present, with no evidence of aortic valve stenosis. Pulmonic Valve: The pulmonic valve was not well visualized. Pulmonic valve regurgitation is not visualized. Aorta: The aortic root and ascending aorta are structurally normal, with no evidence of dilitation. Venous: The inferior vena cava was not well visualized. IAS/Shunts: The interatrial septum was not well visualized.  LEFT VENTRICLE PLAX 2D LVIDd:         6.00 cm LVIDs:         3.80 cm LV PW:         1.10 cm LV IVS:        1.10 cm LVOT diam:     2.80 cm LV SV:         101 LV SV Index:   37 LVOT Area:     6.16 cm  RIGHT VENTRICLE RV S prime:     15.80 cm/s TAPSE (M-mode): 2.4 cm LEFT ATRIUM           Index        RIGHT ATRIUM           Index LA diam:      4.40 cm 1.59 cm/m   RA Area:     19.00 cm LA Vol (  A4C): 71.1 ml 25.77 ml/m  RA Volume:   51.10 ml  18.52 ml/m  AORTIC VALVE LVOT Vmax:   103.00 cm/s LVOT Vmean:  67.700 cm/s LVOT VTI:    0.164 m  AORTA Ao Root diam: 3.60 cm  SHUNTS Systemic VTI:  0.16 m Systemic Diam: 2.80 cm Jodelle Red MD Electronically signed by Jodelle Red MD Signature Date/Time: 02/20/2021/1:32:52 PM    Final    US Abdomen Limited RUQ (LIVER/GB)  Result Date: 02/19/2021 CLINICAL DATA:  Cirrhosis. EXAM: ULTRASOUND ABDOMEN LIMITED RIGHT UPPER QUADRANT COMPARISON:  None. FINDINGS: Gallbladder: Partially distended. No visualized gallstones. Diffuse gallbladder wall thickening at 6 mm. No sonographic Murphy sign noted by sonographer. Common bile duct: Diameter: 5 mm, normal. Liver: Heterogeneous, coarsened, and diffusely increased in parenchymal echogenicity. The liver parenchyma is difficult to penetrate. Allowing for this, no focal lesion is seen. There may be a subtle capsular nodularity. Portal vein is patent on color Doppler imaging with normal direction of blood flow towards the liver. Other: No right upper quadrant ascites. IMPRESSION: 1. Heterogeneous, coarsened, and increased hepatic parenchymal echogenicity. There may be mild capsular nodularity. Findings consistent with steatosis and possible cirrhosis. 2. Liver parenchyma is difficult to penetrate, no focal lesion is seen. 3. Mild gallbladder wall thickening is nonspecific in the setting of chronic liver disease. No gallstones. No biliary dilatation. Electronically Signed   By: Narda Rutherford M.D.   On: 02/19/2021 21:05

## 2021-02-28 NOTE — Progress Notes (Signed)
Occupational Therapy Treatment Patient Details Name: Colin Stevens MRN: [id number] DOB: [birth year] Today's Date: 02/28/2021   History of present illness Pt is a 50 y.o. admitted 02/19/21 with SOB, falls at home. Workup for acute hypoxic respiratory failure secondary to acute on chronic CHF, HTN urgency, DTs. Brain MRI negative for acute abnormality; mild L5 L-side nerve compression. Pt with L elbow pain and redness; xray negative for fx; plan to treat empirically for gout and cellulitis. PMH includes HTN, substance abuse.   OT comments  Patient received in bed and agreeable to OT/PT treatment.  Patient has made good gains this treatment session with bed mobility, sit to stands, functional mobility and transfers. Patient was able to performed mobility and transfers with RW and min assist +2 initially for safety and progressed to +1.  Patient demonstrated less fear of falling and increased independence with sit to stands and mobility.  Acute OT to continue to follow.    Recommendations for follow up therapy are one component of a multi-disciplinary discharge planning process, led by the attending physician.  Recommendations may be updated based on patient status, additional functional criteria and insurance authorization.    Follow Up Recommendations  Acute inpatient rehab (3hours/day)    Assistance Recommended at Discharge Frequent or constant Supervision/Assistance  Equipment Recommendations  Other (comment)    Recommendations for Other Services      Precautions / Restrictions Precautions Precautions: Fall;Other (comment) Precaution Comments: monitor O2, fearful of falling, painful joints w/ gout Restrictions Weight Bearing Restrictions: No       Mobility Bed Mobility Overal bed mobility: Needs Assistance Bed Mobility: Supine to Sit     Supine to sit: Min guard     General bed mobility comments: able to get to eob with min guard for safety    Transfers Overall transfer  level: Needs assistance Equipment used: Rolling walker (2 wheels) Transfers: Stand Pivot Transfers;Sit to/from Stand Sit to Stand: Min assist;+2 safety/equipment Stand pivot transfers: Min assist         General transfer comment: initially min assist +2 for safety and progressed to min asssit of one     Balance Overall balance assessment: Needs assistance Sitting-balance support: No upper extremity supported;Feet supported Sitting balance-Leahy Scale: Fair Sitting balance - Comments: able to sit on eob without assistance   Standing balance support: Bilateral upper extremity supported Standing balance-Leahy Scale: Fair Standing balance comment: reliant on BUE support                           ADL either performed or assessed with clinical judgement   ADL                   Upper Body Dressing : Minimal assistance;Sitting Upper Body Dressing Details (indicate cue type and reason): donned gown seated on eob                 Functional mobility during ADLs: Minimal assistance;Rolling walker (2 wheels) General ADL Comments: demonstrated decreased fear of falling     Vision       Perception     Praxis      Cognition Arousal/Alertness: Awake/alert Behavior During Therapy: Anxious;Flat affect Overall Cognitive Status: Impaired/Different from baseline Area of Impairment: Orientation;Attention;Memory;Following commands;Safety/judgement;Awareness;Problem solving                   Current Attention Level: Sustained Memory: Decreased short-term memory Following Commands: Follows one step commands with increased  time Safety/Judgement: Decreased awareness of safety;Decreased awareness of deficits Awareness: Intellectual Problem Solving: Slow processing General Comments: followed commands and less fear of falling          Exercises     Shoulder Instructions       General Comments O2 removed during treatment with patient mainting O2 sats  from 92-95    Pertinent Vitals/ Pain       Pain Assessment: No/denies pain  Home Living                                          Prior Functioning/Environment              Frequency  Min 2X/week        Progress Toward Goals  OT Goals(current goals can now be found in the care plan section)  Progress towards OT goals: Progressing toward goals  Acute Rehab OT Goals Patient Stated Goal: go home OT Goal Formulation: With patient/family Time For Goal Achievement: 03/07/21 Potential to Achieve Goals: Good ADL Goals Pt Will Perform Grooming: with modified independence;sitting Pt Will Perform Lower Body Bathing: with min assist;sit to/from stand Pt Will Transfer to Toilet: with min assist;stand pivot transfer;bedside commode Additional ADL Goal #1: Pt to demo ability to sit EOB > 5 min during functional tasks with no more than Supervision to maintain balance  Plan Discharge plan remains appropriate    Co-evaluation    PT/OT/SLP Co-Evaluation/Treatment: Yes Reason for Co-Treatment: For patient/therapist safety   OT goals addressed during session: ADL's and self-care      AM-PAC OT "6 Clicks" Daily Activity     Outcome Measure   Help from another person eating meals?: A Little Help from another person taking care of personal grooming?: A Little Help from another person toileting, which includes using toliet, bedpan, or urinal?: Total Help from another person bathing (including washing, rinsing, drying)?: A Lot Help from another person to put on and taking off regular upper body clothing?: A Little Help from another person to put on and taking off regular lower body clothing?: Total 6 Click Score: 13    End of Session Equipment Utilized During Treatment: Gait belt;Rolling walker (2 wheels)  OT Visit Diagnosis: Unsteadiness on feet (R26.81);Other abnormalities of gait and mobility (R26.89);Muscle weakness (generalized) (M62.81);Other symptoms and  signs involving cognitive function   Activity Tolerance Patient tolerated treatment well   Patient Left in chair;with call bell/phone within reach;with chair alarm set;with family/visitor present   Nurse Communication Mobility status;Other (comment) (informed nursing that oxygen was left off)        Time: 7628-3151 OT Time Calculation (min): 25 min  Charges: OT General Charges $OT Visit: 1 Visit OT Treatments $Therapeutic Activity: 8-22 mins  Alfonse Flavors, OTA Acute Rehabilitation Services  Pager [phone] Office [phone]   Dewain Penning 02/28/2021, 9:38 AM

## 2021-02-28 NOTE — Progress Notes (Signed)
Physical Therapy Treatment Patient Details Name: Colin Stevens MRN: [id number] DOB: Sep 09, 1970 Today's Date: 02/28/2021   History of Present Illness Pt is a 50 y.o. admitted 02/19/21 with SOB, falls at home. Workup for acute hypoxic respiratory failure secondary to acute on chronic CHF, HTN urgency, DTs. Brain MRI negative for acute abnormality; mild L5 L-side nerve compression. Pt with L elbow pain and redness; xray negative for fx; plan to treat empirically for gout and cellulitis. PMH includes HTN, substance abuse.    PT Comments    Pt received in bed, wife present in room. Pt presents with mild confusion, continues to clear. Significant improvement noted in mobility. He required min guard assist supine to sit, +2 min assist sit to stand, and min assist ambulation 100' with RW, +2 safety/close chair follow. Deficits noted in strength, balance, cognition, and activity tolerance. Continues with fear of falling, but improving as his confidence in his mobility builds. Fatigues quickly. Shaking/trembling noted with increased activity/distance. Pt mobilized on RA with SpO2 92-95%. Pt in recliner at end of session.   Recommendations for follow up therapy are one component of a multi-disciplinary discharge planning process, led by the attending physician.  Recommendations may be updated based on patient status, additional functional criteria and insurance authorization.  Follow Up Recommendations  Acute inpatient rehab (3hours/day)     Assistance Recommended at Discharge Frequent or constant Supervision/Assistance  Equipment Recommendations  Wheelchair cushion (measurements PT);Wheelchair (measurements PT);3in1 (PT);Rolling walker (2 wheels)    Recommendations for Other Services       Precautions / Restrictions Precautions Precautions: Fall;Other (comment) Precaution Comments: monitor O2, fearful of falling Restrictions Weight Bearing Restrictions: No     Mobility  Bed Mobility Overal  bed mobility: Needs Assistance Bed Mobility: Supine to Sit     Supine to sit: Min guard;HOB elevated     General bed mobility comments: +rail, increased time    Transfers Overall transfer level: Needs assistance Equipment used: Rolling walker (2 wheels) Transfers: Sit to/from Stand Sit to Stand: Min assist;+2 safety/equipment;+2 physical assistance Stand pivot transfers: Min assist         General transfer comment: initially +2 min to power up to stand. Progressed to min assist +2 safety, cues for sequencing    Ambulation/Gait Ambulation/Gait assistance: +2 safety/equipment;+2 physical assistance;Min assist Gait Distance (Feet): 100 Feet (+ 25') Assistive device: Rolling walker (2 wheels) Gait Pattern/deviations: Step-through pattern;Decreased stride length Gait velocity: decreased Gait velocity interpretation: <1.8 ft/sec, indicate of risk for recurrent falls General Gait Details: slow, guard gait; trembling/shaking noted. Initially ambulated in room 25' with RW +2 min assist. This helped build pt's confidence for gait trial in hallway. Pt able to amb in hall 100' min assist with RW, +2 safety/close chair follow   Stairs             Wheelchair Mobility    Modified Rankin (Stroke Patients Only)       Balance Overall balance assessment: Needs assistance Sitting-balance support: No upper extremity supported;Feet supported Sitting balance-Leahy Scale: Fair Sitting balance - Comments: able to sit on eob without assistance   Standing balance support: Bilateral upper extremity supported;During functional activity Standing balance-Leahy Scale: Poor Standing balance comment: reliant on BUE support                            Cognition Arousal/Alertness: Awake/alert Behavior During Therapy: Anxious;Flat affect Overall Cognitive Status: Impaired/Different from baseline Area of Impairment: Orientation;Attention;Memory;Following  commands;Safety/judgement;Awareness;Problem  solving                 Orientation Level: Disoriented to;Time Current Attention Level: Sustained Memory: Decreased short-term memory Following Commands: Follows one step commands with increased time Safety/Judgement: Decreased awareness of safety;Decreased awareness of deficits Awareness: Emergent Problem Solving: Slow processing;Requires verbal cues;Difficulty sequencing General Comments: followed commands and less fear of falling        Exercises      General Comments General comments (skin integrity, edema, etc.): Pt on 2L on arrived, SpO2 95%. O2 removed and pt mobilized on RA with SpO2 92-95%.      Pertinent Vitals/Pain Pain Assessment: No/denies pain    Home Living                          Prior Function            PT Goals (current goals can now be found in the care plan section) Acute Rehab PT Goals Patient Stated Goal: home Progress towards PT goals: Progressing toward goals    Frequency    Min 3X/week      PT Plan Current plan remains appropriate    Co-evaluation PT/OT/SLP Co-Evaluation/Treatment: Yes Reason for Co-Treatment: For patient/therapist safety PT goals addressed during session: Mobility/safety with mobility;Balance;Proper use of DME OT goals addressed during session: ADL's and self-care      AM-PAC PT "6 Clicks" Mobility   Outcome Measure  Help needed turning from your back to your side while in a flat bed without using bedrails?: A Little Help needed moving from lying on your back to sitting on the side of a flat bed without using bedrails?: A Little Help needed moving to and from a bed to a chair (including a wheelchair)?: A Little Help needed standing up from a chair using your arms (e.g., wheelchair or bedside chair)?: A Little Help needed to walk in hospital room?: Total Help needed climbing 3-5 steps with a railing? : Total 6 Click Score: 14    End of Session  Equipment Utilized During Treatment: Gait belt Activity Tolerance: Patient tolerated treatment well Patient left: in chair;with call bell/phone within reach;with chair alarm set;with family/visitor present Nurse Communication: Mobility status PT Visit Diagnosis: Other abnormalities of gait and mobility (R26.89);Muscle weakness (generalized) (M62.81);Other symptoms and signs involving the nervous system (X21.194)     Time: 1740-8144 PT Time Calculation (min) (ACUTE ONLY): 25 min  Charges:  $Gait Training: 8-22 mins                     Aida Raider, PT  Office # [phone] Pager [phone]    Ilda Foil 02/28/2021, 10:32 AM

## 2021-03-01 ENCOUNTER — Encounter (HOSPITAL_COMMUNITY): Payer: Self-pay | Admitting: Physical Medicine & Rehabilitation

## 2021-03-01 ENCOUNTER — Encounter (HOSPITAL_COMMUNITY): Payer: Self-pay | Admitting: Internal Medicine

## 2021-03-01 ENCOUNTER — Other Ambulatory Visit: Payer: Self-pay

## 2021-03-01 ENCOUNTER — Inpatient Hospital Stay (HOSPITAL_COMMUNITY)
Admission: RE | Admit: 2021-03-01 | Discharge: 2021-03-06 | DRG: 945 | Disposition: A | Discharge status: Home / Self Care | Payer: Self-pay | Source: Intra-hospital | Attending: Physical Medicine & Rehabilitation | Admitting: Physical Medicine & Rehabilitation

## 2021-03-01 DIAGNOSIS — K5900 Constipation, unspecified: Secondary | ICD-10-CM | POA: Diagnosis present

## 2021-03-01 DIAGNOSIS — F10130 Alcohol abuse with withdrawal, uncomplicated: Secondary | ICD-10-CM | POA: Diagnosis present

## 2021-03-01 DIAGNOSIS — K7460 Unspecified cirrhosis of liver: Secondary | ICD-10-CM | POA: Diagnosis present

## 2021-03-01 DIAGNOSIS — G4700 Insomnia, unspecified: Secondary | ICD-10-CM | POA: Diagnosis present

## 2021-03-01 DIAGNOSIS — R5381 Other malaise: Principal | ICD-10-CM | POA: Diagnosis present

## 2021-03-01 DIAGNOSIS — E871 Hypo-osmolality and hyponatremia: Secondary | ICD-10-CM | POA: Diagnosis present

## 2021-03-01 DIAGNOSIS — I5032 Chronic diastolic (congestive) heart failure: Secondary | ICD-10-CM | POA: Diagnosis present

## 2021-03-01 DIAGNOSIS — M109 Gout, unspecified: Secondary | ICD-10-CM | POA: Diagnosis present

## 2021-03-01 DIAGNOSIS — Z8249 Family history of ischemic heart disease and other diseases of the circulatory system: Secondary | ICD-10-CM

## 2021-03-01 DIAGNOSIS — I10 Essential (primary) hypertension: Secondary | ICD-10-CM | POA: Diagnosis present

## 2021-03-01 DIAGNOSIS — F1010 Alcohol abuse, uncomplicated: Secondary | ICD-10-CM

## 2021-03-01 DIAGNOSIS — R296 Repeated falls: Secondary | ICD-10-CM | POA: Diagnosis present

## 2021-03-01 DIAGNOSIS — I110 Hypertensive heart disease with heart failure: Secondary | ICD-10-CM | POA: Diagnosis present

## 2021-03-01 DIAGNOSIS — F17210 Nicotine dependence, cigarettes, uncomplicated: Secondary | ICD-10-CM | POA: Diagnosis present

## 2021-03-01 DIAGNOSIS — W108XXD Fall (on) (from) other stairs and steps, subsequent encounter: Secondary | ICD-10-CM | POA: Diagnosis present

## 2021-03-01 LAB — CBC WITH DIFFERENTIAL/PLATELET
Abs Immature Granulocytes: 0.01 10*3/uL (ref 0.00–0.07)
Basophils Absolute: 0 10*3/uL (ref 0.0–0.1)
Basophils Relative: 0 %
Eosinophils Absolute: 0.1 10*3/uL (ref 0.0–0.5)
Eosinophils Relative: 1 %
HCT: 45.2 % (ref 39.0–52.0)
Hemoglobin: 15 g/dL (ref 13.0–17.0)
Immature Granulocytes: 0 %
Lymphocytes Relative: 14 %
Lymphs Abs: 0.9 10*3/uL (ref 0.7–4.0)
MCH: 34 pg (ref 26.0–34.0)
MCHC: 33.2 g/dL (ref 30.0–36.0)
MCV: 102.5 fL — ABNORMAL HIGH (ref 80.0–100.0)
Monocytes Absolute: 0.7 10*3/uL (ref 0.1–1.0)
Monocytes Relative: 11 %
Neutro Abs: 4.8 10*3/uL (ref 1.7–7.7)
Neutrophils Relative %: 74 %
Platelets: 256 10*3/uL (ref 150–400)
RBC: 4.41 MIL/uL (ref 4.22–5.81)
RDW: 12.1 % (ref 11.5–15.5)
WBC: 6.5 10*3/uL (ref 4.0–10.5)
nRBC: 0 % (ref 0.0–0.2)

## 2021-03-01 LAB — MAGNESIUM: Magnesium: 1.8 mg/dL (ref 1.7–2.4)

## 2021-03-01 LAB — COMPREHENSIVE METABOLIC PANEL
ALT: 106 U/L — ABNORMAL HIGH (ref 0–44)
AST: 54 U/L — ABNORMAL HIGH (ref 15–41)
Albumin: 3.3 g/dL — ABNORMAL LOW (ref 3.5–5.0)
Alkaline Phosphatase: 43 U/L (ref 38–126)
Anion gap: 9 (ref 5–15)
BUN: 16 mg/dL (ref 6–20)
CO2: 35 mmol/L — ABNORMAL HIGH (ref 22–32)
Calcium: 9.5 mg/dL (ref 8.9–10.3)
Chloride: 90 mmol/L — ABNORMAL LOW (ref 98–111)
Creatinine, Ser: 0.78 mg/dL (ref 0.61–1.24)
GFR, Estimated: >60 mL/min (ref >60)
Glucose, Bld: 108 mg/dL — ABNORMAL HIGH (ref 70–99)
Potassium: 4.2 mmol/L (ref 3.5–5.1)
Sodium: 134 mmol/L — ABNORMAL LOW (ref 135–145)
Total Bilirubin: 0.6 mg/dL (ref 0.3–1.2)
Total Protein: 6.1 g/dL — ABNORMAL LOW (ref 6.5–8.1)

## 2021-03-01 LAB — BRAIN NATRIURETIC PEPTIDE: B Natriuretic Peptide: 183.1 pg/mL — ABNORMAL HIGH (ref 0.0–100.0)

## 2021-03-01 MED ORDER — ENOXAPARIN SODIUM 80 MG/0.8ML IJ SOSY: 75.0000 mg | PREFILLED_SYRINGE | INTRAMUSCULAR | Status: DC

## 2021-03-01 MED ORDER — MAGNESIUM OXIDE -MG SUPPLEMENT 400 (240 MG) MG PO TABS
400.0000 mg | ORAL_TABLET | Freq: Every day | ORAL | Status: DC
  Administered 2021-03-01 – 2021-03-06 (×6): 400 mg via ORAL
  Filled 2021-03-01 (×6): qty 1

## 2021-03-01 MED ORDER — SODIUM CHLORIDE 1 G PO TABS
1.0000 g | ORAL_TABLET | Freq: Three times a day (TID) | ORAL | Status: AC
  Administered 2021-03-01: 1 g via ORAL
  Filled 2021-03-01: qty 1

## 2021-03-01 MED ORDER — MELATONIN 5 MG PO TABS
5.0000 mg | ORAL_TABLET | Freq: Every day | ORAL | Status: DC
  Administered 2021-03-01 – 2021-03-05 (×5): 5 mg via ORAL
  Filled 2021-03-01 (×6): qty 1

## 2021-03-01 MED ORDER — PROCHLORPERAZINE EDISYLATE 10 MG/2ML IJ SOLN
5.0000 mg | Freq: Four times a day (QID) | INTRAMUSCULAR | Status: DC | PRN
Start: 2021-03-01 — End: 2021-03-06

## 2021-03-01 MED ORDER — ALUM & MAG HYDROXIDE-SIMETH 200-200-20 MG/5ML PO SUSP: 30.0000 mL | ORAL | Status: DC | PRN

## 2021-03-01 MED ORDER — COLCHICINE 0.6 MG PO TABS
0.6000 mg | ORAL_TABLET | Freq: Two times a day (BID) | ORAL | Status: DC
  Administered 2021-03-01 – 2021-03-06 (×10): 0.6 mg via ORAL
  Filled 2021-03-01 (×10): qty 1

## 2021-03-01 MED ORDER — CARVEDILOL 12.5 MG PO TABS: 12.5000 mg | ORAL_TABLET | Freq: Two times a day (BID) | ORAL | Status: DC

## 2021-03-01 MED ORDER — CYANOCOBALAMIN 1000 MCG PO TABS: 1000.0000 ug | ORAL_TABLET | Freq: Every day | ORAL | Status: DC

## 2021-03-01 MED ORDER — ADULT MULTIVITAMIN W/MINERALS CH: 1.0000 | ORAL_TABLET | Freq: Every day | ORAL | Status: DC

## 2021-03-01 MED ORDER — LOPERAMIDE HCL 2 MG PO CAPS
2.0000 mg | ORAL_CAPSULE | Freq: Four times a day (QID) | ORAL | Status: DC | PRN
  Administered 2021-03-05: 2 mg via ORAL
  Filled 2021-03-01: qty 1

## 2021-03-01 MED ORDER — FLEET ENEMA 7-19 GM/118ML RE ENEM: 1.0000 | ENEMA | Freq: Once | RECTAL | Status: DC | PRN

## 2021-03-01 MED ORDER — CHLORDIAZEPOXIDE HCL 5 MG PO CAPS: 5.0000 mg | ORAL_CAPSULE | Freq: Two times a day (BID) | ORAL | Status: DC

## 2021-03-01 MED ORDER — PROPRANOLOL HCL 20 MG PO TABS
20.0000 mg | ORAL_TABLET | Freq: Three times a day (TID) | ORAL | Status: DC | PRN
  Administered 2021-03-02 – 2021-03-03 (×2): 20 mg via ORAL
  Filled 2021-03-01 (×2): qty 1

## 2021-03-01 MED ORDER — SENNOSIDES-DOCUSATE SODIUM 8.6-50 MG PO TABS
2.0000 | ORAL_TABLET | Freq: Every day | ORAL | Status: DC
  Administered 2021-03-04: 2 via ORAL
  Filled 2021-03-01 (×3): qty 2

## 2021-03-01 MED ORDER — FOLIC ACID 1 MG PO TABS
1.0000 mg | ORAL_TABLET | Freq: Every day | ORAL | Status: DC
  Administered 2021-03-02 – 2021-03-06 (×5): 1 mg via ORAL
  Filled 2021-03-01 (×5): qty 1

## 2021-03-01 MED ORDER — CLONIDINE HCL 0.1 MG PO TABS
0.1000 mg | ORAL_TABLET | Freq: Two times a day (BID) | ORAL | Status: DC
  Administered 2021-03-01 – 2021-03-06 (×10): 0.1 mg via ORAL
  Filled 2021-03-01 (×10): qty 1

## 2021-03-01 MED ORDER — ALBUTEROL SULFATE (2.5 MG/3ML) 0.083% IN NEBU: 2.5000 mg | INHALATION_SOLUTION | Freq: Four times a day (QID) | RESPIRATORY_TRACT | Status: DC | PRN

## 2021-03-01 MED ORDER — VITAMIN B-12 1000 MCG PO TABS
1000.0000 ug | ORAL_TABLET | Freq: Every day | ORAL | Status: DC
  Administered 2021-03-02 – 2021-03-06 (×5): 1000 ug via ORAL
  Filled 2021-03-01 (×5): qty 1

## 2021-03-01 MED ORDER — COLCHICINE 0.6 MG PO TABS: 0.6000 mg | ORAL_TABLET | Freq: Every day | ORAL | Status: DC

## 2021-03-01 MED ORDER — CHLORDIAZEPOXIDE HCL 5 MG PO CAPS
5.0000 mg | ORAL_CAPSULE | Freq: Three times a day (TID) | ORAL | Status: DC
  Administered 2021-03-01 – 2021-03-03 (×5): 5 mg via ORAL
  Filled 2021-03-01 (×5): qty 1

## 2021-03-01 MED ORDER — ADULT MULTIVITAMIN W/MINERALS CH
1.0000 | ORAL_TABLET | Freq: Every day | ORAL | Status: DC
  Administered 2021-03-02 – 2021-03-06 (×5): 1 via ORAL
  Filled 2021-03-01 (×5): qty 1

## 2021-03-01 MED ORDER — PROCHLORPERAZINE 25 MG RE SUPP: 12.5000 mg | Freq: Four times a day (QID) | RECTAL | Status: DC | PRN

## 2021-03-01 MED ORDER — FUROSEMIDE 40 MG PO TABS
60.0000 mg | ORAL_TABLET | Freq: Once | ORAL | Status: AC
  Administered 2021-03-01: 60 mg via ORAL
  Filled 2021-03-01: qty 1

## 2021-03-01 MED ORDER — LISINOPRIL 10 MG PO TABS: 5.0000 mg | ORAL_TABLET | Freq: Every day | ORAL | Status: DC

## 2021-03-01 MED ORDER — THIAMINE HCL 100 MG PO TABS: 100.0000 mg | ORAL_TABLET | Freq: Every day | ORAL | Status: DC

## 2021-03-01 MED ORDER — ENSURE ENLIVE PO LIQD
237.0000 mL | Freq: Two times a day (BID) | ORAL | Status: DC
  Administered 2021-03-02 – 2021-03-05 (×5): 237 mL via ORAL

## 2021-03-01 MED ORDER — FOLIC ACID 1 MG PO TABS: 1.0000 mg | ORAL_TABLET | Freq: Every day | ORAL | Status: DC

## 2021-03-01 MED ORDER — NICOTINE 21 MG/24HR TD PT24: 21.0000 mg | MEDICATED_PATCH | Freq: Every day | TRANSDERMAL | 0 refills | Status: DC

## 2021-03-01 MED ORDER — LISINOPRIL 5 MG PO TABS
5.0000 mg | ORAL_TABLET | Freq: Every day | ORAL | Status: DC
  Administered 2021-03-02 – 2021-03-06 (×5): 5 mg via ORAL
  Filled 2021-03-01 (×5): qty 1

## 2021-03-01 MED ORDER — ACETAMINOPHEN 325 MG PO TABS
325.0000 mg | ORAL_TABLET | ORAL | Status: DC | PRN
  Administered 2021-03-02 – 2021-03-05 (×4): 650 mg via ORAL
  Filled 2021-03-01 (×4): qty 2

## 2021-03-01 MED ORDER — DIPHENHYDRAMINE HCL 12.5 MG/5ML PO ELIX: 12.5000 mg | ORAL_SOLUTION | Freq: Four times a day (QID) | ORAL | Status: DC | PRN

## 2021-03-01 MED ORDER — POLYETHYLENE GLYCOL 3350 17 G PO PACK: 17.0000 g | PACK | Freq: Every day | ORAL | Status: DC | PRN

## 2021-03-01 MED ORDER — DICLOFENAC SODIUM 1 % EX GEL
2.0000 g | Freq: Four times a day (QID) | CUTANEOUS | Status: DC
  Administered 2021-03-01 – 2021-03-06 (×14): 2 g via TOPICAL
  Filled 2021-03-01: qty 100

## 2021-03-01 MED ORDER — THIAMINE HCL 100 MG PO TABS
100.0000 mg | ORAL_TABLET | Freq: Every day | ORAL | Status: DC
  Administered 2021-03-02 – 2021-03-06 (×5): 100 mg via ORAL
  Filled 2021-03-01 (×5): qty 1

## 2021-03-01 MED ORDER — PROCHLORPERAZINE MALEATE 5 MG PO TABS
5.0000 mg | ORAL_TABLET | Freq: Four times a day (QID) | ORAL | Status: DC | PRN
Start: 2021-03-01 — End: 2021-03-06

## 2021-03-01 MED ORDER — METHYLPREDNISOLONE SODIUM SUCC 40 MG IJ SOLR
25.0000 mg | INTRAMUSCULAR | Status: AC
  Administered 2021-03-02: 25 mg via INTRAVENOUS
  Filled 2021-03-01: qty 0.63

## 2021-03-01 MED ORDER — NICOTINE 21 MG/24HR TD PT24
21.0000 mg | MEDICATED_PATCH | Freq: Every day | TRANSDERMAL | Status: DC
  Administered 2021-03-02 – 2021-03-03 (×2): 21 mg via TRANSDERMAL
  Filled 2021-03-01 (×3): qty 1

## 2021-03-01 MED ORDER — METHYLPREDNISOLONE SODIUM SUCC 40 MG IJ SOLR: 25.0000 mg | INTRAMUSCULAR | Status: DC

## 2021-03-01 MED ORDER — CARVEDILOL 12.5 MG PO TABS
12.5000 mg | ORAL_TABLET | Freq: Two times a day (BID) | ORAL | Status: DC
  Administered 2021-03-01 – 2021-03-06 (×9): 12.5 mg via ORAL
  Filled 2021-03-01 (×10): qty 1

## 2021-03-01 MED ORDER — BISACODYL 10 MG RE SUPP: 10.0000 mg | Freq: Every day | RECTAL | Status: DC | PRN

## 2021-03-01 MED ORDER — ENOXAPARIN SODIUM 80 MG/0.8ML IJ SOSY
75.0000 mg | PREFILLED_SYRINGE | INTRAMUSCULAR | Status: DC
  Administered 2021-03-01 – 2021-03-05 (×5): 75 mg via SUBCUTANEOUS
  Filled 2021-03-01 (×5): qty 0.8

## 2021-03-01 MED ORDER — NYSTATIN 100000 UNIT/ML MT SUSP
5.0000 mL | Freq: Four times a day (QID) | OROMUCOSAL | Status: DC
  Administered 2021-03-01 – 2021-03-06 (×16): 500000 [IU] via OROMUCOSAL
  Filled 2021-03-01 (×16): qty 5

## 2021-03-01 MED ORDER — PANTOPRAZOLE SODIUM 40 MG PO TBEC
40.0000 mg | DELAYED_RELEASE_TABLET | Freq: Every day | ORAL | Status: DC
  Administered 2021-03-02 – 2021-03-06 (×5): 40 mg via ORAL
  Filled 2021-03-01 (×5): qty 1

## 2021-03-01 MED ORDER — GUAIFENESIN-DM 100-10 MG/5ML PO SYRP: 5.0000 mL | ORAL_SOLUTION | Freq: Four times a day (QID) | ORAL | Status: DC | PRN

## 2021-03-01 NOTE — H&P (Signed)
Physical Medicine and Rehabilitation Admission H&P        Chief Complaint  Patient presents with   Shortness of Breath      HPI: Colin Stevens is a 50 year old Rh-male with history of HTN, nicotine dependence, ETOH abuse who was admitted on 02/19/21 with 3 week history of progressive SOB with BLE edema and unsteady gait, increase in abdominal girth, PND, productive cough, tremors, waxing and waning of MS with confusion and recurrent falls. He was noted be  hypoxic with elevated BNP-698, hyponatremic- Na 128,  and had DTs due to inability to consume usual amount of ETOH. CXR showed mild pulmonary edema and he was treated with IV lasix briefly for diuresis. MRI brain done which was negative for acute changes. MRI thoracolumbar spine showed normal thoracic spine and mild non compressive disc bulges from L1-L4, L4/L5 disc bulge more prominent on left with mild lateral stenosis possibly causing compression on left L5 nerve.  Abdominal ultrasound showed non-specific GB thickening and heterogenous, coarsened increased hepatic parenchymal echogenicity c/w with steatosis and possible cirrhosis.    Vitamin B1- 132, Vitamin B12- 259 ammonia levels WNL. Ionized Ca-1.12. Vitamin E-6.4 (low). Folate low at 5.5. TSH-3.067--WNL. 2 D echo showed EF 60%. Acute hypoxic respiratory failure felt to be due to acute on chornic CHF and resolved with diuresis and addition of BB. He was treated with clonidine and CIWA protocol with improvement in mentation. Dr. Otelia Limes consulted and felt that patient with ETOH withdrawal syndrome with subacute thiamine deficiency. He was started on B 12 supplement as well as high dose IV thiamine X 3 days then transitioned to oral dose.   He was started on liquid diet and required IVF 10/26 due to dehydration.    He developed left arm pain with erythrema and edema felt to be due to IV infiltration/trauma after patient pulling out IV. Ortho/Dr. Blanchie Dessert consulted and  recommended X rays  to rule out Fx as well as changing IV to right forearm.  Left forearm and elbow X rays done which showed soft tissue swelling posterior to the elbow but was negative for fracture. He was treated with Keflex and edema improving with low suspicion of septic arthritis. He continued to have left elbow pain and gout questioned therefore colchicine added as well as IV solumedrol X 3 days which completes today.   He had recurrent drop in sodium with concerns of SIADH therefore treated with salt tabs, Samsca 10/28 as well as high dose lasix for past 3 days. Mentation improving with decrease in anxiety and hypertensive emergency resolved. He continues to have intermittent confusion, fear of falling, limited mobility with tremors/shakes with fatigue and fatigue with tremors and pain left elbow. CIR recommended due to functional decline.      Pt reports pain 0/10 currently- trying to have BM- having a little constipation.  Using condom catheter- gout flare is almost resolved.      Review of Systems  Respiratory:  Negative for cough and shortness of breath.   Cardiovascular:  Positive for leg swelling. Negative for chest pain and orthopnea.  Gastrointestinal:  Positive for constipation.  Musculoskeletal:  Positive for back pain and myalgias.  Neurological:  Positive for sensory change (BLE numbness/tingling for 3 weeks PTA).  All other systems reviewed and are negative.         Past Medical History:  Diagnosis Date   Essential hypertension 02/19/2021   Nicotine dependence, cigarettes, uncomplicated 02/19/2021  Past Surgical History:  Procedure Laterality Date   TONSILLECTOMY               Family History  Problem Relation Age of Onset   Heart disease Father     Cancer Maternal Grandfather        Social History:  Married. Used to work for [employer] due to back injury. He  reports that he has been smoking cigarettes. He has been smoking an average of 1 pack per day. He  has never used smokeless tobacco. He reports current alcohol use of about 77.0 standard drinks per week. He reports that he does not use drugs.         Allergies  Allergen Reactions   Aleve [Naproxen] Rash              Medications Prior to Admission  Medication Sig Dispense Refill   amLODipine (NORVASC) 5 MG tablet Take 5 mg by mouth at bedtime.       Calcium Carbonate Antacid (TUMS ULTRA 1000 PO) Take 1,000 mg by mouth 3 (three) times daily as needed (acid reflux).       diphenhydrAMINE HCl, Sleep, (SLEEP AID) 50 MG CAPS Take 50 mg by mouth at bedtime as needed (sleep).       ibuprofen (ADVIL) 200 MG tablet Take 600 mg by mouth 2 (two) times daily as needed (back pain).       lisinopril (ZESTRIL) 20 MG tablet Take 20 mg by mouth every morning.       melatonin 5 MG TABS Take 5 mg by mouth at bedtime.       Naphazoline-Glycerin (REDNESS RELIEF OP) Place 1 drop into both eyes daily as needed (redness/irritation/dry eyes).       omeprazole (PRILOSEC OTC) 20 MG tablet Take 20 mg by mouth every morning.       propranolol (INDERAL) 20 MG tablet Take 20 mg by mouth 3 (three) times daily as needed (anxiety).       Pseudoephedrine HCl (SUDAFED 12 HOUR PO) Take 2 tablets by mouth 2 (two) times daily as needed (congestion).          Drug Regimen Review  Drug regimen was reviewed and remains appropriate with no significant issues identified   Home: Home Living Family/patient expects to be discharged to:: Private residence Living Arrangements: Spouse/significant other Available Help at Discharge: Family, Available 24 hours/day (Wife works from home) Type of Home: House Home Access: Stairs to enter Secretary/administrator of Steps: 4 Entrance Stairs-Rails: Right, Left Home Layout: One level Bathroom Shower/Tub: Associate Professor: Yes Home Equipment: None  Lives With: Spouse   Functional History: Prior Function Prior Level of Function :  Independent/Modified Independent Mobility Comments: no use of AD ADLs Comments: worked as Chartered certified accountant, Independent with ADLs/IADLs (Read Only) Level of Independence: Independent (Read Only) Comments: Doesn't work. Recent falls   Functional Status:  Mobility: Bed Mobility Overal bed mobility: Needs Assistance Bed Mobility: Supine to Sit Supine to sit: Min guard, HOB elevated Sit to supine: Mod assist General bed mobility comments: +rail, increased time Transfers Overall transfer level: Needs assistance Equipment used: Rolling walker (2 wheels) Transfer via Lift Equipment: Stedy Transfers: Sit to/from Stand Sit to Stand: Min assist, +2 safety/equipment, +2 physical assistance Stand pivot transfers: Min assist General transfer comment: initially +2 min to power up to stand. Progressed to min assist +2 safety, cues for sequencing Ambulation/Gait Ambulation/Gait assistance: +2 safety/equipment, +2 physical assistance, Min assist Gait  Distance (Feet): 100 Feet (+ 25') Assistive device: Rolling walker (2 wheels) Gait Pattern/deviations: Step-through pattern, Decreased stride length General Gait Details: slow, guard gait; trembling/shaking noted. Initially ambulated in room 25' with RW +2 min assist. This helped build pt's confidence for gait trial in hallway. Pt able to amb in hall 100' min assist with RW, +2 safety/close chair follow Gait velocity: decreased Gait velocity interpretation: <1.8 ft/sec, indicate of risk for recurrent falls Pre-gait activities: Tolerated 3x 15-20-sec bouts of standing in stedy with BUE support on stedy bar   ADL: ADL Overall ADL's : Needs assistance/impaired Eating/Feeding: Set up, Bed level Grooming: Bed level, Supervision/safety Upper Body Bathing: Minimal assistance, Sitting Lower Body Bathing: Maximal assistance, +2 for physical assistance, +2 for safety/equipment, Sit to/from stand Upper Body Dressing : Minimal assistance, Sitting Upper Body Dressing  Details (indicate cue type and reason): donned gown seated on eob Lower Body Dressing: Maximal assistance, +2 for physical assistance, +2 for safety/equipment, Sit to/from stand Toileting- Clothing Manipulation and Hygiene: Total assistance, Bed level Functional mobility during ADLs: Minimal assistance, Rolling walker (2 wheels) General ADL Comments: demonstrated decreased fear of falling   Cognition: Cognition Overall Cognitive Status: Impaired/Different from baseline Orientation Level: Oriented X4 Cognition Arousal/Alertness: Awake/alert Behavior During Therapy: Anxious, Flat affect Overall Cognitive Status: Impaired/Different from baseline Area of Impairment: Orientation, Attention, Memory, Following commands, Safety/judgement, Awareness, Problem solving Orientation Level: Disoriented to, Time Current Attention Level: Sustained Memory: Decreased short-term memory Following Commands: Follows one step commands with increased time Safety/Judgement: Decreased awareness of safety, Decreased awareness of deficits Awareness: Emergent Problem Solving: Slow processing, Requires verbal cues, Difficulty sequencing General Comments: followed commands and less fear of falling     Blood pressure (!) 151/96, pulse 92, temperature 97.8 F (36.6 C), temperature source Oral, resp. rate 18, height 6\' 4"  (1.93 m), weight (!) 141 kg, SpO2 90 %. Physical Exam Vitals and nursing note reviewed. Exam conducted with a chaperone present.  Constitutional:      Appearance: Normal appearance. He is well-developed. He is obese.     Comments: Pt sitting on BSC- wife at bedside; BMI 37; has condom catheter- NAD  HENT:     Head: Normocephalic and atraumatic.     Comments: Smile equal; EOM intact B/L     Right Ear: External ear normal.     Left Ear: External ear normal.     Nose: Nose normal. No congestion.     Mouth/Throat:     Mouth: Mucous membranes are dry.     Pharynx: Oropharynx is clear. No  oropharyngeal exudate.  Eyes:     General:        Right eye: No discharge.        Left eye: No discharge.     Extraocular Movements: Extraocular movements intact.  Cardiovascular:     Rate and Rhythm: Regular rhythm. Tachycardia present.     Heart sounds: Normal heart sounds. No murmur heard.   No gallop.  Pulmonary:     Comments: CTA B/L- no W/R/R- good air movement     Abdominal:     Comments: Soft, NT, ND, (+)BS ; protuberant     Genitourinary:    Comments: Condom catheter- light amber urine Musculoskeletal:     Cervical back: Normal range of motion. No rigidity.     Comments: MS- 5/5 in UE B/L LE's 5-/5 in LE's B/L  L elbow- trace redness; no warmth- no swelling  Skin:    Comments: Left forearm with scab and erythema.  Stasis changes BLE.  Couple of scabs- IV L forearm- looks OK  Neurological:     Mental Status: He is alert.     Comments: No axterixis B/L No flapping Intact to light touch B/L x 4 extremities  Psychiatric:     Comments: Appropriate, slightly flat      Lab Results Last 48 Hours        Results for orders placed or performed during the hospital encounter of 02/19/21 (from the past 48 hour(s))  Magnesium     Status: None    Collection Time: 02/28/21  1:18 AM  Result Value Ref Range    Magnesium 1.8 1.7 - 2.4 mg/dL      Comment: Performed at [facility], [address], [place], [place] [address]  Comprehensive metabolic panel     Status: Abnormal    Collection Time: 02/28/21  1:18 AM  Result Value Ref Range    Sodium 132 (L) 135 - 145 mmol/L    Potassium 4.0 3.5 - 5.1 mmol/L    Chloride 93 (L) 98 - 111 mmol/L    CO2 32 22 - 32 mmol/L    Glucose, Bld 119 (H) 70 - 99 mg/dL      Comment: Glucose reference range applies only to samples taken after fasting for at least 8 hours.    BUN 16 6 - 20 mg/dL    Creatinine, Ser 2.72 0.61 - 1.24 mg/dL    Calcium 9.4 8.9 - 53.6 mg/dL    Total Protein 6.2 (L) 6.5 - 8.1 g/dL    Albumin 3.2 (L) 3.5 - 5.0  g/dL    AST 66 (H) 15 - 41 U/L    ALT 88 (H) 0 - 44 U/L    Alkaline Phosphatase 44 38 - 126 U/L    Total Bilirubin 0.7 0.3 - 1.2 mg/dL    GFR, Estimated >64 >40 mL/min      Comment: (NOTE) Calculated using the CKD-EPI Creatinine Equation (2021)      Anion gap 7 5 - 15      Comment: Performed at [facility], [address] [place], [place] [address]  CBC with Differential/Platelet     Status: Abnormal    Collection Time: 02/28/21  1:18 AM  Result Value Ref Range    WBC 7.0 4.0 - 10.5 K/uL    RBC 4.17 (L) 4.22 - 5.81 MIL/uL    Hemoglobin 14.3 13.0 - 17.0 g/dL    HCT 59.5 63.8 - 75.6 %    MCV 102.4 (H) 80.0 - 100.0 fL    MCH 34.3 (H) 26.0 - 34.0 pg    MCHC 33.5 30.0 - 36.0 g/dL    RDW 43.3 29.5 - 18.8 %    Platelets 240 150 - 400 K/uL    nRBC 0.0 0.0 - 0.2 %    Neutrophils Relative % 84 %    Neutro Abs 5.8 1.7 - 7.7 K/uL    Lymphocytes Relative 7 %    Lymphs Abs 0.5 (L) 0.7 - 4.0 K/uL    Monocytes Relative 9 %    Monocytes Absolute 0.6 0.1 - 1.0 K/uL    Eosinophils Relative 0 %    Eosinophils Absolute 0.0 0.0 - 0.5 K/uL    Basophils Relative 0 %    Basophils Absolute 0.0 0.0 - 0.1 K/uL    Immature Granulocytes 0 %    Abs Immature Granulocytes 0.02 0.00 - 0.07 K/uL      Comment: Performed  at [facility], [address] [place], [place] [address]  Brain natriuretic peptide     Status: Abnormal    Collection Time: 02/28/21  1:18 AM  Result Value Ref Range    B Natriuretic Peptide 497.3 (H) 0.0 - 100.0 pg/mL      Comment: Performed at [facility], [address] [place], [place] [address]  Magnesium     Status: None    Collection Time: 03/01/21  1:59 AM  Result Value Ref Range    Magnesium 1.8 1.7 - 2.4 mg/dL      Comment: Performed at [facility], [address], [place], [place] [address]  Comprehensive metabolic panel     Status: Abnormal    Collection Time: 03/01/21  1:59 AM  Result Value Ref Range    Sodium 134 (L) 135 - 145 mmol/L     Potassium 4.2 3.5 - 5.1 mmol/L    Chloride 90 (L) 98 - 111 mmol/L    CO2 35 (H) 22 - 32 mmol/L    Glucose, Bld 108 (H) 70 - 99 mg/dL      Comment: Glucose reference range applies only to samples taken after fasting for at least 8 hours.    BUN 16 6 - 20 mg/dL    Creatinine, Ser 8.46 0.61 - 1.24 mg/dL    Calcium 9.5 8.9 - 96.2 mg/dL    Total Protein 6.1 (L) 6.5 - 8.1 g/dL    Albumin 3.3 (L) 3.5 - 5.0 g/dL    AST 54 (H) 15 - 41 U/L    ALT 106 (H) 0 - 44 U/L    Alkaline Phosphatase 43 38 - 126 U/L    Total Bilirubin 0.6 0.3 - 1.2 mg/dL    GFR, Estimated >95 >28 mL/min      Comment: (NOTE) Calculated using the CKD-EPI Creatinine Equation (2021)      Anion gap 9 5 - 15      Comment: Performed at [facility], [address] [place], [place] [address]  CBC with Differential/Platelet     Status: Abnormal    Collection Time: 03/01/21  1:59 AM  Result Value Ref Range    WBC 6.5 4.0 - 10.5 K/uL    RBC 4.41 4.22 - 5.81 MIL/uL    Hemoglobin 15.0 13.0 - 17.0 g/dL    HCT 40.1 02.7 - 25.3 %    MCV 102.5 (H) 80.0 - 100.0 fL    MCH 34.0 26.0 - 34.0 pg    MCHC 33.2 30.0 - 36.0 g/dL    RDW 66.4 40.3 - 47.4 %    Platelets 256 150 - 400 K/uL    nRBC 0.0 0.0 - 0.2 %    Neutrophils Relative % 74 %    Neutro Abs 4.8 1.7 - 7.7 K/uL    Lymphocytes Relative 14 %    Lymphs Abs 0.9 0.7 - 4.0 K/uL    Monocytes Relative 11 %    Monocytes Absolute 0.7 0.1 - 1.0 K/uL    Eosinophils Relative 1 %    Eosinophils Absolute 0.1 0.0 - 0.5 K/uL    Basophils Relative 0 %    Basophils Absolute 0.0 0.0 - 0.1 K/uL    Immature Granulocytes 0 %    Abs Immature Granulocytes 0.01 0.00 - 0.07 K/uL      Comment: Performed at [facility], [address] [place], [place] [address]  Brain natriuretic peptide  Status: Abnormal    Collection Time: 03/01/21  1:59 AM  Result Value Ref Range    B Natriuretic Peptide 183.1 (H) 0.0 - 100.0 pg/mL      Comment: Performed at [facility], [address] [place], [place] [address]      Imaging Results (Last 48 hours)  No results found.           Medical Problem List and Plan: 1.  Debility secondary to complicated medical issues including CHF exacerbation and newly dx'd cirrhosis             -patient may  shower             -ELOS/Goals: 10-14 days- supervision to min A 2.  Antithrombotics: -DVT/anticoagulation:  Pharmaceutical: Lovenox             -antiplatelet therapy: N/A 3. Pain Management: Tylenol prn.  4. Mood: LCSW to follow for evaluation and support. Used propranolol tid prn for anxiety-->will resume.              -antipsychotic agents: N/A 5. Neuropsych: This patient is not fully capable of making decisions on his own behalf. 6. Skin/Wound Care: Routine pressure relief measures.              --monitor elbow for any changes.  7. Fluids/Electrolytes/Nutrition: Monitor I/O. Check lytes in am. 8. ETOH abuse/DT: Librium being slowly tapered--continue at 5 mg TID             --Continue thiamine 100 mg daily and cyanocobalamin 1000 mg daily             --Folic acid 1 mg daily 9. HTN: Monitor BP tid--on Zestril. Resume low dose catapres bid--question reflex tachycardia.  10. Left elbow erythema/pain/edema: Completed 4 day course of Keflex per ortho             --On colchicine bid with solumedrol taper -->completes in am.   11.  Hyponatremia: Has improved from 128-->134. Continue salt tabs.             --continue 1500 cc FR.  12. Hypomagnesemia: Improved after supplement. --Recheck level in am. Will add oral supplement.  13. Constipation: Will start Senna S- needs to get cleaned out, possibly- might need sorbitol?         I have personally performed a face to face diagnostic evaluation of this patient and formulated the key components of the plan.  Additionally, I have personally reviewed laboratory data, imaging studies, as well as relevant notes and concur with the physician assistant's documentation above.   The patient's  status has not changed from the original H&P.  Any changes in documentation from the acute care chart have been noted above.       Jacquelynn Cree, PA-C 03/01/2021

## 2021-03-01 NOTE — Discharge Summary (Signed)
Colin Stevens WIO:[id number] DOB: [birth year] DOA: 02/19/2021  PCP: Clayborn Heron, MD  Admit date: 02/19/2021  Discharge date: 03/01/2021  Admitted From: Home   Disposition:  CIR   Recommendations for Outpatient Follow-up:   Follow up with PCP in 1-2 weeks  PCP Please obtain BMP/CBC, 2 view CXR in 1week,  (see Discharge instructions)   PCP Please follow up on the following pending results:    Home Health: None   Equipment/Devices: None  Consultations: Ortho Discharge Condition: Stable    CODE STATUS: Full    Diet Recommendation: Heart Healthy with 1.5 lit/day fluid restriction    Chief Complaint  Patient presents with   Shortness of Breath     Brief history of present illness from the day of admission and additional interim summary    50 year old male with past medical history of hypertension, nicotine dependence, alcohol abuse who presents to [facility] emergency department via EMS with shortness of breath and DTs.                                                                 Hospital Course   Acute Hypoxic Resp. Failure due to Acute on Chronic CHF - echocardiogram with EF 60% likely had mild acute on chronic diastolic CHF which has resolved post IV Lasix, continue beta-blocker.  Monitor closely, use Lasix PRN.    2.  DTS -  was on Librium + CIWA protocol, DTs resolved, placed on B12, MVI, thiamine and folic acid , was seen by neuro. MRI brain unremarkable.     3.  Hypertensive urgency.  Resolved on ACE + Coreg, continue to monitor at CIR.   4.  Fall at home.  MRI brain along with spine unremarkable.  Mild L5 left-sided nerve compression which will be monitored, PT OT >> CIR vs HHPT.   5. Ongoing nicotine dependence and alcohol abuse.  Counseled to quit.   6.  Obesity.  BMI of 40.   Follow with PCP for weight loss.   7.  Hypomagnesemia.  Replaced.   8.  Ultrasound suggestive of cirrhosis with thrombocytopenia on ultrasound.  Likely alcoholic cirrhosis, thrombocytopenia likely due to it.  Negative acute hepatitis panel. Outpt GI folow up.   9.  Hyponatremia.  SIADH, improved after Samsca 1 dose, continue Lasix PRN & fluid restriction.   10.  Acute Gout -seen by orthopedics much improved after steroids and colchicine, steroids tapered off, almost completely resolved.  Discharge diagnosis     Principal Problem:   Acute respiratory failure with hypoxia [facility]) Active Problems:   Alcohol withdrawal delirium, acute, hypoactive (HCC)   Hypertensive urgency   Nicotine dependence, cigarettes, uncomplicated   Fall at home, initial encounter   Elevated troponin level not due myocardial infarction    Discharge instructions  Discharge Instructions     Discharge instructions   Complete by: As directed    Get CBC, CMP, 2 view Chest X ray -  checked next visit within 2-3 days by CIR MD   Activity: As tolerated with Full fall precautions use walker/cane & assistance as needed  Disposition CIR  Diet: Heart Healthy with 1.5 lit/day fluid restriction  Special Instructions: If you have smoked or chewed Tobacco  in the last 2 yrs please stop smoking, stop any regular Alcohol  and or any Recreational drug use.  On your next visit with your primary care physician please Get Medicines reviewed and adjusted.  Please request your Prim.MD to go over all Hospital Tests and Procedure/Radiological results at the follow up, please get all Hospital records sent to your Prim MD by signing hospital release before you go home.  If you experience worsening of your admission symptoms, develop shortness of breath, life threatening emergency, suicidal or homicidal thoughts you must seek medical attention immediately by calling 911 or calling your MD immediately  if symptoms less  severe.  You Must read complete instructions/literature along with all the possible adverse reactions/side effects for all the Medicines you take and that have been prescribed to you. Take any new Medicines after you have completely understood and accpet all the possible adverse reactions/side effects.   Increase activity slowly   Complete by: As directed        Discharge Medications   Allergies as of 03/01/2021       Reactions   Aleve [naproxen] Rash        Medication List     STOP taking these medications    amLODipine 5 MG tablet Commonly known as: NORVASC   ibuprofen 200 MG tablet Commonly known as: ADVIL   propranolol 20 MG tablet Commonly known as: INDERAL   Sleep Aid 50 MG Caps Generic drug: diphenhydrAMINE HCl (Sleep)   SUDAFED 12 HOUR PO       TAKE these medications    carvedilol 12.5 MG tablet Commonly known as: COREG Take 1 tablet (12.5 mg total) by mouth 2 (two) times daily with a meal.   colchicine 0.6 MG tablet Take 1 tablet (0.6 mg total) by mouth daily.   cyanocobalamin 1000 MCG tablet Take 1 tablet (1,000 mcg total) by mouth daily. Start taking on: March 02, 2021   folic acid 1 MG tablet Commonly known as: FOLVITE Take 1 tablet (1 mg total) by mouth daily. Start taking on: March 02, 2021   lisinopril 10 MG tablet Commonly known as: ZESTRIL Take 0.5 tablets (5 mg total) by mouth daily. Start taking on: March 02, 2021 What changed:  medication strength how much to take when to take this   melatonin 5 MG Tabs Take 5 mg by mouth at bedtime.   multivitamin with minerals Tabs tablet Take 1 tablet by mouth daily. Start taking on: March 02, 2021   nicotine 21 mg/24hr patch Commonly known as: NICODERM CQ - dosed in mg/24 hours Place 1 patch (21 mg total) onto the skin daily. Start taking on: March 02, 2021   omeprazole 20 MG tablet Commonly known as: PRILOSEC OTC Take 20 mg by mouth every morning.   REDNESS RELIEF  OP Place 1 drop into both eyes daily as needed (redness/irritation/dry eyes).   thiamine 100 MG tablet Take 1 tablet (100 mg total) by mouth daily. Start taking on: March 02, 2021   TUMS ULTRA 1000 PO Take 1,000 mg by mouth 3 (  three) times daily as needed (acid reflux).         Follow-up Information     Rankins, Fanny Dance, MD. Schedule an appointment as soon as possible for a visit in 1 week(s).   Specialty: Family Medicine Contact information: [address] [place] [address] [phone]                 Major procedures and Radiology Reports - PLEASE review detailed and final reports thoroughly  -        DG Chest 2 View  Result Date: 02/19/2021 CLINICAL DATA:  50 year old male with shortness of breath. EXAM: CHEST - 2 VIEW COMPARISON:  03/04/2014 FINDINGS: The mediastinal contours are within normal limits. Mild cardiomegaly. Low lung volumes. My cephalization of pulmonary vasculature. Mild diffuse hazy pulmonary opacities with a bibasilar predominance. No pneumothorax or evidence of significant pleural effusion. No acute osseous abnormality. IMPRESSION: Mild pulmonary edema. Electronically Signed   By: Marliss Coots M.D.   On: 02/19/2021 13:59   DG ELBOW COMPLETE LEFT (3+VIEW)  Result Date: 02/24/2021 CLINICAL DATA:  LEFT posterior elbow pain EXAM: LEFT ELBOW - COMPLETE 3+ VIEW COMPARISON:  None. FINDINGS: No evidence of fracture of the ulna or humerus. The radial head is normal. No joint effusion. IMPRESSION: No fracture or dislocation. Electronically Signed   By: Genevive Bi M.D.   On: 02/24/2021 09:06   DG Forearm Left  Result Date: 02/24/2021 CLINICAL DATA:  Fall. EXAM: LEFT FOREARM - 2 VIEW COMPARISON:  Left elbow x-ray same day. FINDINGS: There is no acute fracture or dislocation. Joint spaces are maintained. Small enthesophyte is again noted from the anterior distal humerus. There is soft tissue swelling posterior to the elbow. IV is noted in  the anterior forearm. IMPRESSION: 1. No acute bony abnormality. Electronically Signed   By: Darliss Cheney M.D.   On: 02/24/2021 16:13   CT Head Wo Contrast  Result Date: 02/19/2021 CLINICAL DATA:  Neuro deficit, acute, stroke suspected EXAM: CT HEAD WITHOUT CONTRAST TECHNIQUE: Contiguous axial images were obtained from the base of the skull through the vertex without intravenous contrast. COMPARISON:  None. FINDINGS: Brain: No evidence of acute infarction, hemorrhage, hydrocephalus, extra-axial collection or mass lesion/mass effect. Vascular: No hyperdense vessel or unexpected calcification. Skull: Normal. Negative for fracture or focal lesion. Sinuses/Orbits: Mild mucosal thickening of the LEFT greater than RIGHT maxillary sinuses. Other: None. IMPRESSION: No acute intracranial abnormality. Electronically Signed   By: Meda Klinefelter M.D.   On: 02/19/2021 14:57   MR BRAIN WO CONTRAST  Result Date: 02/19/2021 CLINICAL DATA:  Neuro deficit, acute, stroke suspected. Generalized weakness and dizziness. EXAM: MRI HEAD WITHOUT CONTRAST TECHNIQUE: Multiplanar, multiecho pulse sequences of the brain and surrounding structures were obtained without intravenous contrast. COMPARISON:  Head CT same day FINDINGS: Brain: The brain has a normal appearance without evidence of malformation, atrophy, old or acute small or large vessel infarction, mass lesion, hemorrhage, hydrocephalus or extra-axial collection. Vascular: Major vessels at the base of the brain show flow. Venous sinuses appear patent. Skull and upper cervical spine: Normal. Sinuses/Orbits: Clear/normal. Other: None significant. IMPRESSION: Normal MRI of the brain. Electronically Signed   By: Paulina Fusi M.D.   On: 02/19/2021 18:23   MR THORACIC SPINE WO CONTRAST  Result Date: 02/19/2021 CLINICAL DATA:  Mid back pain.  Weakness and dizziness. EXAM: MRI THORACIC SPINE WITHOUT CONTRAST TECHNIQUE: Multiplanar, multisequence MR imaging of the thoracic  spine was performed. No intravenous contrast was administered. COMPARISON:  None.  FINDINGS: Alignment:  Normal Vertebrae: Normal Cord: Thoracic cord is normal. No focal lesion. No cord compression. Paraspinal and other soft tissues: Normal Disc levels: No evidence of disc degeneration, bulge or herniation. No facet arthropathy. Insignificant superior endplate Schmorl's node at T9 without edema. IMPRESSION: Normal MRI of the thoracic spine. No cord abnormality. No abnormality seen to explain regional pain. Electronically Signed   By: Paulina Fusi M.D.   On: 02/19/2021 18:25   MR LUMBAR SPINE WO CONTRAST  Result Date: 02/19/2021 CLINICAL DATA:  Low back pain. Cauda equina syndrome. Generalized weakness. EXAM: MRI LUMBAR SPINE WITHOUT CONTRAST TECHNIQUE: Multiplanar, multisequence MR imaging of the lumbar spine was performed. No intravenous contrast was administered. COMPARISON:  None. FINDINGS: Segmentation:  5 lumbar type vertebral bodies assumed. Alignment:  Mild curvature convex to the left with the apex at L3. Vertebrae:  No fracture or focal bone lesion. Conus medullaris and cauda equina: Conus extends to the L1 level. Conus and cauda equina appear normal. Paraspinal and other soft tissues: Negative Disc levels: L1-2: Mild noncompressive disc bulge. L2-3: Mild noncompressive disc bulge. L3-4: Mild noncompressive disc bulge. L4-5: Mild bulging of the disc. Mild facet and ligamentous hypertrophy. Mild narrowing of the lateral recesses left more than right. Some potential the left L5 nerve could be affected in this location. L5-S1: Normal interspace. IMPRESSION: Mild non-compressive disc bulges at L1-2, L2-3 and L3-4. L4-5 disc bulge more prominent towards the left. Mild facet and ligamentous hypertrophy. Mild stenosis of the left lateral recess at this level that could possibly cause compression of the left L5 nerve. Electronically Signed   By: Paulina Fusi M.D.   On: 02/19/2021 18:27   DG Chest Port 1  View  Result Date: 02/27/2021 CLINICAL DATA:  Acute respiratory failure with hypoxia. EXAM: PORTABLE CHEST 1 VIEW COMPARISON:  February 24, 2021. FINDINGS: Stable cardiomegaly. No pneumothorax or pleural effusion is noted. Stable chronic interstitial densities as described above. No definite acute abnormality is noted. Bony thorax is unremarkable. IMPRESSION: No acute cardiopulmonary abnormality seen. Electronically Signed   By: Lupita Raider M.D.   On: 02/27/2021 09:25   DG Chest Port 1 View  Result Date: 02/24/2021 CLINICAL DATA:  Shortness of breath. EXAM: PORTABLE CHEST 1 VIEW COMPARISON:  02/21/2021 FINDINGS: 0732 hours. Low volume film. The cardio pericardial silhouette is enlarged. Interstitial markings are diffusely coarsened with chronic features. The lungs are clear without focal pneumonia, edema, pneumothorax or pleural effusion. The visualized bony structures of the thorax show no acute abnormality. Telemetry leads overlie the chest. IMPRESSION: Low volume film with chronic interstitial coarsening. Electronically Signed   By: Kennith Center M.D.   On: 02/24/2021 07:50   DG Chest Port 1 View  Result Date: 02/21/2021 CLINICAL DATA:  Shortness of breath. EXAM: PORTABLE CHEST 1 VIEW COMPARISON:  February 19, 2021. FINDINGS: The heart size and mediastinal contours are within normal limits. Minimal bibasilar subsegmental atelectasis or possibly edema is noted which is slightly improved compared to prior exam. The visualized skeletal structures are unremarkable. IMPRESSION: Slightly improved bibasilar opacities as described above. Electronically Signed   By: Lupita Raider M.D.   On: 02/21/2021 08:59   ECHOCARDIOGRAM COMPLETE  Result Date: 02/20/2021    ECHOCARDIOGRAM REPORT   Patient Name:   Colin Stevens Date of Exam: 02/20/2021 Medical Rec #:  [id number]      Height:       76.0 in Accession #:    [id number]     Weight:  335.3 lb Date of Birth:  [birth year]      BSA:          2.759 m  Patient Age:    50 years       BP:           153/130 mmHg Patient Gender: M              HR:           111 bpm. Exam Location:  Inpatient Procedure: 2D Echo Indications:    acute systolic chf  History:        Patient has no prior history of Echocardiogram examinations.                 Risk Factors:Current Smoker, Alcohol use and Hypertension.  Sonographer:    Delcie Roch RDCS Referring Phys: [id number] Deno Lunger SHALHOUB IMPRESSIONS  1. Left ventricular ejection fraction, by estimation, is 60 to 65%. The left ventricle has normal function. The left ventricle has no regional wall motion abnormalities. Left ventricular diastolic function could not be evaluated.  2. Right ventricular systolic function is normal. The right ventricular size is normal. Tricuspid regurgitation signal is inadequate for assessing PA pressure.  3. The mitral valve is grossly normal. Trivial mitral valve regurgitation. No evidence of mitral stenosis.  4. The aortic valve was not well visualized. There is mild calcification of the aortic valve. Aortic valve regurgitation is not visualized. Mild aortic valve sclerosis is present, with no evidence of aortic valve stenosis. Comparison(s): No prior Echocardiogram. Conclusion(s)/Recommendation(s): Normal biventricular function without evidence of hemodynamically significant valvular heart disease. FINDINGS  Left Ventricle: Left ventricular ejection fraction, by estimation, is 60 to 65%. The left ventricle has normal function. The left ventricle has no regional wall motion abnormalities. The left ventricular internal cavity size was normal in size. There is  no left ventricular hypertrophy. Left ventricular diastolic function could not be evaluated. Right Ventricle: The right ventricular size is normal. Right vetricular wall thickness was not well visualized. Right ventricular systolic function is normal. Tricuspid regurgitation signal is inadequate for assessing PA pressure. Left Atrium: Left  atrial size was normal in size. Right Atrium: Right atrial size was normal in size. Pericardium: There is no evidence of pericardial effusion. Presence of pericardial fat pad. Mitral Valve: The mitral valve is grossly normal. Trivial mitral valve regurgitation. No evidence of mitral valve stenosis. Tricuspid Valve: The tricuspid valve is grossly normal. Tricuspid valve regurgitation is trivial. No evidence of tricuspid stenosis. Aortic Valve: The aortic valve was not well visualized. There is mild calcification of the aortic valve. Aortic valve regurgitation is not visualized. Mild aortic valve sclerosis is present, with no evidence of aortic valve stenosis. Pulmonic Valve: The pulmonic valve was not well visualized. Pulmonic valve regurgitation is not visualized. Aorta: The aortic root and ascending aorta are structurally normal, with no evidence of dilitation. Venous: The inferior vena cava was not well visualized. IAS/Shunts: The interatrial septum was not well visualized.  LEFT VENTRICLE PLAX 2D LVIDd:         6.00 cm LVIDs:         3.80 cm LV PW:         1.10 cm LV IVS:        1.10 cm LVOT diam:     2.80 cm LV SV:         101 LV SV Index:   37 LVOT Area:     6.16 cm  RIGHT VENTRICLE RV S  prime:     15.80 cm/s TAPSE (M-mode): 2.4 cm LEFT ATRIUM           Index        RIGHT ATRIUM           Index LA diam:      4.40 cm 1.59 cm/m   RA Area:     19.00 cm LA Vol (A4C): 71.1 ml 25.77 ml/m  RA Volume:   51.10 ml  18.52 ml/m  AORTIC VALVE LVOT Vmax:   103.00 cm/s LVOT Vmean:  67.700 cm/s LVOT VTI:    0.164 m  AORTA Ao Root diam: 3.60 cm  SHUNTS Systemic VTI:  0.16 m Systemic Diam: 2.80 cm Jodelle Red MD Electronically signed by Jodelle Red MD Signature Date/Time: 02/20/2021/1:32:52 PM    Final    US Abdomen Limited RUQ (LIVER/GB)  Result Date: 02/19/2021 CLINICAL DATA:  Cirrhosis. EXAM: ULTRASOUND ABDOMEN LIMITED RIGHT UPPER QUADRANT COMPARISON:  None. FINDINGS: Gallbladder: Partially  distended. No visualized gallstones. Diffuse gallbladder wall thickening at 6 mm. No sonographic Murphy sign noted by sonographer. Common bile duct: Diameter: 5 mm, normal. Liver: Heterogeneous, coarsened, and diffusely increased in parenchymal echogenicity. The liver parenchyma is difficult to penetrate. Allowing for this, no focal lesion is seen. There may be a subtle capsular nodularity. Portal vein is patent on color Doppler imaging with normal direction of blood flow towards the liver. Other: No right upper quadrant ascites. IMPRESSION: 1. Heterogeneous, coarsened, and increased hepatic parenchymal echogenicity. There may be mild capsular nodularity. Findings consistent with steatosis and possible cirrhosis. 2. Liver parenchyma is difficult to penetrate, no focal lesion is seen. 3. Mild gallbladder wall thickening is nonspecific in the setting of chronic liver disease. No gallstones. No biliary dilatation. Electronically Signed   By: Narda Rutherford M.D.   On: 02/19/2021 21:05      Today   Subjective    Colin Stevens today has no headache,no chest abdominal pain,no new weakness tingling or numbness, feels much better    Objective   Blood pressure 150/85, pulse 92, temperature 97.8 F (36.6 C), temperature source Oral, resp. rate 18, height  (1.93 m), weight (!) 141 kg, SpO2 90 %.   Intake/Output Summary (Last 24 hours) at 03/01/2021 1327 Last data filed at 03/01/2021 1025 Gross per 24 hour  Intake 780 ml  Output 2000 ml  Net -1220 ml    Exam  Awake Alert, No new F.N deficits, Normal affect [place].AT,PERRAL Supple Neck,No JVD, No cervical lymphadenopathy appriciated.  Symmetrical Chest wall movement, Good air movement bilaterally, CTAB RRR,No Gallops,Rubs or new Murmurs, No Parasternal Heave +ve B.Sounds, Abd Soft, Non tender, No organomegaly appriciated, No rebound -guarding or rigidity. No Cyanosis, Clubbing or edema, No new Rash or bruise   Data Review   CBC w Diff:  Lab  Results  Component Value Date   WBC 6.5 03/01/2021   HGB 15.0 03/01/2021   HCT 45.2 03/01/2021   PLT 256 03/01/2021   LYMPHOPCT 14 03/01/2021   MONOPCT 11 03/01/2021   EOSPCT 1 03/01/2021   BASOPCT 0 03/01/2021    CMP:  Lab Results  Component Value Date   NA 134 (L) 03/01/2021   K 4.2 03/01/2021   CL 90 (L) 03/01/2021   CO2 35 (H) 03/01/2021   BUN 16 03/01/2021   CREATININE 0.78 03/01/2021   PROT 6.1 (L) 03/01/2021   ALBUMIN 3.3 (L) 03/01/2021   BILITOT 0.6 03/01/2021   ALKPHOS 43 03/01/2021   AST 54 (H)  03/01/2021   ALT 106 (H) 03/01/2021  .   Total Time in preparing paper work, data evaluation and todays exam - 35 minutes  Susa Raring M.D on 03/01/2021 at 1:27 PM  [facility]

## 2021-03-01 NOTE — H&P (Signed)
Physical Medicine and Rehabilitation Admission H&P    Chief Complaint  Patient presents with   Shortness of Breath    HPI: Colin Stevens is a 50 year old Rh-male with history of HTN, nicotine dependence, ETOH abuse who was admitted on 02/19/21 with 3 week history of progressive SOB with BLE edema and unsteady gait, increase in abdominal girth, PND, productive cough, tremors, waxing and waning of MS with confusion and recurrent falls. He was noted be  hypoxic with elevated BNP-698, hyponatremic- Na 128,  and had DTs due to inability to consume usual amount of ETOH. CXR showed mild pulmonary edema and he was treated with IV lasix briefly for diuresis. MRI brain done which was negative for acute changes. MRI thoracolumbar spine showed normal thoracic spine and mild non compressive disc bulges from L1-L4, L4/L5 disc bulge more prominent on left with mild lateral stenosis possibly causing compression on left L5 nerve.  Abdominal ultrasound showed non-specific GB thickening and heterogenous, coarsened increased hepatic parenchymal echogenicity c/w with steatosis and possible cirrhosis.   Vitamin B1- 132, Vitamin B12- 259 ammonia levels WNL. Ionized Ca-1.12. Vitamin E-6.4 (low). Folate low at 5.5. TSH-3.067--WNL. 2 D echo showed EF 60%. Acute hypoxic respiratory failure felt to be due to acute on chornic CHF and resolved with diuresis and addition of BB. He was treated with clonidine and CIWA protocol with improvement in mentation. Dr. Otelia Limes consulted and felt that patient with ETOH withdrawal syndrome with subacute thiamine deficiency. He was started on B 12 supplement as well as high dose IV thiamine X 3 days then transitioned to oral dose.   He was started on liquid diet and required IVF 10/26 due to dehydration.   He developed left arm pain with erythrema and edema felt to be due to IV infiltration/trauma after patient pulling out IV. Ortho/Dr. Blanchie Dessert consulted and  recommended X rays to rule  out Fx as well as changing IV to right forearm.  Left forearm and elbow X rays done which showed soft tissue swelling posterior to the elbow but was negative for fracture. He was treated with Keflex and edema improving with low suspicion of septic arthritis. He continued to have left elbow pain and gout questioned therefore colchicine added as well as IV solumedrol X 3 days which completes today.   He had recurrent drop in sodium with concerns of SIADH therefore treated with salt tabs, Samsca 10/28 as well as high dose lasix for past 3 days. Mentation improving with decrease in anxiety and hypertensive emergency resolved. He continues to have intermittent confusion, fear of falling, limited mobility with tremors/shakes with fatigue and fatigue with tremors and pain left elbow. CIR recommended due to functional decline.    Pt reports pain 0/10 currently- trying to have BM- having a little constipation.  Using condom catheter- gout flare is almost resolved.    Review of Systems  Respiratory:  Negative for cough and shortness of breath.   Cardiovascular:  Positive for leg swelling. Negative for chest pain and orthopnea.  Gastrointestinal:  Positive for constipation.  Musculoskeletal:  Positive for back pain and myalgias.  Neurological:  Positive for sensory change (BLE numbness/tingling for 3 weeks PTA).  All other systems reviewed and are negative.   Past Medical History:  Diagnosis Date   Essential hypertension 02/19/2021   Nicotine dependence, cigarettes, uncomplicated 02/19/2021    Past Surgical History:  Procedure Laterality Date   TONSILLECTOMY      Family History  Problem Relation Age  of Onset   Heart disease Father    Cancer Maternal Grandfather     Social History:  Married. Used to work for [employer] due to back injury. He  reports that he has been smoking cigarettes. He has been smoking an average of 1 pack per day. He has never used smokeless tobacco. He  reports current alcohol use of about 77.0 standard drinks per week. He reports that he does not use drugs.   Allergies  Allergen Reactions   Aleve [Naproxen] Rash     Medications Prior to Admission  Medication Sig Dispense Refill   amLODipine (NORVASC) 5 MG tablet Take 5 mg by mouth at bedtime.     Calcium Carbonate Antacid (TUMS ULTRA 1000 PO) Take 1,000 mg by mouth 3 (three) times daily as needed (acid reflux).     diphenhydrAMINE HCl, Sleep, (SLEEP AID) 50 MG CAPS Take 50 mg by mouth at bedtime as needed (sleep).     ibuprofen (ADVIL) 200 MG tablet Take 600 mg by mouth 2 (two) times daily as needed (back pain).     lisinopril (ZESTRIL) 20 MG tablet Take 20 mg by mouth every morning.     melatonin 5 MG TABS Take 5 mg by mouth at bedtime.     Naphazoline-Glycerin (REDNESS RELIEF OP) Place 1 drop into both eyes daily as needed (redness/irritation/dry eyes).     omeprazole (PRILOSEC OTC) 20 MG tablet Take 20 mg by mouth every morning.     propranolol (INDERAL) 20 MG tablet Take 20 mg by mouth 3 (three) times daily as needed (anxiety).     Pseudoephedrine HCl (SUDAFED 12 HOUR PO) Take 2 tablets by mouth 2 (two) times daily as needed (congestion).      Drug Regimen Review  Drug regimen was reviewed and remains appropriate with no significant issues identified  Home: Home Living Family/patient expects to be discharged to:: Private residence Living Arrangements: Spouse/significant other Available Help at Discharge: Family, Available 24 hours/day (Wife works from home) Type of Home: House Home Access: Stairs to enter Secretary/administrator of Steps: 4 Entrance Stairs-Rails: Right, Left Home Layout: One level Bathroom Shower/Tub: Associate Professor: Yes Home Equipment: None  Lives With: Spouse   Functional History: Prior Function Prior Level of Function : Independent/Modified Independent Mobility Comments: no use of AD ADLs  Comments: worked as Chartered certified accountant, Independent with ADLs/IADLs (Read Only) Level of Independence: Independent (Read Only) Comments: Doesn't work. Recent falls  Functional Status:  Mobility: Bed Mobility Overal bed mobility: Needs Assistance Bed Mobility: Supine to Sit Supine to sit: Min guard, HOB elevated Sit to supine: Mod assist General bed mobility comments: +rail, increased time Transfers Overall transfer level: Needs assistance Equipment used: Rolling walker (2 wheels) Transfer via Lift Equipment: Stedy Transfers: Sit to/from Stand Sit to Stand: Min assist, +2 safety/equipment, +2 physical assistance Stand pivot transfers: Min assist General transfer comment: initially +2 min to power up to stand. Progressed to min assist +2 safety, cues for sequencing Ambulation/Gait Ambulation/Gait assistance: +2 safety/equipment, +2 physical assistance, Min assist Gait Distance (Feet): 100 Feet (+ 25') Assistive device: Rolling walker (2 wheels) Gait Pattern/deviations: Step-through pattern, Decreased stride length General Gait Details: slow, guard gait; trembling/shaking noted. Initially ambulated in room 25' with RW +2 min assist. This helped build pt's confidence for gait trial in hallway. Pt able to amb in hall 100' min assist with RW, +2 safety/close chair follow Gait velocity: decreased Gait velocity interpretation: <1.8 ft/sec, indicate of risk  for recurrent falls Pre-gait activities: Tolerated 3x 15-20-sec bouts of standing in stedy with BUE support on stedy bar    ADL: ADL Overall ADL's : Needs assistance/impaired Eating/Feeding: Set up, Bed level Grooming: Bed level, Supervision/safety Upper Body Bathing: Minimal assistance, Sitting Lower Body Bathing: Maximal assistance, +2 for physical assistance, +2 for safety/equipment, Sit to/from stand Upper Body Dressing : Minimal assistance, Sitting Upper Body Dressing Details (indicate cue type and reason): donned gown seated on  eob Lower Body Dressing: Maximal assistance, +2 for physical assistance, +2 for safety/equipment, Sit to/from stand Toileting- Clothing Manipulation and Hygiene: Total assistance, Bed level Functional mobility during ADLs: Minimal assistance, Rolling walker (2 wheels) General ADL Comments: demonstrated decreased fear of falling  Cognition: Cognition Overall Cognitive Status: Impaired/Different from baseline Orientation Level: Oriented X4 Cognition Arousal/Alertness: Awake/alert Behavior During Therapy: Anxious, Flat affect Overall Cognitive Status: Impaired/Different from baseline Area of Impairment: Orientation, Attention, Memory, Following commands, Safety/judgement, Awareness, Problem solving Orientation Level: Disoriented to, Time Current Attention Level: Sustained Memory: Decreased short-term memory Following Commands: Follows one step commands with increased time Safety/Judgement: Decreased awareness of safety, Decreased awareness of deficits Awareness: Emergent Problem Solving: Slow processing, Requires verbal cues, Difficulty sequencing General Comments: followed commands and less fear of falling   Blood pressure (!) 151/96, pulse 92, temperature 97.8 F (36.6 C), temperature source Oral, resp. rate 18, height 6\' 4"  (1.93 m), weight (!) 141 kg, SpO2 90 %. Physical Exam Vitals and nursing note reviewed. Exam conducted with a chaperone present.  Constitutional:      Appearance: Normal appearance. He is well-developed. He is obese.     Comments: Pt sitting on BSC- wife at bedside; BMI 37; has condom catheter- NAD  HENT:     Head: Normocephalic and atraumatic.     Comments: Smile equal; EOM intact B/L     Right Ear: External ear normal.     Left Ear: External ear normal.     Nose: Nose normal. No congestion.     Mouth/Throat:     Mouth: Mucous membranes are dry.     Pharynx: Oropharynx is clear. No oropharyngeal exudate.  Eyes:     General:        Right eye: No  discharge.        Left eye: No discharge.     Extraocular Movements: Extraocular movements intact.  Cardiovascular:     Rate and Rhythm: Regular rhythm. Tachycardia present.     Heart sounds: Normal heart sounds. No murmur heard.   No gallop.  Pulmonary:     Comments: CTA B/L- no W/R/R- good air movement   Abdominal:     Comments: Soft, NT, ND, (+)BS ; protuberant    Genitourinary:    Comments: Condom catheter- light amber urine Musculoskeletal:     Cervical back: Normal range of motion. No rigidity.     Comments: MS- 5/5 in UE B/L LE's 5-/5 in LE's B/L  L elbow- trace redness; no warmth- no swelling  Skin:    Comments: Left forearm with scab and erythema. Stasis changes BLE.  Couple of scabs- IV L forearm- looks OK  Neurological:     Mental Status: He is alert.     Comments: No axterixis B/L No flapping Intact to light touch B/L x 4 extremities  Psychiatric:     Comments: Appropriate, slightly flat    Results for orders placed or performed during the hospital encounter of 02/19/21 (from the past 48 hour(s))  Magnesium     Status:  None   Collection Time: 02/28/21  1:18 AM  Result Value Ref Range   Magnesium 1.8 1.7 - 2.4 mg/dL    Comment: Performed at [facility], [address] [place], [place] [address]  Comprehensive metabolic panel     Status: Abnormal   Collection Time: 02/28/21  1:18 AM  Result Value Ref Range   Sodium 132 (L) 135 - 145 mmol/L   Potassium 4.0 3.5 - 5.1 mmol/L   Chloride 93 (L) 98 - 111 mmol/L   CO2 32 22 - 32 mmol/L   Glucose, Bld 119 (H) 70 - 99 mg/dL    Comment: Glucose reference range applies only to samples taken after fasting for at least 8 hours.   BUN 16 6 - 20 mg/dL   Creatinine, Ser 9.56 0.61 - 1.24 mg/dL   Calcium 9.4 8.9 - 21.3 mg/dL   Total Protein 6.2 (L) 6.5 - 8.1 g/dL   Albumin 3.2 (L) 3.5 - 5.0 g/dL   AST 66 (H) 15 - 41 U/L   ALT 88 (H) 0 - 44 U/L   Alkaline Phosphatase 44 38 - 126 U/L   Total Bilirubin 0.7 0.3 -  1.2 mg/dL   GFR, Estimated >08 >65 mL/min    Comment: (NOTE) Calculated using the CKD-EPI Creatinine Equation (2021)    Anion gap 7 5 - 15    Comment: Performed at [facility], [address], [place], [place] [address]  CBC with Differential/Platelet     Status: Abnormal   Collection Time: 02/28/21  1:18 AM  Result Value Ref Range   WBC 7.0 4.0 - 10.5 K/uL   RBC 4.17 (L) 4.22 - 5.81 MIL/uL   Hemoglobin 14.3 13.0 - 17.0 g/dL   HCT 62.9 52.8 - 41.3 %   MCV 102.4 (H) 80.0 - 100.0 fL   MCH 34.3 (H) 26.0 - 34.0 pg   MCHC 33.5 30.0 - 36.0 g/dL   RDW 24.4 01.0 - 27.2 %   Platelets 240 150 - 400 K/uL   nRBC 0.0 0.0 - 0.2 %   Neutrophils Relative % 84 %   Neutro Abs 5.8 1.7 - 7.7 K/uL   Lymphocytes Relative 7 %   Lymphs Abs 0.5 (L) 0.7 - 4.0 K/uL   Monocytes Relative 9 %   Monocytes Absolute 0.6 0.1 - 1.0 K/uL   Eosinophils Relative 0 %   Eosinophils Absolute 0.0 0.0 - 0.5 K/uL   Basophils Relative 0 %   Basophils Absolute 0.0 0.0 - 0.1 K/uL   Immature Granulocytes 0 %   Abs Immature Granulocytes 0.02 0.00 - 0.07 K/uL    Comment: Performed at [facility], [address], [place], [place] [address]  Brain natriuretic peptide     Status: Abnormal   Collection Time: 02/28/21  1:18 AM  Result Value Ref Range   B Natriuretic Peptide 497.3 (H) 0.0 - 100.0 pg/mL    Comment: Performed at [facility], [address], [place], [place] [address]  Magnesium     Status: None   Collection Time: 03/01/21  1:59 AM  Result Value Ref Range   Magnesium 1.8 1.7 - 2.4 mg/dL    Comment: Performed at [facility], [address], [place], [place] [address]  Comprehensive metabolic panel     Status: Abnormal   Collection Time: 03/01/21  1:59 AM  Result Value Ref Range   Sodium 134 (L) 135 - 145 mmol/L   Potassium 4.2  3.5 - 5.1 mmol/L   Chloride 90 (L) 98 - 111 mmol/L   CO2 35 (H) 22 - 32 mmol/L   Glucose, Bld 108 (H) 70 - 99 mg/dL    Comment: Glucose reference range  applies only to samples taken after fasting for at least 8 hours.   BUN 16 6 - 20 mg/dL   Creatinine, Ser 0.17 0.61 - 1.24 mg/dL   Calcium 9.5 8.9 - 51.0 mg/dL   Total Protein 6.1 (L) 6.5 - 8.1 g/dL   Albumin 3.3 (L) 3.5 - 5.0 g/dL   AST 54 (H) 15 - 41 U/L   ALT 106 (H) 0 - 44 U/L   Alkaline Phosphatase 43 38 - 126 U/L   Total Bilirubin 0.6 0.3 - 1.2 mg/dL   GFR, Estimated >25 >85 mL/min    Comment: (NOTE) Calculated using the CKD-EPI Creatinine Equation (2021)    Anion gap 9 5 - 15    Comment: Performed at [facility], [address], [place], [place] [address]  CBC with Differential/Platelet     Status: Abnormal   Collection Time: 03/01/21  1:59 AM  Result Value Ref Range   WBC 6.5 4.0 - 10.5 K/uL   RBC 4.41 4.22 - 5.81 MIL/uL   Hemoglobin 15.0 13.0 - 17.0 g/dL   HCT 42.3 53.6 - 14.4 %   MCV 102.5 (H) 80.0 - 100.0 fL   MCH 34.0 26.0 - 34.0 pg   MCHC 33.2 30.0 - 36.0 g/dL   RDW 31.5 40.0 - 86.7 %   Platelets 256 150 - 400 K/uL   nRBC 0.0 0.0 - 0.2 %   Neutrophils Relative % 74 %   Neutro Abs 4.8 1.7 - 7.7 K/uL   Lymphocytes Relative 14 %   Lymphs Abs 0.9 0.7 - 4.0 K/uL   Monocytes Relative 11 %   Monocytes Absolute 0.7 0.1 - 1.0 K/uL   Eosinophils Relative 1 %   Eosinophils Absolute 0.1 0.0 - 0.5 K/uL   Basophils Relative 0 %   Basophils Absolute 0.0 0.0 - 0.1 K/uL   Immature Granulocytes 0 %   Abs Immature Granulocytes 0.01 0.00 - 0.07 K/uL    Comment: Performed at [facility], [address], [place], [place] [address]  Brain natriuretic peptide     Status: Abnormal   Collection Time: 03/01/21  1:59 AM  Result Value Ref Range   B Natriuretic Peptide 183.1 (H) 0.0 - 100.0 pg/mL    Comment: Performed at [facility], [address], [place], [place] [address]   No results found.     Medical Problem List and Plan: 1.  Debility secondary to complicated medical issues including CHF exacerbation and newly dx'd cirrhosis  -patient may   shower  -ELOS/Goals: 10-14 days- supervision to min A 2.  Antithrombotics: -DVT/anticoagulation:  Pharmaceutical: Lovenox  -antiplatelet therapy: N/A 3. Pain Management: Tylenol prn.  4. Mood: LCSW to follow for evaluation and support. Used propranolol tid prn for anxiety-->will resume.   -antipsychotic agents: N/A 5. Neuropsych: This patient is not fully capable of making decisions on his own behalf. 6. Skin/Wound Care: Routine pressure relief measures.   --monitor elbow for any changes.  7. Fluids/Electrolytes/Nutrition: Monitor I/O. Check lytes in am. 8. ETOH abuse/DT: Librium being slowly tapered--continue at 5 mg TID  --Continue thiamine 100 mg daily and cyanocobalamin 1000 mg daily  --Folic acid 1 mg daily 9. HTN: Monitor BP tid--on Zestril. Resume low dose catapres bid--question reflex  tachycardia.  10. Left elbow erythema/pain/edema: Completed 4 day course of Keflex per ortho  --On colchicine bid with solumedrol taper -->completes in am.   11.  Hyponatremia: Has improved from 128-->134. Continue salt tabs.  --continue 1500 cc FR.  12. Hypomagnesemia: Improved after supplement. --Recheck level in am. Will add oral supplement.  13. Constipation: Will start Senna S- needs to get cleaned out, possibly- might need sorbitol?     I have personally performed a face to face diagnostic evaluation of this patient and formulated the key components of the plan.  Additionally, I have personally reviewed laboratory data, imaging studies, as well as relevant notes and concur with the physician assistant's documentation above.   The patient's status has not changed from the original H&P.  Any changes in documentation from the acute care chart have been noted above.     Jacquelynn Cree, PA-C 03/01/2021

## 2021-03-01 NOTE — Progress Notes (Signed)
Inpatient Rehab Admissions Coordinator:   Pt and spouse wish to pursue CIR and I have a bed available today.  Dr. Thedore Mins in agreement.  Will plan to admit today. TOC aware.   Estill Dooms, PT, DPT Admissions Coordinator [phone] 03/01/21  2:04 PM

## 2021-03-01 NOTE — Progress Notes (Signed)
PMR Admission Coordinator Pre-Admission Assessment   Patient: Colin Stevens is an 50 y.o., male MRN: [id number] DOB: 01-03-1971 Height: 6' 4"  (193 cm) Weight: (!) 141 kg   Insurance Information HMO:     PPO:      PCP:      IPA:      80/20:      OTHER:  PRIMARY: uninsured      Estimate of cost of care provided to patient and wife on 02/25/2021    Financial Counselor: First source assessing for disability and Medicaid   The Therapist, art Information Summary" for patients in Inpatient Rehabilitation Facilities with attached "Privacy Act [place] Records" was provided and verbally reviewed with: N/A   Emergency Contact Information Contact Information       Name Relation Home Work [place] Spouse [phone]   [phone]    [name] Mother [phone]             Current Medical History  Patient Admitting Diagnosis: Debility   History of Present Illness: 50 year old male with history of HTN and alcohol abuse. Presented on 02/19/2021 with shortness of breath and increasing tremors/DT's over 3 weeks prior to admit. He had fallen at work with a back injury and then over time became weaker, with multiple falls and increasing shortness of breath. He has history of heavy alcohol use but has not been able to drink as much due to injury. In the ED found to be hypoxic and CXR revealed pulmonary edema. MRI reveled no evidence of acute disease.    Acute hypoxic respiratory failure due to acute on chronic CHF. EF 60%. Given IV  Lasix, and beta blocker. For his DT'S began CIWA protocol, added catapres. For borderline B12 deficiency added replacement , as well as thiamine and folic acid. For his hypertensive emergency, treating DT's and meds adjusted for his BP. Mild L5 left sided nerve compression per MRI. Ultrasound suggestive of cirrhosis with thrombocytopenia. Hyponatremia. Initially CHF and diuresed. Some question of developing SIADH.    Left elbow redness  and pain with Ortho consulted. Placed on Empiric IV antibiotics and one dose of systemic steroid. Lovenox for DVT prophylaxis.   Patient's medical record from [facility] has been reviewed by the rehabilitation admission coordinator and physician.   Past Medical History      Past Medical History:  Diagnosis Date   Essential hypertension 02/19/2021   Nicotine dependence, cigarettes, uncomplicated 10/25/9483    Has the patient had major surgery during 100 days prior to admission? No   Family History   family history includes Cancer in his maternal grandfather; Heart disease in his father.   Current Medications   Current Facility-Administered Medications:    acetaminophen (TYLENOL) tablet 650 mg, 650 mg, Oral, Q6H PRN, 650 mg at 03/01/21 1023 **OR** acetaminophen (TYLENOL) suppository 650 mg, 650 mg, Rectal, Q6H PRN, Shalhoub, Sherryll Burger, MD   albuterol (PROVENTIL) (2.5 MG/3ML) 0.083% nebulizer solution 2.5 mg, 2.5 mg, Nebulization, Q6H PRN, Thurnell Lose, MD, 2.5 mg at 02/21/21 0834   carvedilol (COREG) tablet 12.5 mg, 12.5 mg, Oral, BID WC, Lala Lund K, MD, 12.5 mg at 03/01/21 4627   chlordiazePOXIDE (LIBRIUM) capsule 5 mg, 5 mg, Oral, BID, Thurnell Lose, MD   colchicine tablet 0.6 mg, 0.6 mg, Oral, BID, Lala Lund K, MD, 0.6 mg at 03/01/21 0350   diclofenac Sodium (VOLTAREN) 1 % topical gel 2 g, 2 g, Topical, TID PRN, Thurnell Lose, MD  enoxaparin (LOVENOX) injection 75 mg, 75 mg, Subcutaneous, Q24H, Thurnell Lose, MD, 75 mg at 76/54/65 0354   folic acid (FOLVITE) tablet 1 mg, 1 mg, Oral, Daily, Shalhoub, Sherryll Burger, MD, 1 mg at 03/01/21 6568   lisinopril (ZESTRIL) tablet 5 mg, 5 mg, Oral, Daily, Thurnell Lose, MD, 5 mg at 03/01/21 1275   loperamide (IMODIUM) capsule 2 mg, 2 mg, Oral, Q6H PRN, Thurnell Lose, MD, 2 mg at 02/21/21 1216   [START ON 03/02/2021] methylPREDNISolone sodium succinate (SOLU-MEDROL) 40 mg/mL injection 25 mg, 25 mg,  Intravenous, Q24H, Thurnell Lose, MD   multivitamin with minerals tablet 1 tablet, 1 tablet, Oral, Daily, Shalhoub, Sherryll Burger, MD, 1 tablet at 03/01/21 1700   nicotine (NICODERM CQ - dosed in mg/24 hours) patch 21 mg, 21 mg, Transdermal, Daily, Shalhoub, Sherryll Burger, MD, 21 mg at 03/01/21 0931   nystatin (MYCOSTATIN) 100000 UNIT/ML suspension 500,000 Units, 5 mL, Mouth/Throat, QID, Thurnell Lose, MD, 500,000 Units at 03/01/21 0932   [DISCONTINUED] ondansetron (ZOFRAN) tablet 4 mg, 4 mg, Oral, Q6H PRN **OR** ondansetron (ZOFRAN) injection 4 mg, 4 mg, Intravenous, Q6H PRN, Shalhoub, Sherryll Burger, MD   pantoprazole (PROTONIX) EC tablet 40 mg, 40 mg, Oral, Daily, Heloise Purpura, RPH, 40 mg at 03/01/21 1749   polyethylene glycol (MIRALAX / GLYCOLAX) packet 17 g, 17 g, Oral, Daily PRN, Vernelle Emerald, MD, 17 g at 02/23/21 1237   sodium chloride tablet 1 g, 1 g, Oral, BID WC, Thurnell Lose, MD, 1 g at 03/01/21 4496   thiamine tablet 100 mg, 100 mg, Oral, Daily, Thurnell Lose, MD, 100 mg at 03/01/21 7591   vitamin B-12 (CYANOCOBALAMIN) tablet 1,000 mcg, 1,000 mcg, Oral, Daily, Thurnell Lose, MD, 1,000 mcg at 03/01/21 6384   Patients Current Diet:  Diet Order                  DIET SOFT Room service appropriate? Yes; Fluid consistency: Thin; Fluid restriction: 1500 mL Fluid  Diet effective now                       Precautions / Restrictions Precautions Precautions: Fall, Other (comment) Precaution Comments: monitor O2, fearful of falling Restrictions Weight Bearing Restrictions: No    Has the patient had 2 or more falls or a fall with injury in the past year? 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 Independent   Indoor Mobility: Did the patient need assistance with  walking from room to room (with or without device)? Independent   Stairs: Did the patient need assistance with internal or external stairs (with or without device)? Independent   Functional Cognition: Did the patient need help planning regular tasks such as shopping or remembering to take medications? Independent   Patient Information Are you of Hispanic, Latino/a,or Spanish origin?: A. No, not of Hispanic, Latino/a, or Spanish origin What is your race?: A. White Do you need or want an interpreter to communicate with a doctor or health care staff?: 0. No   Patient's Response To:  Health Literacy and Transportation Is the patient able to respond to health literacy and transportation needs?: Yes Health Literacy - How often do you need to have someone help you when you read instructions, pamphlets,  or other written material from your doctor or pharmacy?: Never In the past 12 months, has lack of transportation kept you from medical appointments or from getting medications?: No In the past 12 months, has lack of transportation kept you from meetings, work, or from getting things needed for daily living?: No   Development worker, international aid / [place] Devices/Equipment: None Home Equipment: None   Prior Device Use: Indicate devices/aids used by the patient prior to current illness, exacerbation or injury? None of the above   Current Functional Level Cognition   Overall Cognitive Status: Impaired/Different from baseline Current Attention Level: Sustained Orientation Level: Oriented X4 Following Commands: Follows one step commands with increased time Safety/Judgement: Decreased awareness of safety, Decreased awareness of deficits General Comments: followed commands and less fear of falling    Extremity Assessment (includes Sensation/Coordination)   Upper Extremity Assessment: Defer to OT evaluation LUE Deficits / Details: L elbow swollen and red. Per wife, pt had pulled IV out  overnight and she wonders if he bumped his elbow on bedrail. sensitive to touch but ROM functional. Per MD, suspicious for gout  Lower Extremity Assessment: Defer to PT evaluation RLE Deficits / Details: Grossly 2+ to 3-/5 LLE Deficits / Details: Grossly 2+ to 3-/5     ADLs   Overall ADL's : Needs assistance/impaired Eating/Feeding: Set up, Bed level Grooming: Bed level, Supervision/safety Upper Body Bathing: Minimal assistance, Sitting Lower Body Bathing: Maximal assistance, +2 for physical assistance, +2 for safety/equipment, Sit to/from stand Upper Body Dressing : Minimal assistance, Sitting Upper Body Dressing Details (indicate cue type and reason): donned gown seated on eob Lower Body Dressing: Maximal assistance, +2 for physical assistance, +2 for safety/equipment, Sit to/from stand Toileting- Clothing Manipulation and Hygiene: Total assistance, Bed level Functional mobility during ADLs: Minimal assistance, Rolling walker (2 wheels) General ADL Comments: demonstrated decreased fear of falling     Mobility   Overal bed mobility: Needs Assistance Bed Mobility: Supine to Sit Supine to sit: Min guard, HOB elevated Sit to supine: Mod assist General bed mobility comments: +rail, increased time     Transfers   Overall transfer level: Needs assistance Equipment used: Rolling walker (2 wheels) Transfer via Lift Equipment: Stedy Transfers: Sit to/from Stand Sit to Stand: Min assist, +2 safety/equipment, +2 physical assistance Stand pivot transfers: Min assist General transfer comment: initially +2 min to power up to stand. Progressed to min assist +2 safety, cues for sequencing     Ambulation / Gait / Stairs / Wheelchair Mobility   Ambulation/Gait Ambulation/Gait assistance: +2 safety/equipment, +2 physical assistance, Min assist Gait Distance (Feet): 100 Feet (+ 25') Assistive device: Rolling walker (2 wheels) Gait Pattern/deviations: Step-through pattern, Decreased stride  length General Gait Details: slow, guard gait; trembling/shaking noted. Initially ambulated in room 25' with RW +2 min assist. This helped build pt's confidence for gait trial in hallway. Pt able to amb in hall 100' min assist with RW, +2 safety/close chair follow Gait velocity: decreased Gait velocity interpretation: <1.8 ft/sec, indicate of risk for recurrent falls Pre-gait activities: Tolerated 3x 15-20-sec bouts of standing in stedy with BUE support on stedy bar     Posture / Balance Dynamic Sitting Balance Sitting balance - Comments: able to sit on eob without assistance Balance Overall balance assessment: Needs assistance Sitting-balance support: No upper extremity supported, Feet supported Sitting balance-Leahy Scale: Fair Sitting balance - Comments: able to sit on eob without assistance Postural control: Posterior lean Standing balance support: Bilateral upper extremity supported, During functional  activity Standing balance-Leahy Scale: Poor Standing balance comment: reliant on BUE support     Special needs/care consideration CIWA protocol    Previous Home Environment  Living Arrangements: Spouse/significant other  Lives With: Spouse Available Help at Discharge: Family, Available 24 hours/day (Wife works from home) Type of Home: House Home Layout: One level Home Access: Stairs to enter Entrance Stairs-Rails: Right, Left Entrance Stairs-Number of Steps: 4 Bathroom Shower/Tub: Chiropodist: Standard Bathroom Accessibility: Yes How Accessible: Accessible via walker [place]: No   Discharge Living Setting Plans for Discharge Living Setting: Patient's home, Lives with (comment) (wife) Type of Home at Discharge: House Discharge Home Layout: One level Discharge Home Access: Stairs to enter Entrance Stairs-Rails: Right, Left Entrance Stairs-Number of Steps: 4 Discharge Bathroom Shower/Tub: Tub/shower unit Discharge Bathroom Toilet:  Standard Discharge Bathroom Accessibility: Yes How Accessible: Accessible via walker Does the patient have any problems obtaining your medications?: Yes (Describe) (uninsured)   Social/Family/Support Systems Patient Roles: Spouse 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, Government social research officer Anticipated Caregiver: wife Anticipated Ambulance person Information: see contact info Ability/Limitations of Caregiver: works from home; no limitations Caregiver Availability: 24/7 Discharge Plan Discussed with Primary Caregiver: Yes Is Caregiver In Agreement with Plan?: Yes Does Caregiver/Family have Issues with Lodging/Transportation while Pt is in Rehab?: No   Goals Patient/Family Goal for Rehab: supervision to min assist with PT and OT, supervision with SLP Expected length of stay: ELOS 10 to 12 days Additional Information: CIWA protocol Pt/Family Agrees to Admission and willing to participate: Yes Program Orientation Provided & Reviewed with Pt/Caregiver Including Roles  & Responsibilities: Yes  Barriers to Discharge: Insurance for SNF coverage   Decrease burden of Care through IP rehab admission: n/a   Possible need for SNF placement upon discharge: not anticipated   Patient Condition: I have reviewed medical records from [facility], spoken with  patient and spouse. I met with patient at the bedside for inpatient rehabilitation assessment.  Patient will benefit from ongoing PT, OT, and SLP, can actively participate in 3 hours of therapy a day 5 days of the week, and can make measurable gains during the admission.  Patient will also benefit from the coordinated team approach during an Inpatient Acute Rehabilitation admission.  The patient will receive intensive therapy as well as Rehabilitation physician, nursing, social worker, and care management interventions.  Due to bladder management, bowel management, safety, skin/wound care, disease management, medication administration, pain management, and patient  education the patient requires 24 hour a day rehabilitation nursing.  The patient is currently mod to max assist overall with mobility and basic ADLs.  Discharge setting and therapy post discharge at home with home health is anticipated.  Patient has agreed to participate in the Acute Inpatient Rehabilitation Program and will admit today.   Preadmission Screen Completed By: Danne Baxter RN MSN with updates by Michel Santee, 03/01/2021 2:02 PM ______________________________________________________________________   Discussed status with Dr. Dagoberto Ligas on 03/01/21  at 2:02 PM  and received approval for admission today.   Admission Coordinator: Danne Baxter RN MSN with updates by Michel Santee, PT, time 2:03 PM Sudie Grumbling 03/01/21     Assessment/Plan: Diagnosis: Does the need for close, 24 hr/day Medical supervision in concert with the patient's rehab needs make it unreasonable for this patient to be served in a less intensive setting? Yes Co-Morbidities requiring supervision/potential complications: cirrhosis, acute on chronic dCHF- resp failure- acute; constipation;  HTN emergency- improved0 L elbow infectiob; gout Due to bladder management, bowel  management, safety, skin/wound care, disease management, medication administration, pain management, and patient education, does the patient require 24 hr/day rehab nursing? Yes Does the patient require coordinated care of a physician, rehab nurse, PT, OT, and SLP to address physical and functional deficits in the context of the above medical diagnosis(es)? Yes Addressing deficits in the following areas: balance, endurance, locomotion, strength, transferring, bowel/bladder control, bathing, dressing, feeding, grooming, toileting, cognition, and psychosocial support Can the patient actively participate in an intensive therapy program of at least 3 hrs of therapy 5 days a week? Yes The potential for patient to make measurable gains while on inpatient  rehab is good Anticipated functional outcomes upon discharge from inpatient rehab: supervision and min assist PT, supervision and min assist OT, min assist SLP Estimated rehab length of stay to reach the above functional goals is: 10-14 days Anticipated discharge destination: Home 10. Overall Rehab/Functional Prognosis: good     MD Signature:

## 2021-03-01 NOTE — Progress Notes (Signed)
Report given to CIR RN. Patient notified about transfer. Wife at bedside and packing patient's belongings. Patient to be transferred by staff when ready.

## 2021-03-01 NOTE — Progress Notes (Signed)
Mobility Specialist: Progress Note   03/01/21 1234  Mobility  Activity Ambulated in hall  Level of Assistance Modified independent, requires aide device or extra time  Assistive Device Four wheel walker  Distance Ambulated (ft) 470 ft  Mobility Ambulated with assistance in hallway  Mobility Response Tolerated well  Mobility performed by Mobility specialist  $Mobility charge 1 Mobility   Pre-Mobility: 94 HR, BP, 93% SpO2 Post-Mobility: 104 HR  Pt independent to sit EOB as well as to stand. Pt had no c/o during ambulation. Pt sitting EOB after walk with call bell at his side and family member present in the room.   [facility] Colin Stevens Mobility Specialist Mobility Specialist Phone: [phone]

## 2021-03-01 NOTE — Progress Notes (Signed)
PROGRESS NOTE                                                                                                                                                                                                             Patient Demographics:    Colin Stevens, is a 50 y.o. male, DOB - [birth year], QZE:[id number]  Outpatient Primary MD for the patient is Rankins, Fanny Dance, MD    LOS - 10  Admit date - 02/19/2021    Chief Complaint  Patient presents with   Shortness of Breath       Brief Narrative (HPI from H&P)  50 year old male with past medical history of hypertension, nicotine dependence, alcohol abuse who presents to [facility] emergency department via EMS with shortness of breath and DTs.   Subjective:   Patient in bed, appears comfortable, denies any headache, no fever, no chest pain or pressure, no shortness of breath , no abdominal pain. No focal weakness.   Assessment  & Plan :     Acute Hypoxic Resp. Failure due to Acute on Chronic CHF -echocardiogram with EF 60% likely had mild acute on chronic diastolic CHF which has resolved post IV Lasix, continue beta-blocker.  Monitor closely.   2.  DTS - Librium dose adjusted, continue with CIWA protocol, added Catapres, also has borderline B12 deficiency and placed on replacement, also on thiamine and folic acid , seen by neuro. MRI brain unremarkable. Gradually improving.  3.  Hypertensive urgency.  Treating DTs, medications adjusted for blood pressure.  Monitor.  4.  Fall at home.  MRI brain along with spine unremarkable.  Mild L5 left-sided nerve compression which will be monitored, PT OT >> CIR vs HHPT.  5. Ongoing nicotine dependence and alcohol abuse.  Counseled to quit.  6.  Obesity.  BMI of 40.  Follow with PCP for weight loss.  7.  Hypomagnesemia.  Replaced.  8.  Ultrasound suggestive of cirrhosis with thrombocytopenia on ultrasound.  Likely  alcoholic cirrhosis, thrombocytopenia likely due to it.  Negative acute hepatitis panel.  9.  Hyponatremia.  SIADH, improved after Samsca, continue Lasix PRN.  10.  Acute Gout -seen by orthopedics much improved after steroids and colchicine, steroids being tapered off, almost completely resolved.      Condition -   Guarded  Family Communication  :  Wife  bedside 02/20/21, called [phone] on 02/21/21, 02/23/21, 02/24/21 - bedside, 02/26/21, 02/28/21  Code Status :  Full  Consults  :  Neuro, Ortho  PUD Prophylaxis : PPI   Procedures  :     TTE - 1. Left ventricular ejection fraction, by estimation, is 60 to 65%. The left ventricle has normal function. The left ventricle has no regional wall motion abnormalities. Left ventricular diastolic function could not be evaluated.  2. Right ventricular systolic function is normal. The right ventricular size is normal. Tricuspid regurgitation signal is inadequate for assessing PA pressure.  3. The mitral valve is grossly normal. Trivial mitral valve regurgitation. No evidence of mitral stenosis.  4. The aortic valve was not well visualized. There is mild calcification of the aortic valve. Aortic valve regurgitation is not visualized. Mild aortic valve sclerosis is present, with no evidence of aortic valve stenosis.  RUQ [place] - Cirrhosis  MRI Brain and Spine - non acute MRI brain, MRI spine showing similar L-spine disease with possible mild L5 nerve compression on the left side.       Disposition Plan  :    Status is: Inpatient  Remains inpatient appropriate because: Severe DTs   DVT Prophylaxis  :  Lovenox   Lab Results  Component Value Date   PLT 256 03/01/2021    Diet :  Diet Order             DIET SOFT Room service appropriate? Yes; Fluid consistency: Thin; Fluid restriction: 1500 mL Fluid  Diet effective now                    Inpatient Medications  Scheduled Meds:  carvedilol  12.5 mg Oral BID WC    chlordiazePOXIDE  5 mg Oral BID   colchicine  0.6 mg Oral BID   enoxaparin (LOVENOX) injection  75 mg Subcutaneous Q24H   folic acid  1 mg Oral Daily   lisinopril  5 mg Oral Daily   [START ON 03/02/2021] methylPREDNISolone (SOLU-MEDROL) injection  25 mg Intravenous Q24H   multivitamin with minerals  1 tablet Oral Daily   nicotine  21 mg Transdermal Daily   nystatin  5 mL Mouth/Throat QID   pantoprazole  40 mg Oral Daily   sodium chloride  1 g Oral BID WC   thiamine  100 mg Oral Daily   vitamin B-12  1,000 mcg Oral Daily   Continuous Infusions:   PRN Meds:.acetaminophen **OR** acetaminophen, albuterol, diclofenac Sodium, loperamide, [DISCONTINUED] ondansetron **OR** ondansetron (ZOFRAN) IV, polyethylene glycol  Antibiotics  :    Anti-infectives (From admission, onward)    Start     Dose/Rate Route Frequency Ordered Stop   02/25/21 1800  cephALEXin (KEFLEX) capsule 500 mg  Status:  Discontinued        500 mg Oral Every 6 hours 02/25/21 1403 02/28/21 1035   02/24/21 1100  ceFAZolin (ANCEF) IVPB 1 g/50 mL premix  Status:  Discontinued        1 g 100 mL/hr over 30 Minutes Intravenous Every 8 hours 02/24/21 0932 02/25/21 1403        Time Spent in minutes  30   Susa Raring M.D on 03/01/2021 at 9:45 AM  To page go to [email or web address]   [facility] -  Office  [phone]  See all Orders from today for further details    Objective:   Vitals:   03/01/21 0500 03/01/21 0730 03/01/21 0811 03/01/21 0928  BP:  (!) 156/98 (!) 158/99  132/83  Pulse:  91 93   Resp:  18    Temp:  97.6 F (36.4 C)    TempSrc:  Oral    SpO2:  93%    Weight: (!) 141 kg     Height:        Wt Readings from Last 3 Encounters:  03/01/21 (!) 141 kg     Intake/Output Summary (Last 24 hours) at 03/01/2021 0945 Last data filed at 03/01/2021 0900 Gross per 24 hour  Intake 1020 ml  Output 1950 ml  Net -930 ml     Physical Exam  Awake Alert, No new F.N deficits, Normal  affect [place].AT,PERRAL Supple Neck, No JVD,   Symmetrical Chest wall movement, Good air movement bilaterally, CTAB RRR,No Gallops, Rubs or new Murmurs,  +ve B.Sounds, Abd Soft, No tenderness,   No Cyanosis, Clubbing or edema     Data Review:    CBC Recent Labs  Lab 02/25/21 0218 02/26/21 0110 02/27/21 0442 02/28/21 0118 03/01/21 0159  WBC 8.6 8.5 8.2 7.0 6.5  HGB 14.0 14.4 14.2 14.3 15.0  HCT 40.4 42.6 41.6 42.7 45.2  PLT 196 233 248 240 256  MCV 101.3* 103.4* 102.7* 102.4* 102.5*  MCH 35.1* 35.0* 35.1* 34.3* 34.0  MCHC 34.7 33.8 34.1 33.5 33.2  RDW 11.9 12.1 12.1 12.0 12.1  LYMPHSABS 0.3* 0.2* 0.4* 0.5* 0.9  MONOABS 1.0 0.2 0.6 0.6 0.7  EOSABS 0.0 0.1 0.0 0.0 0.1  BASOSABS 0.0 0.0 0.0 0.0 0.0    Recent Labs  Lab 02/24/21 0153 02/25/21 0218 02/26/21 0110 02/27/21 0442 02/28/21 0118 03/01/21 0159  NA 128* 129* 136 134* 132* 134*  K 4.1 4.1 4.2 4.3 4.0 4.2  CL 91* 93* 99 95* 93* 90*  CO2 31 29 29  33* 32 35*  GLUCOSE 98 123* 115* 125* 119* 108*  BUN 10 10 8 14 16 16   CREATININE 0.66 0.56* 0.59* 0.56* 0.67 0.78  CALCIUM 8.8* 9.0 9.4 9.7 9.4 9.5  AST 17 21 39 40 66* 54*  ALT 20 22 41 54* 88* 106*  ALKPHOS 38 46 48 46 44 43  BILITOT 1.2 0.6 0.7 0.7 0.7 0.6  ALBUMIN 3.0* 3.1* 3.2* 3.1* 3.2* 3.3*  MG 1.8 1.9 1.9 2.0 1.8 1.8  PROCALCITON <0.10 <0.10 <0.10  --   --   --   BNP 162.3* 448.8* 335.3* 622.6* 497.3* 183.1*    ------------------------------------------------------------------------------------------------------------------ No results for input(s): CHOL, HDL, LDLCALC, TRIG, CHOLHDL, LDLDIRECT in the last 72 hours.  No results found for: HGBA1C ------------------------------------------------------------------------------------------------------------------ No results for input(s): TSH, T4TOTAL, T3FREE, THYROIDAB in the last 72 hours.  Invalid input(s): FREET3   Cardiac Enzymes No results for input(s): CKMB, TROPONINI, MYOGLOBIN in the last 168  hours.  Invalid input(s): CK ------------------------------------------------------------------------------------------------------------------    Component Value Date/Time   BNP 183.1 (H) 03/01/2021 0159     Radiology Reports DG Chest 2 View  Result Date: 02/19/2021 CLINICAL DATA:  50 year old male with shortness of breath. EXAM: CHEST - 2 VIEW COMPARISON:  03/04/2014 FINDINGS: The mediastinal contours are within normal limits. Mild cardiomegaly. Low lung volumes. My cephalization of pulmonary vasculature. Mild diffuse hazy pulmonary opacities with a bibasilar predominance. No pneumothorax or evidence of significant pleural effusion. No acute osseous abnormality. IMPRESSION: Mild pulmonary edema. Electronically Signed   By: 76 M.D.   On: 02/19/2021 13:59   DG ELBOW COMPLETE LEFT (3+VIEW)  Result Date: 02/24/2021 CLINICAL DATA:  LEFT posterior elbow pain EXAM: LEFT ELBOW - COMPLETE 3+ VIEW  COMPARISON:  None. FINDINGS: No evidence of fracture of the ulna or humerus. The radial head is normal. No joint effusion. IMPRESSION: No fracture or dislocation. Electronically Signed   By: Genevive Bi M.D.   On: 02/24/2021 09:06   DG Forearm Left  Result Date: 02/24/2021 CLINICAL DATA:  Fall. EXAM: LEFT FOREARM - 2 VIEW COMPARISON:  Left elbow x-ray same day. FINDINGS: There is no acute fracture or dislocation. Joint spaces are maintained. Small enthesophyte is again noted from the anterior distal humerus. There is soft tissue swelling posterior to the elbow. IV is noted in the anterior forearm. IMPRESSION: 1. No acute bony abnormality. Electronically Signed   By: Darliss Cheney M.D.   On: 02/24/2021 16:13   CT Head Wo Contrast  Result Date: 02/19/2021 CLINICAL DATA:  Neuro deficit, acute, stroke suspected EXAM: CT HEAD WITHOUT CONTRAST TECHNIQUE: Contiguous axial images were obtained from the base of the skull through the vertex without intravenous contrast. COMPARISON:  None.  FINDINGS: Brain: No evidence of acute infarction, hemorrhage, hydrocephalus, extra-axial collection or mass lesion/mass effect. Vascular: No hyperdense vessel or unexpected calcification. Skull: Normal. Negative for fracture or focal lesion. Sinuses/Orbits: Mild mucosal thickening of the LEFT greater than RIGHT maxillary sinuses. Other: None. IMPRESSION: No acute intracranial abnormality. Electronically Signed   By: Meda Klinefelter M.D.   On: 02/19/2021 14:57   MR BRAIN WO CONTRAST  Result Date: 02/19/2021 CLINICAL DATA:  Neuro deficit, acute, stroke suspected. Generalized weakness and dizziness. EXAM: MRI HEAD WITHOUT CONTRAST TECHNIQUE: Multiplanar, multiecho pulse sequences of the brain and surrounding structures were obtained without intravenous contrast. COMPARISON:  Head CT same day FINDINGS: Brain: The brain has a normal appearance without evidence of malformation, atrophy, old or acute small or large vessel infarction, mass lesion, hemorrhage, hydrocephalus or extra-axial collection. Vascular: Major vessels at the base of the brain show flow. Venous sinuses appear patent. Skull and upper cervical spine: Normal. Sinuses/Orbits: Clear/normal. Other: None significant. IMPRESSION: Normal MRI of the brain. Electronically Signed   By: Paulina Fusi M.D.   On: 02/19/2021 18:23   MR THORACIC SPINE WO CONTRAST  Result Date: 02/19/2021 CLINICAL DATA:  Mid back pain.  Weakness and dizziness. EXAM: MRI THORACIC SPINE WITHOUT CONTRAST TECHNIQUE: Multiplanar, multisequence MR imaging of the thoracic spine was performed. No intravenous contrast was administered. COMPARISON:  None. FINDINGS: Alignment:  Normal Vertebrae: Normal Cord: Thoracic cord is normal. No focal lesion. No cord compression. Paraspinal and other soft tissues: Normal Disc levels: No evidence of disc degeneration, bulge or herniation. No facet arthropathy. Insignificant superior endplate Schmorl's node at T9 without edema. IMPRESSION: Normal  MRI of the thoracic spine. No cord abnormality. No abnormality seen to explain regional pain. Electronically Signed   By: Paulina Fusi M.D.   On: 02/19/2021 18:25   MR LUMBAR SPINE WO CONTRAST  Result Date: 02/19/2021 CLINICAL DATA:  Low back pain. Cauda equina syndrome. Generalized weakness. EXAM: MRI LUMBAR SPINE WITHOUT CONTRAST TECHNIQUE: Multiplanar, multisequence MR imaging of the lumbar spine was performed. No intravenous contrast was administered. COMPARISON:  None. FINDINGS: Segmentation:  5 lumbar type vertebral bodies assumed. Alignment:  Mild curvature convex to the left with the apex at L3. Vertebrae:  No fracture or focal bone lesion. Conus medullaris and cauda equina: Conus extends to the L1 level. Conus and cauda equina appear normal. Paraspinal and other soft tissues: Negative Disc levels: L1-2: Mild noncompressive disc bulge. L2-3: Mild noncompressive disc bulge. L3-4: Mild noncompressive disc bulge. L4-5: Mild bulging of the  disc. Mild facet and ligamentous hypertrophy. Mild narrowing of the lateral recesses left more than right. Some potential the left L5 nerve could be affected in this location. L5-S1: Normal interspace. IMPRESSION: Mild non-compressive disc bulges at L1-2, L2-3 and L3-4. L4-5 disc bulge more prominent towards the left. Mild facet and ligamentous hypertrophy. Mild stenosis of the left lateral recess at this level that could possibly cause compression of the left L5 nerve. Electronically Signed   By: Paulina Fusi M.D.   On: 02/19/2021 18:27   DG Chest Port 1 View  Result Date: 02/27/2021 CLINICAL DATA:  Acute respiratory failure with hypoxia. EXAM: PORTABLE CHEST 1 VIEW COMPARISON:  February 24, 2021. FINDINGS: Stable cardiomegaly. No pneumothorax or pleural effusion is noted. Stable chronic interstitial densities as described above. No definite acute abnormality is noted. Bony thorax is unremarkable. IMPRESSION: No acute cardiopulmonary abnormality seen. Electronically  Signed   By: Lupita Raider M.D.   On: 02/27/2021 09:25   DG Chest Port 1 View  Result Date: 02/24/2021 CLINICAL DATA:  Shortness of breath. EXAM: PORTABLE CHEST 1 VIEW COMPARISON:  02/21/2021 FINDINGS: 0732 hours. Low volume film. The cardio pericardial silhouette is enlarged. Interstitial markings are diffusely coarsened with chronic features. The lungs are clear without focal pneumonia, edema, pneumothorax or pleural effusion. The visualized bony structures of the thorax show no acute abnormality. Telemetry leads overlie the chest. IMPRESSION: Low volume film with chronic interstitial coarsening. Electronically Signed   By: Kennith Center M.D.   On: 02/24/2021 07:50   DG Chest Port 1 View  Result Date: 02/21/2021 CLINICAL DATA:  Shortness of breath. EXAM: PORTABLE CHEST 1 VIEW COMPARISON:  February 19, 2021. FINDINGS: The heart size and mediastinal contours are within normal limits. Minimal bibasilar subsegmental atelectasis or possibly edema is noted which is slightly improved compared to prior exam. The visualized skeletal structures are unremarkable. IMPRESSION: Slightly improved bibasilar opacities as described above. Electronically Signed   By: Lupita Raider M.D.   On: 02/21/2021 08:59   ECHOCARDIOGRAM COMPLETE  Result Date: 02/20/2021    ECHOCARDIOGRAM REPORT   Patient Name:   RODELL MARRS Date of Exam: 02/20/2021 Medical Rec #:  [id number]      Height:       76.0 in Accession #:    [id number]     Weight:       335.3 lb Date of Birth:  1970/11/08      BSA:          2.759 m Patient Age:    50 years       BP:           153/130 mmHg Patient Gender: M              HR:           111 bpm. Exam Location:  Inpatient Procedure: 2D Echo Indications:    acute systolic chf  History:        Patient has no prior history of Echocardiogram examinations.                 Risk Factors:Current Smoker, Alcohol use and Hypertension.  Sonographer:    Delcie Roch RDCS Referring Phys: [id number] Deno Lunger  [place] IMPRESSIONS  1. Left ventricular ejection fraction, by estimation, is 60 to 65%. The left ventricle has normal function. The left ventricle has no regional wall motion abnormalities. Left ventricular diastolic function could not be evaluated.  2. Right ventricular systolic function is normal. The  right ventricular size is normal. Tricuspid regurgitation signal is inadequate for assessing PA pressure.  3. The mitral valve is grossly normal. Trivial mitral valve regurgitation. No evidence of mitral stenosis.  4. The aortic valve was not well visualized. There is mild calcification of the aortic valve. Aortic valve regurgitation is not visualized. Mild aortic valve sclerosis is present, with no evidence of aortic valve stenosis. Comparison(s): No prior Echocardiogram. Conclusion(s)/Recommendation(s): Normal biventricular function without evidence of hemodynamically significant valvular heart disease. FINDINGS  Left Ventricle: Left ventricular ejection fraction, by estimation, is 60 to 65%. The left ventricle has normal function. The left ventricle has no regional wall motion abnormalities. The left ventricular internal cavity size was normal in size. There is  no left ventricular hypertrophy. Left ventricular diastolic function could not be evaluated. Right Ventricle: The right ventricular size is normal. Right vetricular wall thickness was not well visualized. Right ventricular systolic function is normal. Tricuspid regurgitation signal is inadequate for assessing PA pressure. Left Atrium: Left atrial size was normal in size. Right Atrium: Right atrial size was normal in size. Pericardium: There is no evidence of pericardial effusion. Presence of pericardial fat pad. Mitral Valve: The mitral valve is grossly normal. Trivial mitral valve regurgitation. No evidence of mitral valve stenosis. Tricuspid Valve: The tricuspid valve is grossly normal. Tricuspid valve regurgitation is trivial. No evidence of tricuspid  stenosis. Aortic Valve: The aortic valve was not well visualized. There is mild calcification of the aortic valve. Aortic valve regurgitation is not visualized. Mild aortic valve sclerosis is present, with no evidence of aortic valve stenosis. Pulmonic Valve: The pulmonic valve was not well visualized. Pulmonic valve regurgitation is not visualized. Aorta: The aortic root and ascending aorta are structurally normal, with no evidence of dilitation. Venous: The inferior vena cava was not well visualized. IAS/Shunts: The interatrial septum was not well visualized.  LEFT VENTRICLE PLAX 2D LVIDd:         6.00 cm LVIDs:         3.80 cm LV PW:         1.10 cm LV IVS:        1.10 cm LVOT diam:     2.80 cm LV SV:         101 LV SV Index:   37 LVOT Area:     6.16 cm  RIGHT VENTRICLE RV S prime:     15.80 cm/s TAPSE (M-mode): 2.4 cm LEFT ATRIUM           Index        RIGHT ATRIUM           Index LA diam:      4.40 cm 1.59 cm/m   RA Area:     19.00 cm LA Vol (A4C): 71.1 ml 25.77 ml/m  RA Volume:   51.10 ml  18.52 ml/m  AORTIC VALVE LVOT Vmax:   103.00 cm/s LVOT Vmean:  67.700 cm/s LVOT VTI:    0.164 m  AORTA Ao Root diam: 3.60 cm  SHUNTS Systemic VTI:  0.16 m Systemic Diam: 2.80 cm Jodelle Red MD Electronically signed by Jodelle Red MD Signature Date/Time: 02/20/2021/1:32:52 PM    Final    US Abdomen Limited RUQ (LIVER/GB)  Result Date: 02/19/2021 CLINICAL DATA:  Cirrhosis. EXAM: ULTRASOUND ABDOMEN LIMITED RIGHT UPPER QUADRANT COMPARISON:  None. FINDINGS: Gallbladder: Partially distended. No visualized gallstones. Diffuse gallbladder wall thickening at 6 mm. No sonographic Murphy sign noted by sonographer. Common bile duct: Diameter: 5 mm, normal. Liver:  Heterogeneous, coarsened, and diffusely increased in parenchymal echogenicity. The liver parenchyma is difficult to penetrate. Allowing for this, no focal lesion is seen. There may be a subtle capsular nodularity. Portal vein is patent on color  Doppler imaging with normal direction of blood flow towards the liver. Other: No right upper quadrant ascites. IMPRESSION: 1. Heterogeneous, coarsened, and increased hepatic parenchymal echogenicity. There may be mild capsular nodularity. Findings consistent with steatosis and possible cirrhosis. 2. Liver parenchyma is difficult to penetrate, no focal lesion is seen. 3. Mild gallbladder wall thickening is nonspecific in the setting of chronic liver disease. No gallstones. No biliary dilatation. Electronically Signed   By: Narda Rutherford M.D.   On: 02/19/2021 21:05

## 2021-03-02 LAB — CBC WITH DIFFERENTIAL/PLATELET
Abs Immature Granulocytes: 0.02 10*3/uL (ref 0.00–0.07)
Basophils Absolute: 0 10*3/uL (ref 0.0–0.1)
Basophils Relative: 1 %
Eosinophils Absolute: 0.1 10*3/uL (ref 0.0–0.5)
Eosinophils Relative: 3 %
HCT: 43.9 % (ref 39.0–52.0)
Hemoglobin: 15.2 g/dL (ref 13.0–17.0)
Immature Granulocytes: 0 %
Lymphocytes Relative: 19 %
Lymphs Abs: 1.1 10*3/uL (ref 0.7–4.0)
MCH: 34.9 pg — ABNORMAL HIGH (ref 26.0–34.0)
MCHC: 34.6 g/dL (ref 30.0–36.0)
MCV: 100.9 fL — ABNORMAL HIGH (ref 80.0–100.0)
Monocytes Absolute: 0.6 10*3/uL (ref 0.1–1.0)
Monocytes Relative: 10 %
Neutro Abs: 3.8 10*3/uL (ref 1.7–7.7)
Neutrophils Relative %: 67 %
Platelets: 261 10*3/uL (ref 150–400)
RBC: 4.35 MIL/uL (ref 4.22–5.81)
RDW: 12.1 % (ref 11.5–15.5)
WBC: 5.6 10*3/uL (ref 4.0–10.5)
nRBC: 0 % (ref 0.0–0.2)

## 2021-03-02 LAB — COMPREHENSIVE METABOLIC PANEL
ALT: 97 U/L — ABNORMAL HIGH (ref 0–44)
AST: 44 U/L — ABNORMAL HIGH (ref 15–41)
Albumin: 3.1 g/dL — ABNORMAL LOW (ref 3.5–5.0)
Alkaline Phosphatase: 36 U/L — ABNORMAL LOW (ref 38–126)
Anion gap: 7 (ref 5–15)
BUN: 12 mg/dL (ref 6–20)
CO2: 33 mmol/L — ABNORMAL HIGH (ref 22–32)
Calcium: 9.1 mg/dL (ref 8.9–10.3)
Chloride: 92 mmol/L — ABNORMAL LOW (ref 98–111)
Creatinine, Ser: 0.78 mg/dL (ref 0.61–1.24)
GFR, Estimated: >60 mL/min (ref >60)
Glucose, Bld: 92 mg/dL (ref 70–99)
Potassium: 3.5 mmol/L (ref 3.5–5.1)
Sodium: 132 mmol/L — ABNORMAL LOW (ref 135–145)
Total Bilirubin: 0.5 mg/dL (ref 0.3–1.2)
Total Protein: 5.8 g/dL — ABNORMAL LOW (ref 6.5–8.1)

## 2021-03-02 LAB — MAGNESIUM: Magnesium: 1.9 mg/dL (ref 1.7–2.4)

## 2021-03-02 NOTE — Evaluation (Signed)
Speech Language Pathology Assessment and Plan  Patient Details  Name: Colin Stevens MRN: [id number] Date of Birth: [birth year]  Today's Date: 03/02/2021 SLP Individual Time: 1100-1200 SLP Individual Time Calculation (Colin): 60 Colin  Hospital Problem: Principal Problem:   Debility Active Problems:   Fall (on) (from) other stairs and steps, subsequent encounter  Past Medical History:  Past Medical History:  Diagnosis Date   Essential hypertension 02/19/2021   Nicotine dependence, cigarettes, uncomplicated 67/61/9509   Past Surgical History:  Past Surgical History:  Procedure Laterality Date   TONSILLECTOMY      Assessment / Plan / Recommendation Clinical Impression  HPI: Colin Stevens is a 50 year old Rh-male with history of HTN, nicotine dependence, ETOH abuse who was admitted on 02/19/21 with 3 week history of progressive SOB with BLE edema and unsteady gait, increase in abdominal girth, PND, productive cough, tremors, waxing and waning of MS with confusion and recurrent falls. He was noted be hypoxic. MRI brain done which was negative for acute changes.   Patient seen for cognitive-linguistic and swallowing evaluations. Patient demonstrates functional cognition for all tasks assessed and both pt and spouse feel patient is at baseline level of functioning. Pt displayed decreased short-term recall of novel word list and occasionally referred to spouse for names of therapists, however he compensated with mod I and spoke in detail of compensations he uses at home to support his memory. SLP administered the [facility] Mental Status Examination (SLUMS) and patient scored 25/30 points with a score of 27 or above considered normal with short-term recall subtests being only areas where pt did not achieve ull points. Patient's overall auditory comprehension and verbal expression appeared [facility] for all tasks assessed and patient was 100% intelligible at the conversation level. Swallowing  assessed at bedside with regular textures and thin liquids revealing oropharyngeal swallow WFL. Diet was advanced to regular textures. Continue reg diet, thin liquids, meds whole or as tolerated. Further f/u on swallowing is not clinically indicated. SLP provided pt/family with education on compensatory memory strategies in which patient was able to teach back successfully at end of session with independence. Education complete. Skilled ST intervention does not appear clinically indicated at this time. ST to sign off at this time.    Skilled Therapeutic Interventions          Pt participated in [facility] Status Examination (SLUMS) as well as further non-standardized assessments of cognitive-linguistic, speech, and language function. Also complete bedside swallow evaluation. Please see above   SLP Assessment  Patient does not need any further Speech Lanaguage Pathology Services    Recommendations  SLP Diet Recommendations: Age appropriate regular solids;Thin Liquid Administration via: Straw;Cup Medication Administration: Whole meds with liquid Supervision: Patient able to self feed Patient destination: Home Follow up Recommendations: None Equipment Recommended: None recommended by SLP          Pain Pain Assessment Pain Score: 0-No pain  Prior Functioning Cognitive/Linguistic Baseline: Within functional limits Type of Home: House  Lives With: Spouse Available Help at Discharge: Family;Available 24 hours/day  SLP Evaluation Cognition Overall Cognitive Status: Within Functional Limits for tasks assessed Arousal/Alertness: Awake/alert Orientation Level: Oriented X4 Year: 2022 Month: November Day of Week: Correct Memory: Appears intact Awareness: Appears intact Problem Solving: Appears intact Safety/Judgment: Appears intact  Comprehension Auditory Comprehension Overall Auditory Comprehension: Appears within functional limits for tasks  assessed Expression Expression Primary Mode of Expression: Verbal Verbal Expression Overall Verbal Expression: Appears within functional limits for tasks  assessed Oral Motor Oral Motor/Sensory Function Overall Oral Motor/Sensory Function: Within functional limits Motor Speech Overall Motor Speech: Appears within functional limits for tasks assessed Intelligibility: Intelligible  Care Tool Care Tool Cognition Ability to hear (with hearing aid or hearing appliances if normally used Ability to hear (with hearing aid or hearing appliances if normally used): 0. Adequate - no difficulty in normal conservation, social interaction, listening to TV   Expression of Ideas and Wants Expression of Ideas and Wants: 4. Without difficulty (complex and basic) - expresses complex messages without difficulty and with speech that is clear and easy to understand   Understanding Verbal and Non-Verbal Content Understanding Verbal and Non-Verbal Content: 4. Understands (complex and basic) - clear comprehension without cues or repetitions  Memory/Recall Ability Memory/Recall Ability : Current season;Location of own room;That he or she is in a hospital/hospital unit   Intelligibility: Intelligible  Bedside Swallowing Assessment General Date of Onset: 02/19/21 Previous Swallow Assessment: unable to locate Diet Prior to this Study: Dysphagia 3 (soft);Thin liquids Temperature Spikes Noted: No Respiratory Status: Room air History of Recent Intubation: No Behavior/Cognition: Alert;Cooperative;Pleasant mood Oral Cavity - Dentition: Adequate natural dentition Self-Feeding Abilities: Able to feed self Patient Positioning: Upright in bed Baseline Vocal Quality: Normal Volitional Cough: Strong Volitional Swallow: Able to elicit  Oral Care Assessment Does patient have any of the following "high(er) risk" factors?: None of the above Does patient have any of the following "at risk" factors?: None of the  above Patient is LOW RISK: Follow universal precautions (see row information) Ice Chips Ice chips: Not tested Thin Liquid Thin Liquid: Within functional limits Presentation: Cup;Straw Nectar Thick Nectar Thick Liquid: Not tested Honey Thick Honey Thick Liquid: Not tested Puree Puree: Within functional limits Solid Solid: Within functional limits Presentation: Self Fed  Recommendations for other services: None   Discharge Criteria: Patient will be discharged from SLP if patient refuses treatment 3 consecutive times without medical reason, if treatment goals not met, if there is a change in medical status, if patient makes no progress towards goals or if patient is discharged from hospital.  The above assessment, treatment plan, treatment alternatives and goals were discussed and mutually agreed upon: by patient  Patty Sermons 03/02/2021, 4:52 PM

## 2021-03-02 NOTE — Evaluation (Signed)
Physical Therapy Assessment and Plan  Patient Details  Name: Colin Stevens MRN: [id number] Date of Birth: [birth year]  PT Diagnosis: Difficulty walking and Muscle weakness Rehab Potential: Good ELOS: ~7 days   Today's Date: 03/02/2021 PT Individual Time: 1302-1359 PT Individual Time Calculation (min): 57 min    Hospital Problem: Principal Problem:   Debility Active Problems:   Fall (on) (from) other stairs and steps, subsequent encounter   Past Medical History:  Past Medical History:  Diagnosis Date   Essential hypertension 02/19/2021   Nicotine dependence, cigarettes, uncomplicated 67/61/9509   Past Surgical History:  Past Surgical History:  Procedure Laterality Date   TONSILLECTOMY      Assessment & Plan Clinical Impression: Patient is a 50 year old Rh-male with history of HTN, nicotine dependence, ETOH abuse who was admitted on 02/19/21 with 3 week history of progressive SOB with BLE edema and unsteady gait, increase in abdominal girth, PND, productive cough, tremors, waxing and waning of MS with confusion and recurrent falls. He was noted be  hypoxic with elevated BNP-698, hyponatremic- Na 128,  and had DTs due to inability to consume usual amount of ETOH. CXR showed mild pulmonary edema and he was treated with IV lasix briefly for diuresis. MRI brain done which was negative for acute changes. MRI thoracolumbar spine showed normal thoracic spine and mild non compressive disc bulges from L1-L4, L4/L5 disc bulge more prominent on left with mild lateral stenosis possibly causing compression on left L5 nerve.  Abdominal ultrasound showed non-specific GB thickening and heterogenous, coarsened increased hepatic parenchymal echogenicity c/w with steatosis and possible cirrhosis.    Vitamin B1- 132, Vitamin B12- 259 ammonia levels WNL. Ionized Ca-1.12. Vitamin E-6.4 (low). Folate low at 5.5. TSH-3.067--WNL. 2 D echo showed EF 60%. 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 He was treated with clonidine and CIWA protocol with improvement in mentation. Dr. Cheral Marker consulted and felt that patient with ETOH withdrawal syndrome with subacute thiamine deficiency. He was started on B 12 supplement as well as high dose IV thiamine X 3 days then transitioned to oral dose.   He was started on liquid diet and required IVF 10/26 due to dehydration.    He developed left arm pain with erythrema and edema felt to be due to IV infiltration/trauma after patient pulling out IV. Ortho/Dr. Zachery Dakins consulted and  recommended X rays to rule out Fx as well as changing IV to right forearm.  Left forearm and elbow X rays done which showed soft tissue swelling posterior to the elbow but was negative for fracture. He was treated with Keflex and edema improving with low suspicion of septic arthritis. He continued to have left elbow pain and gout questioned therefore colchicine added as well as IV solumedrol X 3 days which completes today.   He had recurrent drop in sodium with concerns of SIADH therefore treated with salt tabs, Samsca 10/28 as well as high dose lasix for past 3 days. Mentation improving with decrease in anxiety and hypertensive emergency resolved. He continues to have intermittent confusion, fear of falling, limited mobility with tremors/shakes with fatigue and fatigue with tremors and pain left elbow.Patient transferred to CIR on 03/01/2021 .   Patient currently requires min with mobility secondary to muscle weakness, decreased cardiorespiratoy endurance, and decreased standing balance and decreased balance strategies.  Prior to hospitalization, patient was independent  with mobility and lived with Spouse in a  House home.  Home access is 4Stairs to enter.  Patient will benefit from skilled PT intervention to maximize safe functional mobility, minimize fall risk, and decrease caregiver burden for planned discharge home with  intermittent assist.  Anticipate patient will benefit from follow up OP at discharge.  PT - End of Session Activity Tolerance: Tolerates 30+ min activity with multiple rests Endurance Deficit: Yes PT Assessment Rehab Potential (ACUTE/IP ONLY): Good PT Patient demonstrates impairments in the following area(s): Balance;Motor;Pain;Safety PT Transfers Functional Problem(s): Bed Mobility;Bed to Chair;Car;Furniture PT Locomotion Functional Problem(s): Ambulation;Stairs PT Plan PT Intensity: Minimum of 1-2 x/day ,45 to 90 minutes PT Frequency: 5 out of 7 days PT Duration Estimated Length of Stay: ~7 days PT Treatment/Interventions: Ambulation/gait training;Community reintegration;DME/adaptive equipment instruction;Neuromuscular re-education;Psychosocial support;Stair training;UE/LE Strength taining/ROM;Balance/vestibular training;Discharge planning;Functional electrical stimulation;Pain management;Skin care/wound management;Therapeutic Activities;UE/LE Coordination activities;Cognitive remediation/compensation;Disease management/prevention;Functional mobility training;Patient/family education;Splinting/orthotics;Therapeutic Exercise;Visual/perceptual remediation/compensation PT Transfers Anticipated Outcome(s): Supervision PT Locomotion Anticipated Outcome(s): Supervision PT Recommendation Follow Up Recommendations: Outpatient PT Patient destination: Home   PT Evaluation Precautions/Restrictions Precautions Precautions: Fall;Other (comment) Precaution Comments: monitor O2, fearful of falling Restrictions Weight Bearing Restrictions: No General Chart Reviewed: Yes Family/Caregiver Present: Yes Pain Pain Assessment Pain Score: 0-No pain Pain Interference Pain Interference Pain Effect on Sleep: 1. Rarely or not at all Pain Interference with Therapy Activities: 1. Rarely or not at all Pain Interference with Day-to-Day Activities: 1. Rarely or not at all Home Living/Prior  [place] Available Help at Discharge: Family;Available 24 hours/day Type of Home: House Home Access: Stairs to enter [employer] of Steps: 4 Entrance Stairs-Rails: Right;Left;Can reach both Home Layout: One level Bathroom Accessibility: Yes  Lives With: Spouse Prior Function Level of Independence: Independent with basic ADLs;Independent with homemaking with ambulation;Independent with gait  Able to Take Stairs?: Yes Driving: Yes Vision/Perception  Vision - History Ability to See in Adequate Light: 0 Adequate Perception Perception: Within Functional Limits Praxis Praxis: Intact  Cognition Overall Cognitive Status: Within Functional Limits for tasks assessed Arousal/Alertness: Awake/alert Orientation Level: Oriented X4 Safety/Judgment: Impaired Sensation Sensation Light Touch: Appears Intact Coordination Gross Motor Movements are Fluid and Coordinated: Yes Fine Motor Movements are Fluid and Coordinated: Yes Motor  Motor Motor: Within Functional Limits  Trunk/Postural Assessment  Cervical Assessment Cervical Assessment:  (forward head) Thoracic Assessment Thoracic Assessment:  (rounded shoulders) Lumbar Assessment Lumbar Assessment:  (posterior pelvic tilt)  Balance Standardized Balance Assessment Standardized Balance Assessment: Berg Balance Test Berg Balance Test Sit to Stand: Able to stand  independently using hands Standing Unsupported: Able to stand 2 minutes with supervision Sitting with Back Unsupported but Feet Supported on Floor or Stool: Able to sit safely and securely 2 minutes Stand to Sit: Controls descent by using hands Transfers: Able to transfer with verbal cueing and /or supervision Standing Unsupported with Eyes Closed: Able to stand 10 seconds with supervision Standing Ubsupported with Feet Together: Able to place feet together independently and stand for 1 minute with supervision From Standing, Reach Forward with  Outstretched Arm: Reaches forward but needs supervision From Standing Position, Pick up Object from Floor: Able to pick up shoe, needs supervision From Standing Position, Turn to Look Behind Over each Shoulder: Turn sideways only but maintains balance Turn 360 Degrees: Needs close supervision or verbal cueing Standing Unsupported, Alternately Place Feet on Step/Stool: Needs assistance to keep from falling or unable to try Standing Unsupported, One Foot in Front: Able to plae foot ahead of the other independently and hold 30 seconds Standing on One Leg: Tries to lift  leg/unable to hold 3 seconds but remains standing independently Total Score: 32 Static Sitting Balance Static Sitting - Balance Support: Feet supported Static Sitting - Level of Assistance: 5: Stand by assistance Dynamic Sitting Balance Dynamic Sitting - Balance Support: Feet supported Dynamic Sitting - Level of Assistance: 5: Stand by assistance Static Standing Balance Static Standing - Balance Support: During functional activity Static Standing - Level of Assistance: 4: Min assist Dynamic Standing Balance Dynamic Standing - Balance Support: During functional activity Dynamic Standing - Level of Assistance: 4: Min assist Extremity Assessment  RLE Assessment RLE Assessment: Exceptions to [facility] General Strength Comments: 5/5 but endurance impaired LLE Assessment LLE Assessment: Exceptions to [employer] General Strength Comments: 5/5 but endurance impaired  Care Tool Care Tool Bed Mobility Roll left and right activity   Roll left and right assist level: Supervision/Verbal cueing    Sit to lying activity   Sit to lying assist level: Supervision/Verbal cueing    Lying to sitting on side of bed activity   Lying to sitting on side of bed assist level: the ability to move from lying on the back to sitting on the side of the bed with no back support.: Supervision/Verbal cueing     Care Tool Transfers Sit to stand transfer   Sit  to stand assist level: Minimal Assistance - Patient > 75%    Chair/bed transfer   Chair/bed transfer assist level: Minimal Assistance - Patient > 75%     Toilet transfer   Assist Level: Minimal Assistance - Patient > 75%    Car transfer   Car transfer assist level: Minimal Assistance - Patient > 75%      Care Tool Locomotion Ambulation   Assist level: Minimal Assistance - Patient > 75% Assistive device: No Device Max distance: 150'  Walk 10 feet activity   Assist level: Minimal Assistance - Patient > 75% Assistive device: No Device   Walk 50 feet with 2 turns activity   Assist level: Minimal Assistance - Patient > 75% Assistive device: No Device  Walk 150 feet activity   Assist level: Minimal Assistance - Patient > 75% Assistive device: No Device  Walk 10 feet on uneven surfaces activity   Assist level: Minimal Assistance - Patient > 75%    Stairs   Assist level: Contact Guard/Touching assist Stairs assistive device: 2 hand rails Max number of stairs: 12  Walk up/down 1 step activity   Walk up/down 1 step (curb) assist level: Contact Guard/Touching assist Walk up/down 1 step or curb assistive device: 2 hand rails  Walk up/down 4 steps activity   Walk up/down 4 steps assist level: Contact Guard/Touching assist Walk up/down 4 steps assistive device: 2 hand rails  Walk up/down 12 steps activity   Walk up/down 12 steps assist level: Contact Guard/Touching assist Walk up/down 12 steps assistive device: 2 hand rails  Pick up small objects from floor   Pick up small object from the floor assist level: Contact Guard/Touching assist    Wheelchair Is the patient using a wheelchair?: No          Wheel 50 feet with 2 turns activity      Wheel 150 feet activity        Refer to Care Plan for [place] 1 PT Short Term Goal 1 (Week 1): STGs = LTGs  Recommendations for other services: None   Skilled Therapeutic Intervention Evaluation  completed (see details above and below) with education on PT  POC and goals and individual treatment initiated with focus on bed mobility, balance, transfers, ambulation, and stair training. Pt received supine in bed and agrees to therapy. No complaint of pain. Supine to sit with verbal cues on sequencing and positioning at EOB. Pt performs sit to stand transfer with minA due to instability on feet , requiring pt to be assisted back to a seated position. PT educates on forward weight shift and pt performs successfully on 2nd attempt, and steps to [facility] with minA and cues for sequencing. WC transport to gym. Pt performs car transfer with minA and cues for sequencing. Pt then completes ramp navigation and 150' ambulation without AD and with minA due to decreased balance, especially during turns. Following seated rest break, pt performs BERG balance test, as detailed below, indicating the pt is a high fall risk. Pt then completes x12 6" steps with bilateral hand rails and CGA, with cues for step sequencing. WC transport back to room. Pt left seated with alarm intact and all needs within reach.   Mobility Bed Mobility Bed Mobility: Sit to Supine;Supine to Sit Supine to Sit: Supervision/Verbal cueing Sit to Supine: Supervision/Verbal cueing Transfers Transfers: Sit to Stand;Stand Pivot Transfers Sit to Stand: Minimal Assistance - Patient > 75% Stand Pivot Transfers: Minimal Assistance - Patient > 75% Stand Pivot Transfer Details: Tactile cues for initiation;Verbal cues for sequencing;Verbal cues for technique;Tactile cues for weight shifting;Tactile cues for sequencing Transfer (Assistive device): None Locomotion  Gait Ambulation: Yes Gait Assistance: Minimal Assistance - Patient > 75% Gait Distance (Feet): 150 Feet Assistive device: None Gait Assistance Details: Verbal cues for gait pattern;Tactile cues for posture;Verbal cues for technique Gait Gait: Yes Gait Pattern: Impaired Gait Pattern:  Decreased stride length;Decreased trunk rotation Gait velocity: decreased Stairs / Additional Locomotion Stairs: Yes Stairs Assistance: Contact Guard/Touching assist Stair Management Technique: Two rails Number of Stairs: 12 Height of Stairs: 6 Ramp: Minimal Assistance - Patient >75% Curb: Nurse, mental health Mobility: No   Discharge Criteria: Patient will be discharged from PT if patient refuses treatment 3 consecutive times without medical reason, if treatment goals not met, if there is a change in medical status, if patient makes no progress towards goals or if patient is discharged from hospital.  The above assessment, treatment plan, treatment alternatives and goals were discussed and mutually agreed upon: by patient  Breck Coons, PT, DPT 03/02/2021, 4:03 PM

## 2021-03-02 NOTE — Evaluation (Signed)
Occupational Therapy Assessment and Plan  Patient Details  Name: Colin Stevens MRN: [id number] Date of Birth: 1970-08-30  OT Diagnosis: abnormal posture, cognitive deficits, and muscle weakness (generalized) Rehab Potential: Rehab Potential (ACUTE ONLY): Good ELOS: 5-7  EVAL Today's Date: 03/02/2021 OT Individual Time: 0800-0900 OT Individual Time Calculation (min): 60 min    Session 2: Today's Date: 03/02/2021 OT Individual Time: 1540-0867 OT Individual Time Calculation (min): 25 min    Hospital Problem: Principal Problem:   Debility Active Problems:   Fall (on) (from) other stairs and steps, subsequent encounter   Past Medical History:  Past Medical History:  Diagnosis Date   Essential hypertension 02/19/2021   Nicotine dependence, cigarettes, uncomplicated 61/95/0932   Past Surgical History:  Past Surgical History:  Procedure Laterality Date   TONSILLECTOMY      Assessment & Plan Clinical Impression:Pt is a 50 y.o. admitted 02/19/21 with SOB, falls at home. Workup for acute hypoxic respiratory failure secondary to acute on chronic CHF, HTN urgency, DTs. Brain MRI negative for acute abnormality; mild L5 L-side nerve compression. Pt with L elbow pain and redness; xray negative for fx; plan to treat empirically for gout and cellulitis. PMH includes HTN, substance abuse   Patient currently requires min with basic self-care skills secondary to muscle weakness, decreased cardiorespiratoy endurance, decreased memory, and decreased standing balance, decreased postural control, and decreased balance strategies.  Prior to hospitalization, patient could complete BADL/IADL with independent .  Patient will benefit from skilled intervention to increase independence with basic self-care skills and increase level of independence with iADL prior to discharge home with care partner.  Anticipate patient will require intermittent supervision and follow up home health.  OT - End of  Session Activity Tolerance: Tolerates 10 - 20 min activity with multiple rests Endurance Deficit: Yes OT Assessment Rehab Potential (ACUTE ONLY): Good OT Patient demonstrates impairments in the following area(s): Balance;Cognition;Endurance;Safety OT Basic ADL's Functional Problem(s): Grooming;Bathing;Dressing;Toileting OT Transfers Functional Problem(s): Toilet;Tub/Shower OT Additional Impairment(s): Fuctional Use of Upper Extremity OT Plan OT Intensity: Minimum of 1-2 x/day, 45 to 90 minutes OT Frequency: 5 out of 7 days OT Duration/Estimated Length of Stay: 5-7 OT Treatment/Interventions: Balance/vestibular training;Discharge planning;Pain management;Self Care/advanced ADL retraining;Therapeutic Activities;UE/LE Coordination activities;Visual/perceptual remediation/compensation;Therapeutic Exercise;Skin care/wound managment;Functional mobility training;Patient/family education;Disease mangement/prevention;Cognitive remediation/compensation;Community reintegration;DME/adaptive equipment instruction;Neuromuscular re-education;Psychosocial support;Splinting/orthotics;UE/LE Strength taining/ROM;Wheelchair propulsion/positioning OT Self Feeding Anticipated Outcome(s): no goal OT Basic Self-Care Anticipated Outcome(s): MOD I OT Toileting Anticipated Outcome(s): MOD I OT Bathroom Transfers Anticipated Outcome(s): MOD I toilet, S shower OT Recommendation Patient destination: Home Follow Up Recommendations: Other (comment) Equipment Recommended: Tub/shower seat;3 in 1 bedside comode;To be determined   OT Evaluation Precautions/Restrictions  Precautions Precautions: Fall;Other (comment) Precaution Comments: monitor O2, fearful of falling Restrictions Weight Bearing Restrictions: No General   Vital Signs  Pain Pain Assessment Pain Score: 0-No pain Home Living/Prior Functioning Home Living Family/patient expects to be discharged to:: Private residence Living Arrangements:  Spouse/significant other Available Help at Discharge: Family, Available 24 hours/day Type of Home: House Home Access: Stairs to enter [employer] of Steps: 4 Entrance Stairs-Rails: Right, Left, Can reach both Home Layout: One level Bathroom Shower/Tub: Tub/shower unit, Architectural technologist: Standard Bathroom Accessibility: Yes  Lives With: Spouse IADL History Homemaking Responsibilities: Yes Meal Prep Responsibility: Secondary Laundry Responsibility: Secondary Cleaning Responsibility: Secondary Bill Paying/Finance Responsibility: Secondary Shopping Responsibility: Secondary Child Care Responsibility: Secondary Current License: Yes Occupation: Full time employment Type of Occupation: [employer] Leisure and Hobbies: Immunologist toys; smoking meats Prior Function Level of  Independence: Independent with basic ADLs, Independent with homemaking with ambulation, Independent with gait  Able to Take Stairs?: Yes Driving: Yes Vision Baseline Vision/History: 1 Wears glasses Ability to See in Adequate Light: 0 Adequate Perception  Perception: Within Functional Limits Praxis Praxis: Intact Cognition Overall Cognitive Status: Within Functional Limits for tasks assessed Arousal/Alertness: Awake/alert Year: 2022 Month: November Day of Week: Correct Memory: Appears intact Awareness: Appears intact Problem Solving: Appears intact Safety/Judgment: Appears intact Sensation Sensation Light Touch: Appears Intact Coordination Gross Motor Movements are Fluid and Coordinated: Yes Fine Motor Movements are Fluid and Coordinated: Yes Motor  Motor Motor: Within Functional Limits  Trunk/Postural Assessment  Cervical Assessment Cervical Assessment:  (forward head) Thoracic Assessment Thoracic Assessment:  (rounded shoulders) Lumbar Assessment Lumbar Assessment:  (posterior pelvic tilt)  Balance Standardized Balance Assessment Standardized Balance Assessment: Berg  Balance Test Berg Balance Test Sit to Stand: Able to stand  independently using hands Standing Unsupported: Able to stand 2 minutes with supervision Sitting with Back Unsupported but Feet Supported on Floor or Stool: Able to sit safely and securely 2 minutes Stand to Sit: Controls descent by using hands Transfers: Able to transfer with verbal cueing and /or supervision Standing Unsupported with Eyes Closed: Able to stand 10 seconds with supervision Standing Ubsupported with Feet Together: Able to place feet together independently and stand for 1 minute with supervision From Standing, Reach Forward with Outstretched Arm: Reaches forward but needs supervision From Standing Position, Pick up Object from Floor: Able to pick up shoe, needs supervision From Standing Position, Turn to Look Behind Over each Shoulder: Turn sideways only but maintains balance Turn 360 Degrees: Needs close supervision or verbal cueing Standing Unsupported, Alternately Place Feet on Step/Stool: Needs assistance to keep from falling or unable to try Standing Unsupported, One Foot in Front: Able to plae foot ahead of the other independently and hold 30 seconds Standing on One Leg: Tries to lift leg/unable to hold 3 seconds but remains standing independently Total Score: 32 Static Sitting Balance Static Sitting - Balance Support: Feet supported Static Sitting - Level of Assistance: 5: Stand by assistance Dynamic Sitting Balance Dynamic Sitting - Balance Support: Feet supported Dynamic Sitting - Level of Assistance: 5: Stand by assistance Static Standing Balance Static Standing - Balance Support: During functional activity Static Standing - Level of Assistance: 4: Min assist Dynamic Standing Balance Dynamic Standing - Balance Support: During functional activity Dynamic Standing - Level of Assistance: 4: Min assist Extremity/Trunk Assessment RUE Assessment RUE Assessment: Within Functional Limits LUE Assessment LUE  Assessment: Within Functional Limits  Care Tool Care Tool Self Care Eating   Eating Assist Level: Set up assist    Oral Care    Oral Care Assist Level: Minimal Assistance - Patient > 75% (standing at sink)    Bathing   Body parts bathed by patient: Right arm;Left arm;Chest;Abdomen;Front perineal area;Buttocks;Right upper leg;Left upper leg;Right lower leg;Left lower leg;Face     Assist Level: Minimal Assistance - Patient > 75%    Upper Body Dressing(including orthotics)   What is the patient wearing?: Pull over shirt   Assist Level: Set up assist    Lower Body Dressing (excluding footwear)   What is the patient wearing?: Pants Assist for lower body dressing: Minimal Assistance - Patient > 75%    Putting on/Taking off footwear   What is the patient wearing?: Non-skid slipper socks Assist for footwear: Maximal Assistance - Patient 25 - 49%       Care Tool Toileting Toileting activity  Assist for toileting: Minimal Assistance - Patient > 75%     Care Tool Bed Mobility Roll left and right activity   Roll left and right assist level: Supervision/Verbal cueing    Sit to lying activity   Sit to lying assist level: Supervision/Verbal cueing    Lying to sitting on side of bed activity   Lying to sitting on side of bed assist level: the ability to move from lying on the back to sitting on the side of the bed with no back support.: Supervision/Verbal cueing     Care Tool Transfers Sit to stand transfer   Sit to stand assist level: Minimal Assistance - Patient > 75%    Chair/bed transfer   Chair/bed transfer assist level: Minimal Assistance - Patient > 75%     Toilet transfer   Assist Level: Minimal Assistance - Patient > 75%     Care Tool Cognition  Expression of Ideas and Wants Expression of Ideas and Wants: 4. Without difficulty (complex and basic) - expresses complex messages without difficulty and with speech that is clear and easy to understand  Understanding  Verbal and Non-Verbal Content Understanding Verbal and Non-Verbal Content: 4. Understands (complex and basic) - clear comprehension without cues or repetitions   Memory/Recall Ability Memory/Recall Ability : Current season;Location of own room;That he or she is in a hospital/hospital unit   Refer to Care Plan for [facility] OT Short Term Goal 1 (Week 1): STG=LTG d.t ELOS  Recommendations for other services: Therapeutic Recreation  Pet therapy, Stress management, and Outing/community reintegration   Skilled Therapeutic Intervention Session 1: Pt and wife present for initial evaluation and education provided on OT role/purpos, CIR, ELOS and POC. Pt agreeable to BADL at shower level with rest breaks provided PRN d/t fatigue. Pt completes mobility with RW and MIN A Overall. Pt completes bathing sit to stand with grab bar from TTB with VC for sequencing of tub transfer with anterior weight shift for transitional movements. Pt completes dressing at EOB with A to don socks and MIN A for standing balance/threading catheter bag into pant leg. Pt and wife with no questions at this time.   Session 2: Pt completes all mobilty with NO AD and no pain. Pt requires CGA-MIN A for 2 minor lateral LOB during mobility with VC for slower pace during turns and pausing before initiating walking when standing up. Pt completes regular bed transfer with supervision and CGA for step over tub transfer using grab bar. Pt reporting tub at home lower than current tub and may have shower seat at home. Exited session with pt seated in bed, exit alarm on and call light in reach   ADL ADL Grooming: Minimal assistance Where Assessed-Grooming: Standing at sink Upper Body Bathing: Supervision/safety Where Assessed-Upper Body Bathing: Shower Lower Body Bathing: Minimal assistance Where Assessed-Lower Body Bathing: Shower Upper Body Dressing: Setup Where Assessed-Upper Body Dressing: Edge of  bed Lower Body Dressing: Moderate assistance Where Assessed-Lower Body Dressing: Edge of bed Toileting: Minimal assistance Where Assessed-Toileting: Bedside Commode Toilet Transfer: Minimal assistance Toilet Transfer Method: Magazine features editor: Minimal assistance Social research officer, government Method: Heritage manager: Manufacturing systems engineer  Bed Mobility Bed Mobility: Sit to Supine;Supine to Sit Supine to Sit: Supervision/Verbal cueing Sit to Supine: Supervision/Verbal cueing Transfers Sit to Stand: Minimal Assistance - Patient > 75%   Discharge Criteria: Patient will be discharged from OT if patient refuses treatment 3 consecutive times without  medical reason, if treatment goals not met, if there is a change in medical status, if patient makes no progress towards goals or if patient is discharged from hospital.  The above assessment, treatment plan, treatment alternatives and goals were discussed and mutually agreed upon: by patient  Tonny Branch 03/02/2021, 5:07 PM

## 2021-03-02 NOTE — Progress Notes (Signed)
Inpatient Rehabilitation  Patient information reviewed and entered into eRehab system by Frankey Botting M. Enrika Aguado, M.A., CCC/SLP, PPS Coordinator.  Information including medical coding, functional ability and quality indicators will be reviewed and updated through discharge.    

## 2021-03-02 NOTE — Progress Notes (Signed)
Inpatient Rehabilitation Admission Medication Review by a Pharmacist  A complete drug regimen review was completed for this patient to identify any potential clinically significant medication issues.  High Risk Drug Classes Is patient taking? Indication by Medication  Antipsychotic Yes Compazine for N/V  Anticoagulant Yes Lovenox for VTE ppx  Antibiotic No   Opioid No   Antiplatelet No   Hypoglycemics/insulin No   Vasoactive Medication Yes Carvedilol for BP, lisinopril for BP Clonidine for withdrawal  Chemotherapy No   Other Yes Colchicine for gout Librium for alcohol withdrawal     Type of Medication Issue Identified Description of Issue Recommendation(s)  Drug Interaction(s) (clinically significant)     Duplicate Therapy     Allergy     No Medication Administration End Date     Incorrect Dose     Additional Drug Therapy Needed     Significant med changes from prior encounter (inform family/care partners about these prior to discharge).    Other       Clinically significant medication issues were identified that warrant physician communication and completion of prescribed/recommended actions by midnight of the next day:  No  Pharmacist comments: Watch for librium taper for alcohol withdrawal  Time spent performing this drug regimen review (minutes):  20 minutes   Elwin Sleight 03/02/2021 7:56 AM

## 2021-03-03 ENCOUNTER — Inpatient Hospital Stay (HOSPITAL_COMMUNITY)
Admission: RE | Admit: 2021-03-03 | Payer: MEDICAID | Source: Intra-hospital | Admitting: Physical Medicine & Rehabilitation

## 2021-03-03 DIAGNOSIS — K5901 Slow transit constipation: Secondary | ICD-10-CM

## 2021-03-03 DIAGNOSIS — E871 Hypo-osmolality and hyponatremia: Secondary | ICD-10-CM

## 2021-03-03 DIAGNOSIS — I10 Essential (primary) hypertension: Secondary | ICD-10-CM

## 2021-03-03 DIAGNOSIS — F1010 Alcohol abuse, uncomplicated: Secondary | ICD-10-CM

## 2021-03-03 MED ORDER — CHLORDIAZEPOXIDE HCL 5 MG PO CAPS
5.0000 mg | ORAL_CAPSULE | Freq: Two times a day (BID) | ORAL | Status: DC
  Administered 2021-03-03 – 2021-03-04 (×2): 5 mg via ORAL
  Filled 2021-03-03 (×3): qty 1

## 2021-03-03 MED ORDER — PROPRANOLOL HCL 20 MG PO TABS
20.0000 mg | ORAL_TABLET | Freq: Two times a day (BID) | ORAL | Status: DC | PRN
  Administered 2021-03-03: 20 mg via ORAL
  Filled 2021-03-03: qty 1

## 2021-03-03 MED ORDER — TRAZODONE HCL 50 MG PO TABS: 50.0000 mg | ORAL_TABLET | Freq: Every evening | ORAL | Status: DC | PRN

## 2021-03-03 MED ORDER — PROPRANOLOL HCL 20 MG PO TABS
20.0000 mg | ORAL_TABLET | Freq: Every day | ORAL | Status: DC
  Administered 2021-03-04 – 2021-03-06 (×3): 20 mg via ORAL
  Filled 2021-03-03 (×3): qty 1

## 2021-03-03 NOTE — Progress Notes (Signed)
Inpatient Rehabilitation Care Coordinator Assessment and Plan Patient Details  Name: Colin Stevens MRN: [id number] Date of Birth: [birth year]  Today's Date: 03/03/2021  Hospital Problems: Principal Problem:   Debility Active Problems:   Fall (on) (from) other stairs and steps, subsequent encounter  Past Medical History:  Past Medical History:  Diagnosis Date   Essential hypertension 02/19/2021   Nicotine dependence, cigarettes, uncomplicated 83/15/1761   Past Surgical History:  Past Surgical History:  Procedure Laterality Date   TONSILLECTOMY     Social History:  reports that he has been smoking cigarettes. He has been smoking an average of 1 pack per day. He has never used smokeless tobacco. He reports current alcohol use of about 77.0 standard drinks per week. He reports that he does not use drugs.  Family / Support Systems Marital Status: Married How Long?: 21 years Patient Roles: Spouse, Parent Spouse/Significant Other: [name] (wife): [phone] Children: 1 adult son (55 y.o) ; lives at home Other Supports: Mother and MIL Anticipated Caregiver: wife Ability/Limitations of Caregiver: Wife works from home and able to provide intermittent support; their son will be home and able to assist, as well as his mother and MIL Caregiver Availability: 24/7 Family Dynamics: Pt lives with his wife and their adult son  Social History Preferred language: English Religion: Patient Refused Cultural Background: Pt worked in Education officer, environmental as heavy Glass blower/designer Education: [place] - How often do you need to have someone help you when you read instructions, pamphlets, or other written material from your doctor or pharmacy?: Never Writes: Yes Employment Status: Unemployed Date Retired/Disabled/Unemployed: 01/2021- after injuring back was laid off from [employer] History/Current Legal Issues: Denies Guardian/Conservator: N/A    Abuse/Neglect Abuse/Neglect Assessment Can Be Completed: Yes Physical Abuse: Denies Verbal Abuse: Denies Sexual Abuse: Denies Exploitation of patient/patient's resources: Denies Self-Neglect: Denies  Patient response to: Social Isolation - How often do you feel lonely or isolated from those around you?: Never  Emotional Status Pt's affect, behavior and adjustment status: Pt in good spirits at time of visit Recent Psychosocial Issues: Denies Psychiatric History: Pt admits to hx of anxiety in which he takes medication prescribed by PCP (propranolol). Pt also reports he gets anxiety around large groups and when meeting new people. Substance Abuse History: Pt admits he quit smoking cigarettes at time of admission in which he has been smoking for 25 yrs. Smokes 1ppd. Pt reports he quit drinking alcohol at time of admission. Was drinking 3-4 beers  a day after work.  Patient / Family Perceptions, Expectations & Goals Pt/Family understanding of illness & functional limitations: Pt and wife have a general understanding of patient care needs Premorbid pt/family roles/activities: Independent Anticipated changes in roles/activities/participation: Assistance with ADLs/IADLs Pt/family expectations/goals: Pt goal is to build up endurance again without an assistive device and walking.  Community Resources Express Scripts: None Premorbid Home Care/DME Agencies: None Transportation available at discharge: Wife Is the patient able to respond to transportation needs?: Yes In the past 12 months, has lack of transportation kept you from medical appointments or from getting medications?: No In the past 12 months, has lack of transportation kept you from meetings, work, or from getting things needed for daily living?: No Resource referrals recommended: Neuropsychology  Discharge Planning Living Arrangements: Spouse/significant other, Children Support Systems: Spouse/significant other, Children,  Parent, Other relatives Type of Residence: Private residence Insurance Resources: Teacher, adult education Resources: Family Support Financial Screen Referred: Yes Living Expenses: Mortgage Money Management: Patient, Spouse Does  the patient have any problems obtaining your medications?: No Home Management: Both managed home care needs Patient/Family Preliminary Plans: TBD Care Coordinator Barriers to Discharge: Insurance for SNF coverage Care Coordinator Barriers to Discharge Comments: Pt uninsured Care Coordinator Anticipated Follow Up Needs: HH/OP  Clinical Impression SW met with pt and pt wife in room to introduce self, explain role, and discuss discharge process. Pt is not a English as a second language teacher. No HCOPA. DME: rollator, and access to all other DME needs.   Kynzlie Hilleary A Loyda Costin 03/03/2021, 3:04 PM

## 2021-03-03 NOTE — Care Management (Signed)
Inpatient Rehabilitation Center Individual Statement of Services  Patient Name:  [name]  Date:  03/03/2021  Welcome to the Inpatient Rehabilitation Center.  Our goal is to provide you with an individualized program based on your diagnosis and situation, designed to meet your specific needs.  With this comprehensive rehabilitation program, you will be expected to participate in at least 3 hours of rehabilitation therapies Monday-Friday, with modified therapy programming on the weekends.  Your rehabilitation program will include the following services:  Physical Therapy (PT), Occupational Therapy (OT), Speech Therapy (ST), 24 hour per day rehabilitation nursing, Therapeutic Recreaction (TR), Psychology, Neuropsychology, Care Coordinator, Rehabilitation Medicine, Nutrition Services, Pharmacy Services, and Other  Weekly team conferences will be held on Tuesdays to discuss your progress.  Your Inpatient Rehabilitation Care Coordinator will talk with you frequently to get your input and to update you on team discussions.  Team conferences with you and your family in attendance may also be held.  Expected length of stay: 5-7 days    Overall anticipated outcome: Supervision to Independent with an Assistive Device  Depending on your progress and recovery, your program may change. Your Inpatient Rehabilitation Care Coordinator will coordinate services and will keep you informed of any changes. Your Inpatient Rehabilitation Care Coordinator's name and contact numbers are listed  below.  The following services may also be recommended but are not provided by the Inpatient Rehabilitation Center:  Driving Evaluations Home Health Rehabiltiation Services Outpatient Rehabilitation Services Vocational Rehabilitation   Arrangements will be made to provide these services after discharge if needed.  Arrangements include referral to agencies that provide these services.  Your insurance has been verified to  be:  Uninsured  Your primary doctor is:  Turkey Rankins  Pertinent information will be shared with your doctor and your insurance company.  Inpatient Rehabilitation Care Coordinator:  Susie Cassette [phone] or [phone]  Information discussed with and copy given to patient by: Gretchen Short, 03/03/2021, 9:45 AM

## 2021-03-03 NOTE — Progress Notes (Signed)
  Occupational Therapy Session Note  Patient Details  Name: Colin Stevens MRN: [id number] Date of Birth: [birth year]  Today's Date: 03/03/2021 Session 1 OT Individual Time: 1443-1540 OT Individual Time Calculation (min): 60 min   Session 2 OT Individual Time: 1302-1400 OT Individual Time Calculation (min): 58 min   Short Term Goals: Week 1:  OT Short Term Goal 1 (Week 1): STG=LTG d.t ELOS  Skilled Therapeutic Interventions/Progress Updates:    Session 1 Pt greeted semi-reclined in bed with spouse present and agreeable to OT treatment session. Pt declined need to shower or perform BADL tasks at this time. Pt sat EOB and donned shoes with set-up A. HE then ambulated to wc with  min A> Pt brought down to dayroom and addressed standing balance and endurance with standing Wii bowling activity. Pt tolerated standing for almost 10 minutes. Also increased balance challenge by having pt step with swing through to bowl ball, min A for balance without AD. Addressed balance strategies using Biodex in static and dynamic 12 setting. Pt returned to room and ambulated back to bed with no AD and min A. Pt doffed shoes and returned to supine with supervision. Pt left in bed with alarm on, call bell in reach, and spouse present.  No pain  Session 2 Pt greeted semi-reclined in bed with spouse present and agreeable to OT treatment session. Pt donned shoes at EOB, then ambulated to wc with CGA. Pt brought down to therapy gym and completed UB there-ex using 4 lb dowel rod standing, then standing on foam block- 4 sets of 10, chest press, bicep curl, standing row, straight arm raise. Pt needed extended rest breaks in between sets. Pt complete 5 mins forward and 5 backwards on Sci[employer] arm bike on level 4. Pt returned to room and pivoted back to bed with CGA. Pt left semi-reclined in bed with bed alarm on, call bell in reach, and needs met.   Therapy Documentation Precautions:  Precautions Precautions: Fall, Other  (comment) Precaution Comments: monitor O2, fearful of falling Restrictions Weight Bearing Restrictions: No Pain: Pain Assessment Pain Scale: 0-10 Pain Score: 0-No pain    Therapy/Group: Individual Therapy  Valma Cava 03/03/2021, 2:04 PM

## 2021-03-03 NOTE — Progress Notes (Signed)
PROGRESS NOTE   Subjective/Complaints: Pt had a good day with therapy. Struggled a bit to get comfortable last night.   ROS: Patient denies fever, rash, sore throat, blurred vision, nausea, vomiting, diarrhea, cough, shortness of breath or chest pain,   headache, or mood change.    Objective:   No results found. Recent Labs    03/01/21 0159 03/02/21 0558  WBC 6.5 5.6  HGB 15.0 15.2  HCT 45.2 43.9  PLT 256 261   Recent Labs    03/01/21 0159 03/02/21 0558  NA 134* 132*  K 4.2 3.5  CL 90* 92*  CO2 35* 33*  GLUCOSE 108* 92  BUN 16 12  CREATININE 0.78 0.78  CALCIUM 9.5 9.1   No intake or output data in the 24 hours ending 03/03/21 1054      Physical Exam: Vital Signs Blood pressure (!) 146/85, pulse 90, temperature 97.8 F (36.6 C), resp. rate 18, height 6\' 4"  (1.93 m), weight (!) 140.9 kg, SpO2 96 %.  General: Alert and oriented x 3, No apparent distress HEENT: Head is normocephalic, atraumatic, PERRLA, EOMI, sclera anicteric, oral mucosa pink and moist, dentition intact, ext ear canals clear,  Neck: Supple without JVD or lymphadenopathy Heart: Reg rate and rhythm. No murmurs rubs or gallops Chest: CTA bilaterally without wheezes, rales, or rhonchi; no distress Abdomen: Soft, non-tender, protuberant, bowel sounds positive. Extremities: No clubbing, cyanosis, or edema. Pulses are 2+ Psych: Pt's affect is appropriate. Pt is cooperative Skin: a few scattered abrasions and bruises Neuro:  Alert and oriented x 3. Normal insight and awareness. Intact Memory. Normal language and speech. Cranial nerve exam unremarkable. UE 5/5 prox to distal. LE 4/5 prox to 5/5 distally. No focal sensory ifindings.  Musculoskeletal: Full ROM, No pain with AROM or PROM in the neck, trunk, or extremities. Posture appropriate     Assessment/Plan: 1. Functional deficits which require 3+ hours per day of interdisciplinary therapy in a  comprehensive inpatient rehab setting. Physiatrist is providing close team supervision and 24 hour management of active medical problems listed below. Physiatrist and rehab team continue to assess barriers to discharge/monitor patient progress toward functional and medical goals  Care Tool:  Bathing    Body parts bathed by patient: Right arm, Left arm, Chest, Abdomen, Front perineal area, Buttocks, Right upper leg, Left upper leg, Right lower leg, Left lower leg, Face         Bathing assist Assist Level: Minimal Assistance - Patient > 75%     Upper Body Dressing/Undressing Upper body dressing   What is the patient wearing?: Pull over shirt    Upper body assist Assist Level: Set up assist    Lower Body Dressing/Undressing Lower body dressing      What is the patient wearing?: Pants     Lower body assist Assist for lower body dressing: Minimal Assistance - Patient > 75%     Toileting Toileting    Toileting assist Assist for toileting: Minimal Assistance - Patient > 75%     Transfers Chair/bed transfer  Transfers assist     Chair/bed transfer assist level: Minimal Assistance - Patient > 75%     Locomotion Ambulation  Ambulation assist      Assist level: Minimal Assistance - Patient > 75% Assistive device: No Device Max distance: 150'   Walk 10 feet activity   Assist     Assist level: Minimal Assistance - Patient > 75% Assistive device: No Device   Walk 50 feet activity   Assist    Assist level: Minimal Assistance - Patient > 75% Assistive device: No Device    Walk 150 feet activity   Assist    Assist level: Minimal Assistance - Patient > 75% Assistive device: No Device    Walk 10 feet on uneven surface  activity   Assist     Assist level: Minimal Assistance - Patient > 75%     Wheelchair     Assist Is the patient using a wheelchair?: No             Wheelchair 50 feet with 2 turns activity    Assist             Wheelchair 150 feet activity     Assist          Blood pressure (!) 146/85, pulse 90, temperature 97.8 F (36.6 C), resp. rate 18, height 6\' 4"  (1.93 m), weight (!) 140.9 kg, SpO2 96 %.  Medical Problem List and Plan: 1.  Debility secondary to complicated medical issues including CHF exacerbation and newly dx'd cirrhosis             -patient may  shower             -ELOS/Goals: 10-14 days- supervision to min A  -Continue CIR therapies including PT, OT, and SLP  2.  Antithrombotics: -DVT/anticoagulation:  Pharmaceutical: Lovenox             -antiplatelet therapy: N/A 3. Pain Management: Tylenol prn.  4. Mood: LCSW to follow for evaluation and support. Used propranolol tid prn for anxiety-->resumed.              -antipsychotic agents: N/A 5. Neuropsych: This patient is not fully capable of making decisions on his own behalf. 6. Skin/Wound Care: Routine pressure relief measures.              --monitor elbow for any changes.  7. Fluids/Electrolytes/Nutrition: Monitor I/O. Check lytes in am. 8. ETOH abuse/DT: Librium being slowly tapered--reduce to  5 mg BID             --Continue thiamine 100 mg daily and cyanocobalamin 1000 mg daily             --Folic acid 1 mg daily  -voiced desire to give up etoh and tobacco 9. HTN: Monitor BP tid--on Zestril. Resumed low dose catapres bid-- -bp controlled 10. Left elbow erythema/pain/edema: Completed 4 day course of Keflex per ortho             --On colchicine bid with solumedrol taper -->completed.   11.  Hyponatremia: Has improved from 128-->134-->132.  -Continue salt tabs.             --continue 1500 cc FR.  12. Hypomagnesemia:  wnl --continue oral supplement.  13. Constipation:   Senna S- had bm 11/1   LOS: 2 days A FACE TO FACE EVALUATION WAS PERFORMED  13/1 03/03/2021, 10:54 AM

## 2021-03-03 NOTE — Progress Notes (Signed)
Physical Therapy Session Note  Patient Details  Name: Colin Stevens MRN: [id number] Date of Birth: 17-Jul-1970  Today's Date: 03/03/2021 PT Individual Time: 0800-0900 PT Individual Time Calculation (min): 60 min   Short Term Goals: Week 1:  PT Short Term Goal 1 (Week 1): STGs = LTGs  Skilled Therapeutic Interventions/Progress Updates:     Pt initially supine.  Supine to sit w/supervision.  Dons socks w/supervision in sitting including leaning to floor mult times.  Dons shoes w/set up. stand pivot transfer to wc w/cga, additional time to adjust to upright, increased sway in standing.  Wc propulsion using Les x 146ft for general strengthening, cardiovascular warmup HR 103      97%  Gait >482ft w/overall min to cga, inconsistent cadence, increased lateral trunk excursions, mild lob x 3 w/min assist for recovery, occurs when pt distracted from task. 100-106, irregular when palpated    97%  10X sit to stand using Ues = 70sec to perform, pt rates task as "hard" H4 107, 02 sats 97%  Alternating forward lunges x 10 w/cga, performs slowly,instructed to focus on stability w/task. Pt rates task as "harder than it looks"   Alternating lateral lunges x 10 w/cga, performs as above  Static balance: Standing w/feet together - cga, mild sway " " Added looking up to horizontal - cga, mild increase in sway ""  Added looking down "" " " Added rotation L then R - cga to min assist, significant sway but pt able to stabilize w/cga and controls rate of movement to avoid excessive sway/lob.  Wc propulsion x 31ft w/bilat UE for general strengthening, denies elbow discomfort w/task.  Gait x 136ft w/head nods and rotation w/min assist, decreased balance w/challenge. Pt left supine w/rails up x 3, alarm set, bed in lowest position, and needs in reach.     Therapy Documentation Precautions:  Precautions Precautions: Fall, Other (comment) Precaution Comments: monitor O2, fearful of  falling Restrictions Weight Bearing Restrictions: No     Therapy/Group: Individual Therapy Rada Hay, PT   Shearon Balo 03/03/2021, 12:18 PM

## 2021-03-03 NOTE — Progress Notes (Signed)
Removed Right FA IV per Pam. Pt tolerated well. Catheter clean and intact.   Talicia Sui [place]

## 2021-03-04 ENCOUNTER — Other Ambulatory Visit (HOSPITAL_COMMUNITY): Payer: Self-pay

## 2021-03-04 LAB — BASIC METABOLIC PANEL
Anion gap: 7 (ref 5–15)
BUN: 10 mg/dL (ref 6–20)
CO2: 29 mmol/L (ref 22–32)
Calcium: 9.1 mg/dL (ref 8.9–10.3)
Chloride: 97 mmol/L — ABNORMAL LOW (ref 98–111)
Creatinine, Ser: 0.65 mg/dL (ref 0.61–1.24)
GFR, Estimated: >60 mL/min (ref >60)
Glucose, Bld: 89 mg/dL (ref 70–99)
Potassium: 3.6 mmol/L (ref 3.5–5.1)
Sodium: 133 mmol/L — ABNORMAL LOW (ref 135–145)

## 2021-03-04 MED ORDER — CHLORDIAZEPOXIDE HCL 5 MG PO CAPS
5.0000 mg | ORAL_CAPSULE | Freq: Two times a day (BID) | ORAL | Status: AC
  Administered 2021-03-04 – 2021-03-06 (×4): 5 mg via ORAL
  Filled 2021-03-04 (×4): qty 1

## 2021-03-04 MED ORDER — COLCHICINE 0.6 MG PO TABS
0.6000 mg | ORAL_TABLET | Freq: Every day | ORAL | 0 refills | Status: AC
  Filled 2021-03-04: qty 30, 30d supply, fill #0

## 2021-03-04 MED ORDER — LOPERAMIDE HCL 2 MG PO CAPS
2.0000 mg | ORAL_CAPSULE | Freq: Four times a day (QID) | ORAL | 0 refills | Status: DC | PRN
  Filled 2021-03-04: qty 30, 8d supply, fill #0

## 2021-03-04 MED ORDER — FOLIC ACID 1 MG PO TABS
1.0000 mg | ORAL_TABLET | Freq: Every day | ORAL | 0 refills | Status: DC
  Filled 2021-03-04: qty 30, 30d supply, fill #0

## 2021-03-04 MED ORDER — DICLOFENAC SODIUM 1 % EX GEL
2.0000 g | Freq: Four times a day (QID) | CUTANEOUS | 0 refills | Status: DC
  Filled 2021-03-04: qty 400, 30d supply, fill #0

## 2021-03-04 MED ORDER — CARVEDILOL 12.5 MG PO TABS
12.5000 mg | ORAL_TABLET | Freq: Two times a day (BID) | ORAL | 0 refills | Status: DC
  Filled 2021-03-04: qty 60, 30d supply, fill #0

## 2021-03-04 MED ORDER — NYSTATIN 100000 UNIT/ML MT SUSP
5.0000 mL | Freq: Four times a day (QID) | OROMUCOSAL | 0 refills | Status: DC
  Filled 2021-03-04: qty 60, 3d supply, fill #0

## 2021-03-04 MED ORDER — CLONIDINE HCL 0.1 MG PO TABS
0.1000 mg | ORAL_TABLET | Freq: Two times a day (BID) | ORAL | 0 refills | Status: DC
  Filled 2021-03-04: qty 60, 30d supply, fill #0

## 2021-03-04 MED ORDER — PANTOPRAZOLE SODIUM 40 MG PO TBEC
40.0000 mg | DELAYED_RELEASE_TABLET | Freq: Every day | ORAL | 0 refills | Status: DC
  Filled 2021-03-04: qty 30, 30d supply, fill #0

## 2021-03-04 MED ORDER — CYANOCOBALAMIN 1000 MCG PO TABS
1000.0000 ug | ORAL_TABLET | Freq: Every day | ORAL | 0 refills | Status: AC
  Filled 2021-03-04: qty 30, 30d supply, fill #0

## 2021-03-04 MED ORDER — SENNOSIDES-DOCUSATE SODIUM 8.6-50 MG PO TABS
2.0000 | ORAL_TABLET | Freq: Every day | ORAL | 0 refills | Status: DC
  Filled 2021-03-04: qty 60, 30d supply, fill #0

## 2021-03-04 MED ORDER — LISINOPRIL 5 MG PO TABS
5.0000 mg | ORAL_TABLET | Freq: Every day | ORAL | 0 refills | Status: AC
  Filled 2021-03-04: qty 30, 30d supply, fill #0

## 2021-03-04 MED ORDER — THIAMINE HCL 100 MG PO TABS
100.0000 mg | ORAL_TABLET | Freq: Every day | ORAL | 0 refills | Status: DC
  Filled 2021-03-04: qty 30, 30d supply, fill #0

## 2021-03-04 MED ORDER — PROPRANOLOL HCL 20 MG PO TABS
20.0000 mg | ORAL_TABLET | Freq: Three times a day (TID) | ORAL | 0 refills | Status: DC | PRN
  Filled 2021-03-04: qty 70, 24d supply, fill #0

## 2021-03-04 MED ORDER — MAGNESIUM OXIDE 400 MG PO TABS
400.0000 mg | ORAL_TABLET | Freq: Every day | ORAL | 0 refills | Status: AC
  Filled 2021-03-04: qty 30, 30d supply, fill #0

## 2021-03-04 NOTE — Progress Notes (Signed)
Physical Therapy Session Note  Patient Details  Name: Colin Stevens MRN: [id number] Date of Birth: [birth year]  Today's Date: 03/04/2021 PT Individual Time: 1105-1200 and 3875-6433 PT Individual Time Calculation (min): 55 min and 24 min  Short Term Goals: Week 1:  PT Short Term Goal 1 (Week 1): STGs = LTGs  Skilled Therapeutic Interventions/Progress Updates:      1st Session: Pt received supine in bed and agrees to therapy. No complaint of pain. Supine to sit independent. Pt dons shoes with setup assistance and performs stand pivot transfer to [facility] with cues for body mechanics and sequencing. Wife present for family education. WC transport to gym. Pt performs car transfer without AD, with verbal cues on sequencing and positioning. Pt then ambulates up ramp, over mulch, and up/down step with CGA and cues to increase step height for safety. Following seated rest break, pt performs x4 6" steps with bilateral hand rails and CGA from PT, with PT educating wife on proper positioning and hand placement. Pt performs additional x4 steps with wife providing assistance and pt only utilizing L hand rail.   Pt performs x12:00 on Nustep at workload of 5 with average steps per minute ~50. Pt completes activity for strength and endurance training.  Pt performs transfer training with strengthening component. PT demonstrates sit-to-stand without use of upper extremities, and explains importance of momentum and anterior weight transition to center gravity over BOS. Pt then completes x5 reps with CGA.  WC transport back to room. Pt left seated at EOB with alarm intact and all needs within reach.  2nd Session: Pt received supine in bed and agrees to therapy. No complaint of pain. Bed mobility independent. Pt dons shoes with setup assistance. Sit to stand with supervision and cues for body mechanics. Pt ambulates from room on 5th floor to 4th floor gym, >400', including ambulating down 24 steps with L hand rail. PT  provides CGA for stairs with cues for step sequencing, and verbal cues and supervision for ambulation, with cues to maintain upright gaze, increase stride length and gait speed to decrease risk for falls. Pt performs NMR for standing balance, with addition of strengthening and transfer training. Pt holds onto 2 kg medicine ball and performs 2x5 reps of sit to stand, facilitating anterior weight shift and proper body mechanics. In standing pt toss ball at trampoline and catches rebound to challenge dynamic balance and postural adjustments. Pt then ambulates back to room, using elevator, with cues for navigation and increasing stride length. Left seated at EOB with alarm intact and all needs within reach.  Therapy Documentation Precautions:  Precautions Precautions: Fall Precaution Comments: monitor O2, fearful of falling Restrictions Weight Bearing Restrictions: No   Therapy/Group: Individual Therapy  Beau Fanny, PT, DPT 03/04/2021, 3:48 PM

## 2021-03-04 NOTE — Progress Notes (Signed)
Occupational Therapy Session Note  Patient Details  Name: Colin Stevens MRN: [id number] Date of Birth: 08-23-1970  Today's Date: 03/04/2021 Session 1 OT Individual Time: 2761-8485 OT Individual Time Calculation (min): 55 min   Session 2 OT Individual Time: 9276-3943 OT Individual Time Calculation (min): 75 min    Short Term Goals: Week 1:  OT Short Term Goal 1 (Week 1): STG=LTG d.t ELOS  Skilled Therapeutic Interventions/Progress Updates:    Session 1 Pt greeted semi-reclined in bed and agreeable to OT treatment session. Pt's spouse joined for pt/family education. Pt ambulated down hallway, into elevator, and to therapy apartment. Practiced sit<>stands from lower recliner surface with supervision. OT placed bariatric tub bench in tub shower and pt able to complete tub transfer with supervision. Discussed home set-up, DME needs, and safe participation of BADL tasks. Tried drop arm BSC as this allows more room on the sides for pt body habitus. Pt's spouse to try to get this DME. Pt then ambulated to therapy gym and worked on dynamic standing balance standing on foam block while participating in [employer] activity. CGA to step up onto surface, then supervision for dynamic balance. Pt ambulated back to room with supervision and left semi-reclined in bed with needs met. DENIES PAIN  Session 2 Pt greeted semi-reclined in bed with spouse present and agreeable to OT treatment session. Pt ambulated from room, to dayroom with supervision, no AD, and no LOB. OT issued upper body home exercise program and therabnd. Pt completed 10 reps of each exercise with rest breaks in between and demonstration for body mechanics. Pt in return demonstrated great form and understanding of there-ex. OT discussed ways to increase activity level at home as well as energy conservation techniques. Pt then completed 8 mins on level 4 on NuStep for general strength and endurance. Pt took rest break after therapeutic  exercise, then ambulated back to the room in similar fashion. Pt returned to bed mod I and left semi-reclined with spouse present and needs met.  Therapy Documentation Precautions:  Precautions Precautions: Fall Precaution Comments: monitor O2, fearful of falling Restrictions Weight Bearing Restrictions: No  Pain:  DENIES PAIN   Therapy/Group: Individual Therapy  Valma Cava 03/04/2021, 3:11 PM

## 2021-03-04 NOTE — Discharge Instructions (Addendum)
Inpatient Rehab Discharge Instructions  MANDEL SEIDEN Discharge date and time: 03/06/21   Activities/Precautions/ Functional Status: Activity: no lifting, driving, or strenuous exercise till cleared by MD Diet: regular diet Limit fluids to 2 liters/day Wound Care: none needed   Functional status:  ___ No restrictions     ___ Walk up steps independently ___ 24/7 supervision/assistance   ___ Walk up steps with assistance _X__ Intermittent supervision/assistance  ___ Bathe/dress independently ___ Walk with walker     ___ Bathe/dress with assistance ___ Walk Independently    ___ Shower independently ___ Walk with assistance    _X__ Shower with assistance _X__ No alcohol     ___ Return to work/school ________    Special Instructions:    My questions have been answered and I understand these instructions. I will adhere to these goals and the provided educational materials after my discharge from the hospital.  Patient/Caregiver Signature _______________________________ Date __________  Clinician Signature _______________________________________ Date __________  Please bring this form and your medication list with you to all your follow-up doctor's appointments.

## 2021-03-04 NOTE — Progress Notes (Signed)
Occupational Therapy Discharge Summary  Patient Details  Name: Colin Stevens MRN: [id number] Date of Birth: 21-Mar-1971  Patient has met 9 of 9 long term goals due to improved activity tolerance, improved balance, postural control, and ability to compensate for deficits.  Patient to discharge at overall Mod I/ Supervision level.  Patient's care partner is independent to provide the necessary physical assistance at discharge.    Reasons goals not met: n/a  Recommendation:  Patient will benefit from ongoing skilled OT services in  no OT follow up needed. Pt received home exercise program.     Equipment: Pt's spouse to order bariatric tub transfer bench and drop arm BSC privately   Reasons for discharge: treatment goals met and discharge from hospital  Patient/family agrees with progress made and goals achieved: Yes  OT Discharge Precautions/Restrictions  Precautions Precautions: Fall Restrictions Weight Bearing Restrictions: No Pain  Denies pain ADL ADL Eating: Independent Grooming: Independent Where Assessed-Grooming: Standing at sink Upper Body Bathing: Setup Where Assessed-Upper Body Bathing: Shower Lower Body Bathing: Setup Where Assessed-Lower Body Bathing: Shower Upper Body Dressing: Modified independent (Device) Where Assessed-Upper Body Dressing: Edge of bed Lower Body Dressing: Modified independent Where Assessed-Lower Body Dressing: Edge of bed Toileting: Modified independent Where Assessed-Toileting: Bedside Commode Toilet Transfer: Modified independent Toilet Transfer Method: Ambulating Tub/Shower Transfer: Close supervison Social research officer, government: Minimal assistance Social research officer, government Method: Heritage manager: Special educational needs teacher  Perception: Within Functional Limits Praxis Praxis: Intact Cognition Overall Cognitive Status: Within Functional Limits for tasks assessed Arousal/Alertness: Awake/alert Orientation Level:  Oriented X4 Year: 2022 Month: November Day of Week: Correct Memory: Appears intact Immediate Memory Recall: Sock;Blue;Bed Memory Recall Sock: Without Cue Memory Recall Blue: Without Cue Memory Recall Bed: Without Cue Awareness: Appears intact Problem Solving: Appears intact Safety/Judgment: Appears intact Sensation Sensation Light Touch: Appears Intact Coordination Gross Motor Movements are Fluid and Coordinated: Yes Fine Motor Movements are Fluid and Coordinated: Yes Motor  Motor Motor: Within Functional Limits Mobility  Bed Mobility Supine to Sit: Independent Sit to Supine: Independent Transfers Sit to Stand: Independent with assistive device  Balance Static Sitting Balance Static Sitting - Balance Support: Feet supported Static Sitting - Level of Assistance: 7: Independent Dynamic Sitting Balance Dynamic Sitting - Balance Support: During functional activity;Feet supported Dynamic Sitting - Level of Assistance: 7: Independent Static Standing Balance Static Standing - Balance Support: During functional activity Static Standing - Level of Assistance: 6: Modified independent (Device/Increase time) Dynamic Standing Balance Dynamic Standing - Balance Support: During functional activity Dynamic Standing - Level of Assistance: 6: Modified independent (Device/Increase time) Extremity/Trunk Assessment RUE Assessment RUE Assessment: Within Functional Limits LUE Assessment LUE Assessment: Within Functional Limits   Daneen Schick Doe 03/04/2021, 12:57 PM

## 2021-03-04 NOTE — Progress Notes (Signed)
Patient ID: Colin Stevens, male   DOB: Dec 12, 1970, 50 y.o.   MRN: [id number]  SW met with pt and pt wife [name] to discuss d/c for Sunday (11/6) and DME recs: RW, DABSC. P Pt and wife amenable to Sunday d/c. Wife will continue to use current BSC to avoid potential bill for charity DME. SW informed no continued therapies needed at this time. Pt set up for [facility] medication assistance program.   Loralee Pacas, MSW, [place] Office: [phone] Cell: [phone] Fax: [phone]

## 2021-03-04 NOTE — IPOC Note (Signed)
Overall Plan of Care [facility]) Patient Details Name: ZYAN COBY MRN: [id number] DOB: 03-Oct-1970  Admitting Diagnosis: Debility  Hospital Problems: Principal Problem:   Debility Active Problems:   Fall (on) (from) other stairs and steps, subsequent encounter     Functional Problem List: Nursing Endurance, Medication Management, Motor, Pain, Safety  PT Balance, Motor, Pain, Safety  OT Balance, Cognition, Endurance, Safety  SLP    TR         Basic ADL's: OT Grooming, Bathing, Dressing, Toileting     Advanced  ADL's: OT       Transfers: PT Bed Mobility, Bed to Chair, Car, Occupational psychologist, Research scientist (life sciences): PT Ambulation, Stairs     Additional Impairments: OT Fuctional Use of Upper Extremity  SLP        TR      Anticipated Outcomes Item Anticipated Outcome  Self Feeding no goal  Swallowing      Basic self-care  MOD I  Toileting  MOD I   Bathroom Transfers MOD I toilet, S shower  Bowel/Bladder  n/a  Transfers  Supervision  Locomotion  Supervision  Communication     Cognition     Pain  <3  Safety/Judgment  supervision and no falls   Therapy Plan: PT Intensity: Minimum of 1-2 x/day ,45 to 90 minutes PT Frequency: 5 out of 7 days PT Duration Estimated Length of Stay: ~7 days OT Intensity: Minimum of 1-2 x/day, 45 to 90 minutes OT Frequency: 5 out of 7 days OT Duration/Estimated Length of Stay: 5-7 SLP Duration/Estimated Length of Stay: NA   Due to the current state of emergency, patients may not be receiving their 3-hours of Medicare-mandated therapy.   Team Interventions: Nursing Interventions Patient/Family Education, Disease Management/Prevention, Pain Management, Medication Management, Discharge Planning  PT interventions Ambulation/gait training, Community reintegration, DME/adaptive equipment instruction, Neuromuscular re-education, Psychosocial support, Stair training, UE/LE Strength taining/ROM, Designer, jewellery, Discharge planning, Functional electrical stimulation, Pain management, Skin care/wound management, Therapeutic Activities, UE/LE Coordination activities, Cognitive remediation/compensation, Disease management/prevention, Functional mobility training, Patient/family education, Splinting/orthotics, Therapeutic Exercise, Visual/perceptual remediation/compensation  OT Interventions Balance/vestibular training, Discharge planning, Pain management, Self Care/advanced ADL retraining, Therapeutic Activities, UE/LE Coordination activities, Visual/perceptual remediation/compensation, Therapeutic Exercise, Skin care/wound managment, Functional mobility training, Patient/family education, Disease mangement/prevention, Cognitive remediation/compensation, Community reintegration, Fish farm manager, Neuromuscular re-education, Psychosocial support, Splinting/orthotics, UE/LE Strength taining/ROM, Wheelchair propulsion/positioning  SLP Interventions    TR Interventions    SW/CM Interventions Discharge Planning, Psychosocial Support, Patient/Family Education   Barriers to Discharge MD  Medical stability  Nursing Decreased caregiver support, Lack of/limited family support, Home environment access/layout, Weight, Medication compliance Discharging to 1 level home with 4 steps to enter. Right and left rails. Discharging home with spouse who works from home and has no limitations. ETOH history, Nicotine dependent.  PT      OT      SLP      SW Insurance for SNF coverage Pt uninsured   Team Discharge Planning: Destination: PT-Home ,OT- Home , SLP-Home Projected Follow-up: PT-Outpatient PT, OT-  Other (comment), SLP-None Projected Equipment Needs: PT- , OT- Tub/shower seat, 3 in 1 bedside comode, To be determined, SLP-None recommended by SLP Equipment Details: PT- , OT-  Patient/family involved in discharge planning: PT- Patient, Family member/caregiver,  OT- , SLP-Patient, Family  member/caregiver  MD ELOS: 5 -7 days Medical Rehab Prognosis:  Excellent Assessment: The patient has been admitted for CIR therapies with the diagnosis of debility related to fall,  multiple medical. The team will be addressing functional mobility, strength, stamina, balance, safety, adaptive techniques and equipment, self-care, bowel and bladder mgt, patient and caregiver education, NMR, community reentry, pain mgt. Goals have been set at mod I for self-care and supervision for basic mobility.   Due to the current state of emergency, patients may not be receiving their 3 hours per day of Medicare-mandated therapy.    Ranelle Oyster, MD, FAAPMR     See Team Conference Notes for weekly updates to the plan of care

## 2021-03-04 NOTE — Progress Notes (Signed)
Inpatient Rehabilitation Discharge Medication Review by a Pharmacist  A complete drug regimen review was completed for this patient to identify any potential clinically significant medication issues.  High Risk Drug Classes Is patient taking? Indication by Medication  Antipsychotic No   Anticoagulant No   Antibiotic No   Opioid No   Antiplatelet No   Hypoglycemics/insulin No   Vasoactive Medication Yes Propranolol as needed for anxiety Clonidine for alcohol withdrawal Carvedilol, lisinopril for BP  Chemotherapy No   Other Yes Nystatin for thrush     Type of Medication Issue Identified Description of Issue Recommendation(s)  Drug Interaction(s) (clinically significant)     Duplicate Therapy     Allergy     No Medication Administration End Date     Incorrect Dose     Additional Drug Therapy Needed     Significant med changes from prior encounter (inform family/care partners about these prior to discharge).    Other       Clinically significant medication issues were identified that warrant physician communication and completion of prescribed/recommended actions by midnight of the next day:  No  Pharmacist comments: prior to admission Norvasc stopped  Time spent performing this drug regimen review (minutes):  20 minutes   Elwin Sleight 03/04/2021 12:52 PM

## 2021-03-04 NOTE — Discharge Summary (Signed)
Physician Discharge Summary  Patient ID: Colin Stevens MRN: [id number] DOB/AGE: [birth year] 50 y.o.  Admit date: 03/01/2021 Discharge date: 03/06/2021  Discharge Diagnoses:  Principal Problem:   Debility Active Problems:   Nicotine dependence, cigarettes, uncomplicated   Fall (on) (from) other stairs and steps, subsequent encounter   Essential hypertension   Alcohol abuse   Insomnia   Hyponatremia   Discharged Condition: good  Significant Diagnostic Studies: N/A   Labs:  Basic Metabolic Panel: Recent Labs  Lab 02/26/21 0110 02/27/21 0442 02/28/21 0118 03/01/21 0159 03/02/21 0558 03/04/21 0510  NA 136 134* 132* 134* 132* 133*  K 4.2 4.3 4.0 4.2 3.5 3.6  CL 99 95* 93* 90* 92* 97*  CO2 29 33* 32 35* 33* 29  GLUCOSE 115* 125* 119* 108* 92 89  BUN 8 14 16 16 12 10   CREATININE 0.59* 0.56* 0.67 0.78 0.78 0.65  CALCIUM 9.4 9.7 9.4 9.5 9.1 9.1     Brief HPI: Colin Stevens is a 50 year old RH-male with history of HTN, nicotine dependence, alcohol abuse who was admitted on 02/19/2021 with 3-week history of progressive SOB with BLE edema, increasing abdominal girth, PND with productive cough, tremors as well as waxing and waning of mental status with recurrent falls.  He was noted to be hypoxic at admission with elevated BNP and hyponatremic.  He was found to have DTs due to inability to consume usual amount of alcohol.  He was cleared with IV diuresis for mild pulmonary edema.  Abdominal ultrasound showed nonspecific GB thickening with coarsened increased hepatic parenchyma compatible with steatosis and possible cirrhosis.  2D echo showed EF of 60%.  Acute on chronic respiratory failure felt to be due to acute on chronic CHF and resolved with diuresis and addition of beta-blocker.  MRI brain was negative for acute changes and mental status changes felt to be due to alcohol withdrawal syndrome and subacute thiamine deficiency due to low normal thiamine status.  He was treated with  B12 supplement as well as high-dose thiamine x3 days and transition to oral dose.  Hospitalist significant for left arm pain due to erythema and edema felt to be due to IV infiltration/trauma after patient will be IV.  Dr. 02/21/2021 was consulted for input and x-rays of left forearm and elbow showed soft tissue swelling posterior to elbow but no fracture.  He was treated with Keflex for cellulitis but continued to have reports of elbow pain therefore was treated with steroids and colchicine added due to concerns of gout flare.  He did have recurrent drop in sodium and was treated with salt tabs, Sams as well as high-dose Lasix.  His mentation was improving and decreasing anxiety state but continued to have intermittent confusion, tremors and shakes with fatigue as well as limitations in mobility and ADLs.  CIR was recommended due to functional decline.    Hospital Course: [facility]GE was admitted to rehab 03/01/2021 for inpatient therapies to consist of PT and OT at least three hours five days a week. Past admission physiatrist, therapy team and rehab RN have worked together to provide customized collaborative inpatient rehab.  Follow-up labs showed sodium levels to be improving and fluid restriction as well as salt tabs were discontinued by discharge.  His blood pressures were monitored on TID basis and clonidine was resumed with improvement in blood pressure control.  Librium was tapered off without any changes in mentation or new symptoms.  Insomnia has been managed with use of low-dose  melatonin.  Marland Kitchen  He has been educated on importance of tobacco cessation as well as alcohol cessation.  Mood has been stable and he has made good progress during his stay. Supervision is recommended at discharge for safety after discharge.  He has been provided with HEP and no follow-up therapy recommended after discharge.   Rehab course: During patient's stay in rehab team conferences was held to monitor patient's  progress, set goals and discuss barriers to discharge. At admission, patient required min assist with mobility and with ADL tasks. Speech therapy evaluation revealed cognition back to baseline with SLUMS score of 25/30 therefore no follow-up. He  has had improvement in activity tolerance, balance, postural control as well as ability to compensate for deficits. He is able to complete ADL tasks at modified independent level. He is independent for transfers and is able to ambulate 3 and feet without assistive device.  He requires supervision with verbal cues for ramp/curb navigation.  Disposition: Home  Diet: Regular. Limit fluids to 2 liters/day.   Special Instructions: 1. No driving till cleared by MD.  2. Recommend repeat BMET in 1 week to follow sodium levels.   Allergies as of 03/06/2021       Reactions   Aleve [naproxen] Rash        Medication List     STOP taking these medications    omeprazole 20 MG tablet Commonly known as: PRILOSEC OTC   TUMS ULTRA 1000 PO       TAKE these medications    carvedilol 12.5 MG tablet Commonly known as: COREG Take 1 tablet (12.5 mg total) by mouth 2 (two) times daily with a meal.   cloNIDine 0.1 MG tablet Commonly known as: CATAPRES Take 1 tablet (0.1 mg total) by mouth 2 (two) times daily.   colchicine 0.6 MG tablet Take 1 tablet (0.6 mg total) by mouth daily.   diclofenac Sodium 1 % Gel Commonly known as: VOLTAREN Apply 2 g topically 4 (four) times daily.   folic acid 1 MG tablet Commonly known as: FOLVITE Take 1 tablet (1 mg total) by mouth daily.   lisinopril 5 MG tablet Commonly known as: ZESTRIL Take 1 tablet (5 mg total) by mouth daily. What changed: medication strength   loperamide 2 MG capsule Commonly known as: IMODIUM Take 1 capsule (2 mg total) by mouth every 6 (six) hours as needed for diarrhea or loose stools.   magnesium oxide 400 MG tablet Commonly known as: MAG-OX Take 1 tablet (400 mg total) by mouth  daily.   melatonin 5 MG Tabs Take 5 mg by mouth at bedtime.   multivitamin with minerals Tabs tablet Take 1 tablet by mouth daily.   nicotine 21 mg/24hr patch Commonly known as: NICODERM CQ - dosed in mg/24 hours Place 1 patch (21 mg total) onto the skin daily.   nystatin 100000 UNIT/ML suspension Commonly known as: MYCOSTATIN Use as directed 5 mLs (500,000 Units total) in the mouth or throat 4 (four) times daily.   pantoprazole 40 MG tablet Commonly known as: PROTONIX Take 1 tablet (40 mg total) by mouth daily.   propranolol 20 MG tablet Commonly known as: INDERAL Take 1 tablet (20 mg total) by mouth 3 (three) times daily as needed (anxiety).   REDNESS RELIEF OP Place 1 drop into both eyes daily as needed (redness/irritation/dry eyes).   Senexon-S 8.6-50 MG tablet Generic drug: senna-docusate Take 2 tablets by mouth daily at 4 PM.   thiamine 100 MG tablet Take 1  tablet (100 mg total) by mouth daily.   vitamin B-12 1000 MCG tablet Commonly known as: CYANOCOBALAMIN Take 1 tablet (1,000 mcg total) by mouth daily.        Follow-up Information     Ranelle Oyster, MD. Call.   Specialty: Physical Medicine and Rehabilitation Why: As needed Contact information: [address] [place] [address] [phone]         Clayborn Heron, MD. Call.   Specialty: Family Medicine Why: for post hospital follow up Contact information: [address] [place] [address] [phone]                 Signed: Jacquelynn Cree 03/09/2021, 6:10 PM

## 2021-03-04 NOTE — Progress Notes (Signed)
PROGRESS NOTE   Subjective/Complaints: Slept a little better. No new problems. Therapy going so well that he might be ready for dc Sunday  ROS: Patient denies fever, rash, sore throat, blurred vision, nausea, vomiting, diarrhea, cough, shortness of breath or chest pain, joint or back pain, headache, or mood change.    Objective:   No results found. Recent Labs    03/02/21 0558  WBC 5.6  HGB 15.2  HCT 43.9  PLT 261   Recent Labs    03/02/21 0558 03/04/21 0510  NA 132* 133*  K 3.5 3.6  CL 92* 97*  CO2 33* 29  GLUCOSE 92 89  BUN 12 10  CREATININE 0.78 0.65  CALCIUM 9.1 9.1    Intake/Output Summary (Last 24 hours) at 03/04/2021 1127 Last data filed at 03/04/2021 0842 Gross per 24 hour  Intake 336 ml  Output 550 ml  Net -214 ml        Physical Exam: Vital Signs Blood pressure (!) 141/94, pulse 95, temperature 97.8 F (36.6 C), temperature source Oral, resp. rate 16, height 6\' 4"  (1.93 m), weight (!) 137.7 kg, SpO2 95 %.  Constitutional: No distress . Vital signs reviewed. HEENT: NCAT, EOMI, oral membranes moist Neck: supple Cardiovascular: RRR without murmur. No JVD    Respiratory/Chest: CTA Bilaterally without wheezes or rales. Normal effort    GI/Abdomen: BS +, non-tender, sl-distended Ext: no clubbing, cyanosis, or edema Psych: pleasant and cooperative  Skin: a few scattered abrasions and bruises Neuro:  Alert and oriented x 3. Normal insight and awareness. Intact Memory. Normal language and speech. Cranial nerve exam unremarkable. UE 5/5 prox to distal. LE 4/5 prox to 5/5 distally. No focal sensory ifindings.  Musculoskeletal: Full ROM, No pain with AROM or PROM in the neck, trunk, or extremities. Posture appropriate     Assessment/Plan: 1. Functional deficits which require 3+ hours per day of interdisciplinary therapy in a comprehensive inpatient rehab setting. Physiatrist is providing close team  supervision and 24 hour management of active medical problems listed below. Physiatrist and rehab team continue to assess barriers to discharge/monitor patient progress toward functional and medical goals  Care Tool:  Bathing    Body parts bathed by patient: Right arm, Left arm, Chest, Abdomen, Front perineal area, Buttocks, Right upper leg, Left upper leg, Right lower leg, Left lower leg, Face         Bathing assist Assist Level: Minimal Assistance - Patient > 75%     Upper Body Dressing/Undressing Upper body dressing   What is the patient wearing?: Pull over shirt    Upper body assist Assist Level: Set up assist    Lower Body Dressing/Undressing Lower body dressing      What is the patient wearing?: Pants     Lower body assist Assist for lower body dressing: Minimal Assistance - Patient > 75%     Toileting Toileting    Toileting assist Assist for toileting: Minimal Assistance - Patient > 75%     Transfers Chair/bed transfer  Transfers assist     Chair/bed transfer assist level: Minimal Assistance - Patient > 75%     Locomotion Ambulation   Ambulation assist  Assist level: Minimal Assistance - Patient > 75% Assistive device: No Device Max distance: 150'   Walk 10 feet activity   Assist     Assist level: Minimal Assistance - Patient > 75% Assistive device: No Device   Walk 50 feet activity   Assist    Assist level: Minimal Assistance - Patient > 75% Assistive device: No Device    Walk 150 feet activity   Assist    Assist level: Minimal Assistance - Patient > 75% Assistive device: No Device    Walk 10 feet on uneven surface  activity   Assist     Assist level: Minimal Assistance - Patient > 75%     Wheelchair     Assist Is the patient using a wheelchair?: No             Wheelchair 50 feet with 2 turns activity    Assist            Wheelchair 150 feet activity     Assist          Blood  pressure (!) 141/94, pulse 95, temperature 97.8 F (36.6 C), temperature source Oral, resp. rate 16, height 6\' 4"  (1.93 m), weight (!) 137.7 kg, SpO2 95 %.  Medical Problem List and Plan: 1.  Debility secondary to complicated medical issues including CHF exacerbation and newly dx'd cirrhosis             -patient may  shower             -ELOS/Goals: home Sunday?  -Continue CIR therapies including PT, OT, and SLP  2.  Antithrombotics: -DVT/anticoagulation:  Pharmaceutical: Lovenox             -antiplatelet therapy: N/A 3. Pain Management: Tylenol prn.  4. Mood: LCSW to follow for evaluation and support. Used propranolol tid prn for anxiety-->resumed.              -antipsychotic agents: N/A 5. Neuropsych: This patient is not fully capable of making decisions on his own behalf. 6. Skin/Wound Care: Routine pressure relief measures.              --monitor elbow for any changes.  7. Fluids/Electrolytes/Nutrition: Monitor I/O. Check lytes in am. 8. ETOH abuse/DT: Librium being slowly tapered--reduce to  5 mg BID             --Continue thiamine 100 mg daily and cyanocobalamin 1000 mg daily             --Folic acid 1 mg daily  -pt has voiced desire to give up etoh and tobacco 9. HTN: Monitor BP tid--on Zestril. Resumed low dose catapres bid-- -bp controlled 11/4 10. Left elbow erythema/pain/edema: Completed 4 day course of Keflex per ortho             --On colchicine bid with solumedrol taper -->completed.   11.  Hyponatremia: Has improved from 128-->134-->132.  -Continue salt tabs.             --continue 1500 cc FR until dc.  12. Hypomagnesemia:  wnl --continue oral supplement.  13. Constipation:   Senna S-     LOS: 3 days A FACE TO FACE EVALUATION WAS PERFORMED  13/4 03/04/2021, 11:27 AM

## 2021-03-05 NOTE — Progress Notes (Signed)
PROGRESS NOTE   Subjective/Complaints:   No issues overnite , excited about d/c in am   ROS: Patient denies CP, SOB, N/V/D.    Objective:   No results found. No results for input(s): WBC, HGB, HCT, PLT in the last 72 hours.  Recent Labs    03/04/21 0510  NA 133*  K 3.6  CL 97*  CO2 29  GLUCOSE 89  BUN 10  CREATININE 0.65  CALCIUM 9.1     Intake/Output Summary (Last 24 hours) at 03/05/2021 0723 Last data filed at 03/05/2021 0357 Gross per 24 hour  Intake 476 ml  Output 1325 ml  Net -849 ml         Physical Exam: Vital Signs Blood pressure 135/80, pulse 89, temperature (!) 97.3 F (36.3 C), resp. rate 18, height 6\' 4"  (1.93 m), weight (!) 138 kg, SpO2 96 %.   General: No acute distress Mood and affect are appropriate Heart: Regular rate and rhythm no rubs murmurs or extra sounds Lungs: Clear to auscultation, breathing unlabored, no rales or wheezes Abdomen: Positive bowel sounds, soft nontender to palpation, nondistended Extremities: No clubbing, cyanosis, or edema Skin: No evidence of breakdown, no evidence of rash   Neuro:  Alert and oriented x 3. Normal insight and awareness. Intact Memory. Normal language and speech. Cranial nerve exam unremarkable. UE 5/5 prox to distal. LE 4/5 prox to 5/5 distally. No focal sensory ifindings.  Musculoskeletal: Full ROM, No pain with AROM or PROM in the neck, trunk, or extremities. Posture appropriate     Assessment/Plan: 1. Functional deficits which require 3+ hours per day of interdisciplinary therapy in a comprehensive inpatient rehab setting. Physiatrist is providing close team supervision and 24 hour management of active medical problems listed below. Physiatrist and rehab team continue to assess barriers to discharge/monitor patient progress toward functional and medical goals  Care Tool:  Bathing    Body parts bathed by patient: Right arm, Left arm,  Chest, Abdomen, Front perineal area, Buttocks, Right upper leg, Left upper leg, Right lower leg, Left lower leg, Face         Bathing assist Assist Level: Minimal Assistance - Patient > 75%     Upper Body Dressing/Undressing Upper body dressing   What is the patient wearing?: Pull over shirt    Upper body assist Assist Level: Independent    Lower Body Dressing/Undressing Lower body dressing      What is the patient wearing?: Pants     Lower body assist Assist for lower body dressing: Minimal Assistance - Patient > 75%     Toileting Toileting    Toileting assist Assist for toileting: Independent with assistive device     Transfers Chair/bed transfer  Transfers assist     Chair/bed transfer assist level: Supervision/Verbal cueing     Locomotion Ambulation   Ambulation assist      Assist level: Minimal Assistance - Patient > 75% Assistive device: No Device Max distance: 150'   Walk 10 feet activity   Assist     Assist level: Minimal Assistance - Patient > 75% Assistive device: No Device   Walk 50 feet activity   Assist  Assist level: Minimal Assistance - Patient > 75% Assistive device: No Device    Walk 150 feet activity   Assist    Assist level: Minimal Assistance - Patient > 75% Assistive device: No Device    Walk 10 feet on uneven surface  activity   Assist     Assist level: Minimal Assistance - Patient > 75%     Wheelchair     Assist Is the patient using a wheelchair?: No             Wheelchair 50 feet with 2 turns activity    Assist            Wheelchair 150 feet activity     Assist          Blood pressure 135/80, pulse 89, temperature (!) 97.3 F (36.3 C), resp. rate 18, height 6\' 4"  (1.93 m), weight (!) 138 kg, SpO2 96 %.  Medical Problem List and Plan: 1.  Debility secondary to complicated medical issues including CHF exacerbation and newly dx'd cirrhosis             -patient may   shower             -ELOS/Goals: d/c in am   -Continue CIR therapies including PT, OT, and SLP  2.  Antithrombotics: -DVT/anticoagulation:  Pharmaceutical: Lovenox             -antiplatelet therapy: N/A 3. Pain Management: Tylenol prn.  4. Mood: LCSW to follow for evaluation and support. Used propranolol tid prn for anxiety-->resumed.              -antipsychotic agents: N/A 5. Neuropsych: This patient is not fully capable of making decisions on his own behalf. 6. Skin/Wound Care: Routine pressure relief measures.              --monitor elbow for any changes.  7. Fluids/Electrolytes/Nutrition: Monitor I/O. Check lytes in am. 8. ETOH abuse/DT: Librium being slowly tapered--reduce to  5 mg BID             --Continue thiamine 100 mg daily and cyanocobalamin 1000 mg daily             --Folic acid 1 mg daily  -pt has voiced desire to give up etoh and tobacco 9. HTN: Monitor BP tid--on Zestril. Resumed low dose catapres bid-- Vitals:   03/04/21 1954 03/05/21 0357  BP: 139/89 135/80  Pulse: 92 89  Resp: 18 18  Temp: 97.7 F (36.5 C) (!) 97.3 F (36.3 C)  SpO2: 97% 96%   Controlled 11/5 10. Left elbow erythema/pain/edema: Completed 4 day course of Keflex per ortho             --On colchicine bid with solumedrol taper -->completed.   11.  Hyponatremia: Has improved from 128-->134-->132.  -Continue salt tabs.             --continue 1500 cc FR until dc.  12. Hypomagnesemia:  wnl --continue oral supplement.  13. Constipation:   Senna S-     LOS: 4 days A FACE TO FACE EVALUATION WAS PERFORMED  13/5 03/05/2021, 7:23 AM

## 2021-03-05 NOTE — Progress Notes (Signed)
Occupational Therapy Session Note  Patient Details  Name: Colin Stevens MRN: [id number] Date of Birth: August 28, 1970  Today's Date: 03/05/2021 OT Individual Time: 1300-1359 OT Individual Time Calculation (min): 59 min    Short Term Goals: Week 1:  OT Short Term Goal 1 (Week 1): STG=LTG d.t ELOS  Skilled Therapeutic Interventions/Progress Updates:     Pt received in bed with wife present and no pain ADL: Pt completes ADL at overall MOD I level at shower level with education on seated BADL performance initially for energy conservation and safety. Pt able to demo throughout. Pt, wife and OT discuss DC plan, DME, recommendations to establish a routine to improve functional mobility and activity tolerance initially, and to limit visitors to allow pt to reacclimate. Edu on bathroom safety adaptations to improve safety during night time toileting as well as supervision for any more challenging tasks (I.e. taking out trash).   Pt left end of session in bed with exit alarm on, call light in reach and all needs met   Therapy Documentation Precautions:  Precautions Precautions: Fall Precaution Comments: monitor O2, fearful of falling Restrictions Weight Bearing Restrictions: No General:   Therapy/Group: Individual Therapy  Tonny Branch 03/05/2021, 6:53 AM

## 2021-03-05 NOTE — Progress Notes (Signed)
Physical Therapy Discharge Summary  Patient Details  Name: Colin Stevens MRN: [id number] Date of Birth: 05/01/1971  Today's Date: 03/05/2021 PT Individual Time: 2774-1287 PT Individual Time Calculation (min): 56 min    Patient has met 9 of 9 long term goals due to improved activity tolerance, improved balance, improved postural control, and increased strength.  Patient to discharge at an ambulatory level Supervision.   Patient's care partner is independent to provide the necessary physical assistance at discharge.  Reasons goals not met: NA  Recommendation:  Patient provided with HEP and will not require additional skilled PT following DC, as pt was educated and able to demonstrate HEP.  Equipment: No equipment provided  Reasons for discharge: treatment goals met and discharge from hospital  Patient/family agrees with progress made and goals achieved: Yes  Skilled therapeutic Interventions: Pt received supine in bed and agrees to therapy. No complaint of pain. Bed mobility and transfers independent. Pt ambulates from room to gym on 4th floor with cues for increased stride length and gait speed to decrease risk for falls. Pt completes BERG balance test and scores 51/56, up significantly from earlier assessment on day of eval. Pt ambulates to dayroom and completes x12:00 on nustep for strength and endurance training, at workload of 6 with average steps per minute ~60. PT provides pt with HEP and demonstrates each exercise for pt and describes safe performance. Pt then ambulates back to room with improving gait pattern. Left seated at EOB with all needs within reach.  PT Discharge Precautions/Restrictions Precautions Precautions: Fall Restrictions Weight Bearing Restrictions: No Pain Interference Pain Interference Pain Effect on Sleep: 1. Rarely or not at all Pain Interference with Therapy Activities: 1. Rarely or not at all Pain Interference with Day-to-Day Activities: 1. Rarely or  not at all Vision/Perception  Vision - History Ability to See in Adequate Light: 0 Adequate Perception Perception: Within Functional Limits Praxis Praxis: Intact  Cognition Overall Cognitive Status: Within Functional Limits for tasks assessed Arousal/Alertness: Awake/alert Orientation Level: Oriented X4 Safety/Judgment: Appears intact Sensation Sensation Light Touch: Appears Intact Coordination Gross Motor Movements are Fluid and Coordinated: Yes Fine Motor Movements are Fluid and Coordinated: Yes Motor  Motor Motor: Within Functional Limits  Mobility Bed Mobility Bed Mobility: Sit to Supine;Supine to Sit Supine to Sit: Independent Sit to Supine: Independent Transfers Transfers: Sit to Stand;Stand Pivot Transfers;Stand to Sit Sit to Stand: Independent Stand to Sit: Independent Stand Pivot Transfers: Independent Locomotion  Gait Ambulation: Yes Gait Assistance: Supervision/Verbal cueing Gait Distance (Feet): 300 Feet Assistive device: None Gait Assistance Details: Verbal cues for gait pattern;Verbal cues for technique Gait Gait: Yes Gait Pattern: Impaired Gait Pattern: Decreased stride length;Decreased trunk rotation Gait velocity: decreased, but improved from eval Stairs / Additional Locomotion Stair Management Technique: Two rails Number of Stairs: 12 Height of Stairs: 6 Ramp: Supervision/Verbal cueing Curb: Supervision/Verbal cueing Wheelchair Mobility Wheelchair Mobility: No  Trunk/Postural Assessment  Cervical Assessment Cervical Assessment:  (forward head) Thoracic Assessment Thoracic Assessment:  (rounded shoulders) Lumbar Assessment Lumbar Assessment:  (posterior pelvic tilt) Postural Control Postural Control: Within Functional Limits  Balance Standardized Balance Assessment Standardized Balance Assessment: Berg Balance Test Berg Balance Test Sit to Stand: Able to stand without using hands and stabilize independently Standing Unsupported: Able  to stand safely 2 minutes Sitting with Back Unsupported but Feet Supported on Floor or Stool: Able to sit safely and securely 2 minutes Stand to Sit: Sits safely with minimal use of hands Transfers: Able to transfer safely, minor use of  hands Standing Unsupported with Eyes Closed: Able to stand 10 seconds safely Standing Ubsupported with Feet Together: Able to place feet together independently and stand 1 minute safely From Standing, Reach Forward with Outstretched Arm: Can reach confidently >25 cm (10") From Standing Position, Pick up Object from Floor: Able to pick up shoe safely and easily From Standing Position, Turn to Look Behind Over each Shoulder: Looks behind from both sides and weight shifts well Turn 360 Degrees: Able to turn 360 degrees safely but slowly Standing Unsupported, Alternately Place Feet on Step/Stool: Able to stand independently and complete 8 steps >20 seconds Standing Unsupported, One Foot in Front: Able to plae foot ahead of the other independently and hold 30 seconds Standing on One Leg: Able to lift leg independently and hold 5-10 seconds Total Score: 51 Static Sitting Balance Static Sitting - Balance Support: Feet supported Static Sitting - Level of Assistance: 7: Independent Dynamic Sitting Balance Dynamic Sitting - Balance Support: During functional activity;Feet supported Dynamic Sitting - Level of Assistance: 7: Independent Static Standing Balance Static Standing - Balance Support: During functional activity Static Standing - Level of Assistance: 7: Independent Dynamic Standing Balance Dynamic Standing - Balance Support: During functional activity Dynamic Standing - Level of Assistance: 6: Modified independent (Device/Increase time) Extremity Assessment  RLE Assessment General Strength Comments: 5/5 but endurance impaired LLE Assessment General Strength Comments: 5/5 but endurance impaired    Breck Coons, PT, DPT 03/05/2021, 2:32 PM

## 2021-03-05 NOTE — Progress Notes (Signed)
Occupational Therapy Session Note  Patient Details  Name: Colin Stevens MRN: [id number] Date of Birth: 01/15/1971  Today's Date: 03/05/2021 OT Individual Time: 1050-1200 OT Individual Time Calculation (min): 70 min    Short Term Goals: Week 1:  OT Short Term Goal 1 (Week 1): STG=LTG d.t ELOS   Skilled Therapeutic Interventions/Progress Updates:    Pt received sitting EOB with his wife present, no c/o pain. Edu provided re d/c planning, community accessibility, and reintegration into ADLs/IADLs at home. Pt donned shoes with mod I EOB. Supervision for 300+ ft of functional mobility to the therapy gym, including accessing elevator. Pt completed Wii bowling game to challenge functional activity tolerance and dynamic standing balance- with step forward to narrow BOS with no LOB. He then completed Wii Golf game- transitioning in and out of narrow lateral stance with BUE swing through on Wii remote. No LOB and great tolerance to activity- standing for 20 minutes without rest. Lastly, pt completed full body strengthening circuit with HIIT style cueing- bouts of heavy work with rest break for several repetitions. Vitals assessed throughout and Spo2 and HR were both 94-97% and 95 bpm respectively. Pt returned to his room and was left sitting up with his wife present, all needs met.   Therapy Documentation Precautions:  Precautions Precautions: Fall Precaution Comments: monitor O2, fearful of falling Restrictions Weight Bearing Restrictions: No   Therapy/Group: Individual Therapy  Sandra H Davis 03/05/2021, 7:27 AM 

## 2021-03-06 NOTE — Progress Notes (Signed)
PROGRESS NOTE   Subjective/Complaints:   Reviewed antihypertensive meds  ROS: Patient denies CP, SOB, N/V/D.    Objective:   No results found. No results for input(s): WBC, HGB, HCT, PLT in the last 72 hours.  Recent Labs    03/04/21 0510  NA 133*  K 3.6  CL 97*  CO2 29  GLUCOSE 89  BUN 10  CREATININE 0.65  CALCIUM 9.1     Intake/Output Summary (Last 24 hours) at 03/06/2021 0724 Last data filed at 03/06/2021 0351 Gross per 24 hour  Intake 708 ml  Output 2050 ml  Net -1342 ml         Physical Exam: Vital Signs Blood pressure 134/86, pulse 99, temperature 98.3 F (36.8 C), temperature source Oral, resp. rate 18, height 6\' 4"  (1.93 m), weight (!) 137.4 kg, SpO2 95 %.   General: No acute distress Mood and affect are appropriate Heart: Regular rate and rhythm no rubs murmurs or extra sounds Lungs: Clear to auscultation, breathing unlabored, no rales or wheezes Abdomen: Positive bowel sounds, soft nontender to palpation, nondistended Extremities: No clubbing, cyanosis, or edema Skin: No evidence of breakdown, no evidence of rash   Neuro:  Alert and oriented x 3. Normal insight and awareness. Intact Memory. Normal language and speech. Cranial nerve exam unremarkable. UE 5/5 prox to distal. LE 4/5 prox to 5/5 distally. No focal sensory ifindings.  Musculoskeletal: Full ROM, No pain with AROM or PROM in the neck, trunk, or extremities. Posture appropriate     Assessment/Plan: 1. Functional deficits due to debility  Stable for D/C today F/u PCP in 3-4 weeks F/u PM&R 2 weeks See D/C summary See D/C instructions   Care Tool:  Bathing    Body parts bathed by patient: Right arm, Left arm, Chest, Abdomen, Front perineal area, Buttocks, Right upper leg, Left upper leg, Right lower leg, Left lower leg, Face         Bathing assist Assist Level: Independent     Upper Body Dressing/Undressing Upper body  dressing   What is the patient wearing?: Pull over shirt    Upper body assist Assist Level: Independent    Lower Body Dressing/Undressing Lower body dressing      What is the patient wearing?: Pants     Lower body assist Assist for lower body dressing: Independent     Toileting Toileting    Toileting assist Assist for toileting: Independent with assistive device     Transfers Chair/bed transfer  Transfers assist     Chair/bed transfer assist level: Independent     Locomotion Ambulation   Ambulation assist      Assist level: Supervision/Verbal cueing Assistive device: No Device Max distance: 400'   Walk 10 feet activity   Assist     Assist level: Supervision/Verbal cueing Assistive device: No Device   Walk 50 feet activity   Assist    Assist level: Supervision/Verbal cueing Assistive device: No Device    Walk 150 feet activity   Assist    Assist level: Supervision/Verbal cueing Assistive device: No Device    Walk 10 feet on uneven surface  activity   Assist     Assist  level: Supervision/Verbal cueing     Wheelchair     Assist Is the patient using a wheelchair?: No             Wheelchair 50 feet with 2 turns activity    Assist            Wheelchair 150 feet activity     Assist          Blood pressure 134/86, pulse 99, temperature 98.3 F (36.8 C), temperature source Oral, resp. rate 18, height 6\' 4"  (1.93 m), weight (!) 137.4 kg, SpO2 95 %.  Medical Problem List and Plan: 1.  Debility secondary to complicated medical issues including CHF exacerbation and newly dx'd cirrhosis             -patient may  shower             -ELOS/Goals: d/c itoday   -Continue CIR therapies including PT, OT, and SLP  2.  Antithrombotics: -DVT/anticoagulation:  Pharmaceutical: Lovenox             -antiplatelet therapy: N/A 3. Pain Management: Tylenol prn.  4. Mood: LCSW to follow for evaluation and support. Used  propranolol tid prn for anxiety-->resumed.              -antipsychotic agents: N/A 5. Neuropsych: This patient is not fully capable of making decisions on his own behalf. 6. Skin/Wound Care: Routine pressure relief measures.              --monitor elbow for any changes.  7. Fluids/Electrolytes/Nutrition: Monitor I/O. Check lytes in am. 8. ETOH abuse/DT: Librium being slowly tapered--reduce to  5 mg BID             --Continue thiamine 100 mg daily and cyanocobalamin 1000 mg daily             --Folic acid 1 mg daily  -pt has voiced desire to give up etoh and tobacco 9. HTN: Monitor BP tid--on Zestril. Resumed low dose catapres bid-- Vitals:   03/05/21 1930 03/06/21 0351  BP: 116/77 134/86  Pulse: 85 99  Resp: 18 18  Temp: 98.2 F (36.8 C) 98.3 F (36.8 C)  SpO2: 97% 95%   Controlled 11/6, Coreg 12.5mg  BID. Zestril 5mg  qd, Inderal 20mg  qd, clonidine .1mg  BID- f/u PCP 10. Left elbow erythema/pain/edema: Completed 4 day course of Keflex per ortho             --On colchicine bid with solumedrol taper -->completed.   11.  Hyponatremia: Has improved from 128-->134-->132.  -Continue salt tabs.             --continue 1500 cc FR until dc.  12. Hypomagnesemia:  wnl --continue oral supplement.  13. Constipation:   Senna S-     LOS: 5 days A FACE TO FACE EVALUATION WAS PERFORMED  13/06/22 03/06/2021, 7:24 AM

## 2021-03-06 NOTE — Progress Notes (Signed)
Pt discharged to home with wife and personal belongings, discharge instructions verbalized with an understanding of managed care at home, medications, and follow up appts. TC meds provided to wife and white copy in chart.

## 2021-03-08 NOTE — Patient Care Conference (Signed)
Inpatient RehabilitationTeam Conference and Plan of Care Update Date: 03/08/2021   Time: 10:45 AM    Patient Name: Colin Stevens      Medical Record Number: [id number]  Date of Birth: [birth year] Sex: Male         Room/Bed: 5C08C/5C08C-01 Payor Info: Payor: /    Admit Date/Time:  03/01/2021  3:48 PM  Primary Diagnosis:  [place] Problems: Principal Problem:   Debility Active Problems:   Fall (on) (from) other stairs and steps, subsequent encounter    Expected Discharge Date: Expected Discharge Date: 03/06/21  Team Members Present: Physician leading conference: Dr. Alger Simons Social Worker Present: Loralee Pacas, [place] Nurse Present: Dorthula Nettles, RN PT Present: Tereasa Coop, PT OT Present: Cherylynn Ridges, OT PPS Coordinator present : Gunnar Fusi, SLP     Current Status/Progress Goal Weekly Team Focus  Bowel/Bladder   continent b/b, lbm 03/01/21  remain continent  assess toileting needs as needed   Swallow/Nutrition/ Hydration             ADL's     Supervision/Mod I Supervision/Mod I Dc planning, self-care retraining, activity tolerance  Mobility   supervision/mod(I)  supervision/mod(I)  DC planning, family education   Communication             Safety/Cognition/ Behavioral Observations            Pain   no pain reported  < 3  assess pain q 4hr and prn   Skin   CDI  no breakdown  assess skin q shift and prn     Discharge Planning:   Pt is uninsured and has a MCD application that was submitted prior to admission. Pt to d/c to home with his wife who can provide 24/7 care since she works from home. PRN support from his mother and MIL to assist as well. Pt will be set up for [facility] medication assistance program. NO further therapies recommended at this time.  Team Discussion: Patient met all Supervision/Mod I goals. Medically stable for discharge. Home with spouse. Patient on target to meet rehab goals: yes  *See Care Plan and progress notes  for long and short-term goals.   Revisions to Treatment Plan:  None  Teaching Needs: Family education successfully completed. Ready for discharge.  Current Barriers to Discharge: Home enviroment access/layout, Lack of/limited family support, Weight, and Medication compliance  Possible Resolutions to Barriers: Family education  Set-up for [facility] for medication assist No follow-up therapy recommended     Medical Summary               I attest that I was present, lead the team conference, and concur with the assessment and plan of the team.   Cristi Loron 03/08/2021, 1:34 PM

## 2021-03-08 NOTE — Progress Notes (Signed)
Inpatient Rehabilitation Care Coordinator Discharge Note   Patient Details  Name: Colin Stevens MRN: [id number] Date of Birth: 04-16-1971   Discharge location: D/c to hom with his wife who will provide 24/7 care since she works from home.  Length of Stay: 4 days  Discharge activity level: Supervision  Home/community participation: Limited  Patient response BP:ZWCHEN Literacy - How often do you need to have someone help you when you read instructions, pamphlets, or other written material from your doctor or pharmacy?: Never  Patient response ID:[id number] Isolation - How often do you feel lonely or isolated from those around you?: Never  Services provided included: MD, RD, PT, OT, SLP, RN, CM, Pharmacy, Neuropsych, SW, TR  Financial Services:  Field seismologist Utilized: Other (Comment) (self-pay; medicaid application submitted prior to hospital admission)    Choices offered to/list presented to: N/A  Follow-up services arranged:    Patient response to transportation need: Is the patient able to respond to transportation needs?: No In the past 12 months, has lack of transportation kept you from medical appointments or from getting medications?: No In the past 12 months, has lack of transportation kept you from meetings, work, or from getting things needed for daily living?: No  Comments (or additional information):  Patient/Family verbalized understanding of follow-up arrangements:  Yes  Individual responsible for coordination of the follow-up plan: contact pt wife Colin Stevens#[phone]  Confirmed correct DME delivered: Gretchen Short 03/08/2021    Gretchen Short

## 2021-03-09 DIAGNOSIS — F1010 Alcohol abuse, uncomplicated: Secondary | ICD-10-CM

## 2021-03-09 DIAGNOSIS — E871 Hypo-osmolality and hyponatremia: Secondary | ICD-10-CM

## 2021-03-09 DIAGNOSIS — G4700 Insomnia, unspecified: Secondary | ICD-10-CM

## 2021-03-15 ENCOUNTER — Encounter: Payer: Self-pay | Admitting: Registered Nurse

## 2021-08-19 ENCOUNTER — Other Ambulatory Visit: Payer: Self-pay | Admitting: Gastroenterology

## 2021-08-19 ENCOUNTER — Other Ambulatory Visit: Payer: Self-pay | Admitting: Family Medicine

## 2021-08-19 DIAGNOSIS — K7030 Alcoholic cirrhosis of liver without ascites: Secondary | ICD-10-CM

## 2021-08-22 ENCOUNTER — Other Ambulatory Visit: Payer: Self-pay

## 2021-08-22 ENCOUNTER — Ambulatory Visit
Admission: RE | Admit: 2021-08-22 | Discharge: 2021-08-22 | Disposition: A | Discharge status: Home / Self Care | Payer: Managed Care, Other (non HMO) | Source: Ambulatory Visit | Attending: Family Medicine | Admitting: Family Medicine

## 2021-10-11 ENCOUNTER — Ambulatory Visit
Admission: RE | Admit: 2021-10-11 | Discharge: 2021-10-11 | Disposition: A | Discharge status: Home / Self Care | Payer: Commercial Managed Care - HMO | Source: Ambulatory Visit | Attending: Gastroenterology | Admitting: Gastroenterology

## 2021-10-11 DIAGNOSIS — K7030 Alcoholic cirrhosis of liver without ascites: Secondary | ICD-10-CM

## 2022-08-02 DIAGNOSIS — I10 Essential (primary) hypertension: Secondary | ICD-10-CM | POA: Diagnosis not present

## 2022-08-02 DIAGNOSIS — Z125 Encounter for screening for malignant neoplasm of prostate: Secondary | ICD-10-CM | POA: Diagnosis not present

## 2022-08-02 DIAGNOSIS — Z0000 Encounter for general adult medical examination without abnormal findings: Secondary | ICD-10-CM | POA: Diagnosis not present

## 2022-08-02 DIAGNOSIS — E7800 Pure hypercholesterolemia, unspecified: Secondary | ICD-10-CM | POA: Diagnosis not present

## 2022-08-02 DIAGNOSIS — Z5181 Encounter for therapeutic drug level monitoring: Secondary | ICD-10-CM | POA: Diagnosis not present

## 2022-08-03 ENCOUNTER — Other Ambulatory Visit: Payer: Self-pay | Admitting: Family Medicine

## 2022-08-03 DIAGNOSIS — Z122 Encounter for screening for malignant neoplasm of respiratory organs: Secondary | ICD-10-CM

## 2022-09-27 ENCOUNTER — Ambulatory Visit
Admission: RE | Admit: 2022-09-27 | Discharge: 2022-09-27 | Disposition: A | Discharge status: Home / Self Care | Payer: BC Managed Care – PPO | Source: Ambulatory Visit | Attending: Family Medicine | Admitting: Family Medicine

## 2022-09-27 DIAGNOSIS — Z122 Encounter for screening for malignant neoplasm of respiratory organs: Secondary | ICD-10-CM

## 2022-09-27 DIAGNOSIS — Z87891 Personal history of nicotine dependence: Secondary | ICD-10-CM | POA: Diagnosis not present

## 2023-02-14 DIAGNOSIS — K7030 Alcoholic cirrhosis of liver without ascites: Secondary | ICD-10-CM | POA: Diagnosis not present

## 2023-02-14 DIAGNOSIS — I700 Atherosclerosis of aorta: Secondary | ICD-10-CM | POA: Diagnosis not present

## 2023-02-14 DIAGNOSIS — E7800 Pure hypercholesterolemia, unspecified: Secondary | ICD-10-CM | POA: Diagnosis not present

## 2023-02-14 DIAGNOSIS — I10 Essential (primary) hypertension: Secondary | ICD-10-CM | POA: Diagnosis not present

## 2023-03-21 DIAGNOSIS — K648 Other hemorrhoids: Secondary | ICD-10-CM | POA: Diagnosis not present

## 2023-03-21 DIAGNOSIS — K7460 Unspecified cirrhosis of liver: Secondary | ICD-10-CM | POA: Diagnosis not present

## 2023-03-21 DIAGNOSIS — Z1211 Encounter for screening for malignant neoplasm of colon: Secondary | ICD-10-CM | POA: Diagnosis not present

## 2023-03-21 DIAGNOSIS — K5730 Diverticulosis of large intestine without perforation or abscess without bleeding: Secondary | ICD-10-CM | POA: Diagnosis not present

## 2023-03-21 DIAGNOSIS — K2100 Gastro-esophageal reflux disease with esophagitis, without bleeding: Secondary | ICD-10-CM | POA: Diagnosis not present

## 2023-03-21 DIAGNOSIS — K2970 Gastritis, unspecified, without bleeding: Secondary | ICD-10-CM | POA: Diagnosis not present

## 2023-10-01 ENCOUNTER — Other Ambulatory Visit: Payer: Self-pay | Admitting: Family Medicine

## 2023-10-01 DIAGNOSIS — Z122 Encounter for screening for malignant neoplasm of respiratory organs: Secondary | ICD-10-CM

## 2023-10-21 ENCOUNTER — Emergency Department (HOSPITAL_BASED_OUTPATIENT_CLINIC_OR_DEPARTMENT_OTHER)
Admission: EM | Admit: 2023-10-21 | Discharge: 2023-10-21 | Disposition: A | Discharge status: Home / Self Care | Attending: Emergency Medicine | Admitting: Emergency Medicine

## 2023-10-21 ENCOUNTER — Encounter (HOSPITAL_BASED_OUTPATIENT_CLINIC_OR_DEPARTMENT_OTHER): Payer: Self-pay | Admitting: Emergency Medicine

## 2023-10-21 ENCOUNTER — Other Ambulatory Visit: Payer: Self-pay

## 2023-10-21 DIAGNOSIS — Z79899 Other long term (current) drug therapy: Secondary | ICD-10-CM | POA: Diagnosis not present

## 2023-10-21 DIAGNOSIS — R1909 Other intra-abdominal and pelvic swelling, mass and lump: Secondary | ICD-10-CM

## 2023-10-21 DIAGNOSIS — I10 Essential (primary) hypertension: Secondary | ICD-10-CM | POA: Diagnosis not present

## 2023-10-21 DIAGNOSIS — R222 Localized swelling, mass and lump, trunk: Secondary | ICD-10-CM | POA: Insufficient documentation

## 2023-10-21 HISTORY — DX: Heart failure, unspecified: I50.9

## 2023-10-21 HISTORY — DX: Unspecified cirrhosis of liver: K74.60

## 2023-10-21 HISTORY — DX: Pure hypercholesterolemia, unspecified: E78.00

## 2023-10-21 LAB — COMPREHENSIVE METABOLIC PANEL WITH GFR
ALT: 11 U/L (ref 0–44)
AST: 18 U/L (ref 15–41)
Albumin: 3.9 g/dL (ref 3.5–5.0)
Alkaline Phosphatase: 34 U/L — ABNORMAL LOW (ref 38–126)
Anion gap: 11 (ref 5–15)
BUN: 18 mg/dL (ref 6–20)
CO2: 26 mmol/L (ref 22–32)
Calcium: 9.3 mg/dL (ref 8.9–10.3)
Chloride: 98 mmol/L (ref 98–111)
Creatinine, Ser: 0.68 mg/dL (ref 0.61–1.24)
GFR, Estimated: >60 mL/min (ref >60)
Glucose, Bld: 102 mg/dL — ABNORMAL HIGH (ref 70–99)
Potassium: 3.6 mmol/L (ref 3.5–5.1)
Sodium: 135 mmol/L (ref 135–145)
Total Bilirubin: 0.5 mg/dL (ref 0.0–1.2)
Total Protein: 6 g/dL — ABNORMAL LOW (ref 6.5–8.1)

## 2023-10-21 LAB — CBC
HCT: 32 % — ABNORMAL LOW (ref 39.0–52.0)
Hemoglobin: 11.1 g/dL — ABNORMAL LOW (ref 13.0–17.0)
MCH: 31.4 pg (ref 26.0–34.0)
MCHC: 34.7 g/dL (ref 30.0–36.0)
MCV: 90.4 fL (ref 80.0–100.0)
Platelets: 152 10*3/uL (ref 150–400)
RBC: 3.54 MIL/uL — ABNORMAL LOW (ref 4.22–5.81)
RDW: 11.8 % (ref 11.5–15.5)
WBC: 5.2 10*3/uL (ref 4.0–10.5)
nRBC: 0 % (ref 0.0–0.2)

## 2023-10-21 LAB — OCCULT BLOOD X 1 CARD TO LAB, STOOL: Fecal Occult Bld: POSITIVE — AB

## 2023-10-21 MED ORDER — PANTOPRAZOLE SODIUM 20 MG PO TBEC: 20.0000 mg | DELAYED_RELEASE_TABLET | Freq: Every day | ORAL | 1 refills | Status: DC

## 2023-10-21 MED ORDER — PANTOPRAZOLE SODIUM 20 MG PO TBEC: 40.0000 mg | DELAYED_RELEASE_TABLET | Freq: Every day | ORAL | 0 refills | Status: AC

## 2023-10-21 NOTE — Discharge Instructions (Addendum)
 If your symptoms return, please lay flat or elevate your pelvis/legs above the level of your head.  Recommend pressing gently and slowly on the edge of the hernia to try to push it back into your abdomen.  You may also benefit from ice over the area before you try to push the hernia back in.  If the hernia is unable to be reduced or if you develop pain, overlying skin changes or other abnormality we discussed, please return to the emergency department for urgent addressing of your hernia.  Otherwise, we will send in a referral for general surgery to follow-up with to have hernia repaired.  Your stool also was positive for blood.  Suspect this is from an upper GI bleed.  Will begin a reflux medicine to help with this.  Recommend follow-up with [facility] for reassessment regarding your upper GI bleeding.

## 2023-10-21 NOTE — ED Triage Notes (Signed)
 Right groin hernia since November. It has not bothered him since yesterday. It is now triple the size and his urine is dark. No fevers

## 2023-10-21 NOTE — ED Notes (Signed)
 Pt advised unable to provide a urine sample at this time

## 2023-10-21 NOTE — ED Provider Notes (Signed)
 [place] EMERGENCY DEPARTMENT AT [facility] Provider Note   CSN: [id number] Arrival date & time: 10/21/23  1200     Patient presents with: No chief complaint on file.   Colin Stevens is a 53 y.o. male.   HPI   53 year old male presents emergency department with complaints of mass in right inguinal region.  States that he has noticed this since November but has been somewhat small.  States it typically goes away when he lies flat.  States that for the past couple of days, area has become larger.  States that upon initially talking, area is no longer present.  States he did notice 1 episode of dark stool yesterday concerned about possible bleed.  Denies any blood thinner use, chronic NSAID use.  Denies any current pain.  Has not seen surgical specialist regarding his presumed right inguinal hernia.  Denies any testicular pain, urinary symptoms, abdominal pain, fever, chills, night sweats, nausea, vomiting.  States that he currently has no symptoms.  Past medical history significant for cirrhosis of liver, hypercholesterolemia, hypertension  Prior to Admission medications   Medication Sig Start Date End Date Taking? Authorizing Provider  carvedilol  (COREG ) 12.5 MG tablet Take 1 tablet (12.5 mg total) by mouth 2 (two) times daily with a meal. 03/04/21   Love, Sharlet RAMAN, PA-C  cloNIDine  (CATAPRES ) 0.1 MG tablet Take 1 tablet (0.1 mg total) by mouth 2 (two) times daily. 03/04/21   Love, Sharlet RAMAN, PA-C  colchicine  0.6 MG tablet Take 1 tablet (0.6 mg total) by mouth daily. 03/04/21   Love, Pamela S, PA-C  cyanocobalamin  1000 MCG tablet Take 1 tablet (1,000 mcg total) by mouth daily. 03/04/21   Love, Pamela S, PA-C  diclofenac  Sodium (VOLTAREN ) 1 % GEL Apply 2 g topically 4 (four) times daily. 03/04/21   Love, Sharlet RAMAN, PA-C  folic acid  (FOLVITE ) 1 MG tablet Take 1 tablet (1 mg total) by mouth daily. 03/04/21   Love, Sharlet RAMAN, PA-C  lisinopril  (ZESTRIL ) 5 MG tablet Take 1 tablet (5 mg total)  by mouth daily. 03/05/21   Love, Sharlet RAMAN, PA-C  loperamide  (IMODIUM ) 2 MG capsule Take 1 capsule (2 mg total) by mouth every 6 (six) hours as needed for diarrhea or loose stools. 03/04/21   Love, Sharlet RAMAN, PA-C  magnesium  oxide (MAG-OX) 400 MG tablet Take 1 tablet (400 mg total) by mouth daily. 03/05/21   Love, Sharlet RAMAN, PA-C  melatonin 5 MG TABS Take 5 mg by mouth at bedtime.    [provider]  Multiple Vitamin (MULTIVITAMIN WITH MINERALS) TABS tablet Take 1 tablet by mouth daily. 03/02/21   Singh, Prashant K, MD  Naphazoline-Glycerin (REDNESS RELIEF OP) Place 1 drop into both eyes daily as needed (redness/irritation/dry eyes).    [provider]  nicotine  (NICODERM CQ  - DOSED IN MG/24 HOURS) 21 mg/24hr patch Place 1 patch (21 mg total) onto the skin daily. 03/02/21   Singh, Prashant K, MD  nystatin  (MYCOSTATIN ) 100000 UNIT/ML suspension Use as directed 5 mLs (500,000 Units total) in the mouth or throat 4 (four) times daily. 03/04/21   Love, Sharlet RAMAN, PA-C  pantoprazole  (PROTONIX ) 40 MG tablet Take 1 tablet (40 mg total) by mouth daily. 03/05/21   Love, Sharlet RAMAN, PA-C  propranolol  (INDERAL ) 20 MG tablet Take 1 tablet (20 mg total) by mouth 3 (three) times daily as needed (anxiety). 03/04/21   Love, Sharlet RAMAN, PA-C  senna-docusate (SENOKOT-S) 8.6-50 MG tablet Take 2 tablets by mouth daily at  4 PM. 03/04/21   Love, Sharlet RAMAN, PA-C  thiamine  100 MG tablet Take 1 tablet (100 mg total) by mouth daily. 03/04/21   Love, Sharlet RAMAN, PA-C    Allergies: Aleve [naproxen]    Review of Systems  All other systems reviewed and are negative.   Updated Vital Signs BP 109/69 (BP Location: Right Arm)   Pulse 83   Temp 98.1 F (36.7 C)   Resp 18   Ht 6' 1 (1.854 m)   Wt 81.6 kg   SpO2 95%   BMI 23.75 kg/m   Physical Exam Vitals and nursing note reviewed.  Constitutional:      General: He is not in acute distress.    Appearance: He is well-developed.  HENT:     Head: Normocephalic and  atraumatic.   Eyes:     Conjunctiva/sclera: Conjunctivae normal.    Cardiovascular:     Rate and Rhythm: Normal rate and regular rhythm.     Heart sounds: No murmur heard. Pulmonary:     Effort: Pulmonary effort is normal. No respiratory distress.     Breath sounds: Normal breath sounds.  Abdominal:     Palpations: Abdomen is soft.     Tenderness: There is no abdominal tenderness. There is no guarding.  Genitourinary:    Comments: No right inguinal mass with Valsalva.  No obvious tenderness in the area.  No overlying erythema, palpable fluctuance/induration.  Rectal exam showed dark brown-colored stool.  No obvious active bright red blood per rectum.  No obvious hemorrhoid present.  Musculoskeletal:        General: No swelling.     Cervical back: Neck supple.   Skin:    General: Skin is warm and dry.     Capillary Refill: Capillary refill takes less than 2 seconds.   Neurological:     Mental Status: He is alert.   Psychiatric:        Mood and Affect: Mood normal.     (all labs ordered are listed, but only abnormal results are displayed) Labs Reviewed  COMPREHENSIVE METABOLIC PANEL WITH GFR - Abnormal; Notable for the following components:      Result Value   Glucose, Bld 102 (*)    Total Protein 6.0 (*)    Alkaline Phosphatase 34 (*)    All other components within normal limits  CBC - Abnormal; Notable for the following components:   RBC 3.54 (*)    Hemoglobin 11.1 (*)    HCT 32.0 (*)    All other components within normal limits  OCCULT BLOOD X 1 CARD TO LAB, STOOL    EKG: None  Radiology: No results found.   Procedures   Medications Ordered in the ED - No data to display                                  Medical Decision Making Amount and/or Complexity of Data Reviewed Labs: ordered.  Risk Prescription drug management.   This patient presents to the ED for concern of right inguinal mass, this involves an extensive number of treatment options,  and is a complaint that carries with it a high risk of complications and morbidity.  The differential diagnosis includes hernia, abscess, cellulitis, other   Co morbidities that complicate the patient evaluation  See HPI   Additional history obtained:  Additional history obtained from EMR External records from outside source obtained and reviewed including  spittle records   Lab Tests:  I Ordered, and personally interpreted labs.  The pertinent results include: No leukocytosis.  Anemia with a hemoglobin of 11.10 which is normocytic.  No electrolyte abnormalities.  No transaminitis.  No renal dysfunction.  Occult positive   Imaging Studies ordered:  N/a   Cardiac Monitoring: / EKG:  The patient was maintained on a cardiac monitor.  I personally viewed and interpreted the cardiac monitored which showed an underlying rhythm of: sinus rhythm   Consultations Obtained:  N/a   Problem List / ED Course / Critical interventions / Medication management  Right inguinal mass Reevaluation of the patient showed that the patient stayed the same I have reviewed the patients home medicines and have made adjustments as needed   Social Determinants of Health:  Nicotine  dependence.   Test / Admission - Considered:  Right inguinal mass Vitals signs within normal range and stable throughout visit. Laboratory studies significant for: See above 53 year old male presents emergency department with complaints of mass in right inguinal region.  States that he has noticed this since November but has been somewhat small.  States it typically goes away when he lies flat.  States that for the past couple of days, area has become larger.  States that upon initially talking, area is no longer present.  States he did notice 1 episode of dark stool yesterday concerned about possible bleed.  Denies any blood thinner use, chronic NSAID use.  Denies any current pain.  Has not seen surgical specialist  regarding his presumed right inguinal hernia.  Denies any testicular pain, urinary symptoms, abdominal pain, fever, chills, night sweats, nausea, vomiting.  States that he currently has no symptoms. On exam, patient states that area has completely resolved.  No appreciable right inguinal mass even with Valsalva.  Abdomen nontender.  Suspect the patient's symptoms likely secondary to right inguinal hernia that has since reduced prior to initial evaluation.  Will recommend follow-up with general surgery and we will send in referral.  Patient also with complaints of darker colored stool.  Occult positive.  Normocytic anemia present with hemoglobin 11.1 with most recent labs 2 years ago which is lower than baseline.  Will begin PPI and recommend follow-up with GI as he already has established care.  Treatment plan discussed with patient and he acknowledged understanding was agreeable to said plan.  Patient overall well-appearing, afebrile in no acute distress. Worrisome signs and symptoms were discussed with the patient, and the patient acknowledged understanding to return to the ED if noticed. Patient was stable upon discharge.       Final diagnoses:  None    ED Discharge Orders     None          Silver Wonda LABOR, [place] 10/21/23 1458    Geraldene Hamilton, MD 10/22/23 2034

## 2023-10-30 NOTE — Progress Notes (Signed)
Forms completed and emailed to patient

## 2023-11-05 ENCOUNTER — Encounter: Payer: Self-pay | Admitting: Family Medicine

## 2023-11-06 ENCOUNTER — Ambulatory Visit
Admission: RE | Admit: 2023-11-06 | Discharge: 2023-11-06 | Disposition: A | Discharge status: Home / Self Care | Source: Ambulatory Visit | Attending: Family Medicine | Admitting: Family Medicine

## 2023-11-06 DIAGNOSIS — Z122 Encounter for screening for malignant neoplasm of respiratory organs: Secondary | ICD-10-CM

## 2023-11-07 ENCOUNTER — Ambulatory Visit: Payer: Self-pay | Admitting: General Surgery

## 2023-11-07 NOTE — Progress Notes (Signed)
 Surgical Instructions   Your procedure is scheduled on Tuesday, July 15th, 2025. Report to [facility] Main Entrance A at 1:00 P.M., then check in with the Admitting office. Any questions or running late day of surgery: call [phone]  Questions prior to your surgery date: call [phone], Monday-Friday, 8am-4pm. If you experience any cold or flu symptoms such as cough, fever, chills, shortness of breath, etc. between now and your scheduled surgery, please notify us  at the above number.     Remember:  Do not eat after midnight the night before your surgery   You may drink clear liquids until 12:00 P.M. the day of your surgery.   Clear liquids allowed are: Water, Non-Citrus Juices (without pulp), Carbonated Beverages, Clear Tea (no milk, honey, etc.), Black Coffee Only (NO MILK, CREAM OR POWDERED CREAMER of any kind), and Gatorade.    Take these medicines the morning of surgery with A SIP OF WATER: Carvedilol  (Coreg ) Pantoprazole  (Protonix ) Rosuvastatin (Crestor)   May take these medicines IF NEEDED: Acetaminophen  (Tylenol ) Colchicine   Naphazoline-Glycerin (Redness Relief) eye drop    One week prior to surgery, STOP taking any Aspirin (unless otherwise instructed by your surgeon) Aleve, Naproxen, Ibuprofen, Motrin, Advil, Goody's, BC's, all herbal medications, fish oil, and non-prescription vitamins.                     Do NOT Smoke (Tobacco/Vaping) for 24 hours prior to your procedure.  If you use a CPAP at night, you may bring your mask/headgear for your overnight stay.   You will be asked to remove any contacts, glasses, piercing's, hearing aid's, dentures/partials prior to surgery. Please bring cases for these items if needed.    Patients discharged the day of surgery will not be allowed to drive home, and someone needs to stay with them for 24 hours.  SURGICAL WAITING ROOM VISITATION Patients may have no more than 2 support people in the waiting area - these  visitors may rotate.   Pre-op nurse will coordinate an appropriate time for 1 ADULT support person, who may not rotate, to accompany patient in pre-op.  Children under the age of 54 must have an adult with them who is not the patient and must remain in the main waiting area with an adult.  If the patient needs to stay at the hospital during part of their recovery, the visitor guidelines for inpatient rooms apply.  Please refer to the [facility] website for the visitor guidelines for any additional information.   If you received a COVID test during your pre-op visit  it is requested that you wear a mask when out in public, stay away from anyone that may not be feeling well and notify your surgeon if you develop symptoms. If you have been in contact with anyone that has tested positive in the last 10 days please notify you surgeon.      Pre-operative CHG Bathing Instructions   You can play a key role in reducing the risk of infection after surgery. Your skin needs to be as free of germs as possible. You can reduce the number of germs on your skin by washing with CHG (chlorhexidine gluconate) soap before surgery. CHG is an antiseptic soap that kills germs and continues to kill germs even after washing.   DO NOT use if you have an allergy to chlorhexidine/CHG or antibacterial soaps. If your skin becomes reddened or irritated, stop using the CHG and notify one of our RNs at [phone].  TAKE A SHOWER THE NIGHT BEFORE SURGERY AND THE DAY OF SURGERY    Please keep in mind the following:  DO NOT shave, including legs and underarms, 48 hours prior to surgery.   You may shave your face before/day of surgery.  Place clean sheets on your bed the night before surgery Use a clean washcloth (not used since being washed) for each shower. DO NOT sleep with pet's night before surgery.  CHG Shower Instructions:  Wash your face and private area with normal soap. If you choose to wash your  hair, wash first with your normal shampoo.  After you use shampoo/soap, rinse your hair and body thoroughly to remove shampoo/soap residue.  Turn the water OFF and apply half the bottle of CHG soap to a CLEAN washcloth.  Apply CHG soap ONLY FROM YOUR NECK DOWN TO YOUR TOES (washing for 3-5 minutes)  DO NOT use CHG soap on face, private areas, open wounds, or sores.  Pay special attention to the area where your surgery is being performed.  If you are having back surgery, having someone wash your back for you may be helpful. Wait 2 minutes after CHG soap is applied, then you may rinse off the CHG soap.  Pat dry with a clean towel  Put on clean pajamas    Additional instructions for the day of surgery: DO NOT APPLY any lotions, deodorants, cologne, or perfumes.   Do not wear jewelry or makeup Do not wear nail polish, gel polish, artificial nails, or any other type of covering on natural nails (fingers and toes) Do not bring valuables to the hospital. [facility] is not responsible for valuables/personal belongings. Put on clean/comfortable clothes.  Please brush your teeth.  Ask your nurse before applying any prescription medications to the skin.

## 2023-11-08 ENCOUNTER — Encounter (HOSPITAL_COMMUNITY)
Admission: RE | Admit: 2023-11-08 | Discharge: 2023-11-08 | Disposition: A | Discharge status: Home / Self Care | Source: Ambulatory Visit | Attending: General Surgery | Admitting: General Surgery

## 2023-11-08 ENCOUNTER — Other Ambulatory Visit: Payer: Self-pay

## 2023-11-08 ENCOUNTER — Other Ambulatory Visit (HOSPITAL_COMMUNITY)

## 2023-11-08 ENCOUNTER — Encounter (HOSPITAL_COMMUNITY): Payer: Self-pay

## 2023-11-08 VITALS — BP 98/59 | HR 65 | Temp 98.0°F | Resp 17 | Ht 73.0 in | Wt 178.0 lb

## 2023-11-08 DIAGNOSIS — Z01810 Encounter for preprocedural cardiovascular examination: Secondary | ICD-10-CM | POA: Diagnosis present

## 2023-11-08 DIAGNOSIS — I358 Other nonrheumatic aortic valve disorders: Secondary | ICD-10-CM | POA: Diagnosis not present

## 2023-11-08 DIAGNOSIS — F17210 Nicotine dependence, cigarettes, uncomplicated: Secondary | ICD-10-CM | POA: Insufficient documentation

## 2023-11-08 DIAGNOSIS — K4090 Unilateral inguinal hernia, without obstruction or gangrene, not specified as recurrent: Secondary | ICD-10-CM | POA: Insufficient documentation

## 2023-11-08 DIAGNOSIS — I2510 Atherosclerotic heart disease of native coronary artery without angina pectoris: Secondary | ICD-10-CM | POA: Insufficient documentation

## 2023-11-08 DIAGNOSIS — E7800 Pure hypercholesterolemia, unspecified: Secondary | ICD-10-CM | POA: Insufficient documentation

## 2023-11-08 DIAGNOSIS — I110 Hypertensive heart disease with heart failure: Secondary | ICD-10-CM | POA: Diagnosis not present

## 2023-11-08 DIAGNOSIS — I5030 Unspecified diastolic (congestive) heart failure: Secondary | ICD-10-CM | POA: Diagnosis not present

## 2023-11-08 DIAGNOSIS — K7460 Unspecified cirrhosis of liver: Secondary | ICD-10-CM | POA: Diagnosis not present

## 2023-11-08 DIAGNOSIS — I493 Ventricular premature depolarization: Secondary | ICD-10-CM | POA: Insufficient documentation

## 2023-11-08 NOTE — Progress Notes (Addendum)
 PCP - Bernardino Boone, DO Cardiologist - Denies  PPM/ICD - Denies Device Orders - n/a Rep Notified - n/a  Chest x-ray - n/a - CT chest 11/06/2023 EKG - 11/08/2023 Stress Test - Denies ECHO - 02/20/2021 Cardiac Cath - Denies  Sleep Study - Denies CPAP - n/a  No DM  Last dose of GLP1 agonist- n/a GLP1 instructions: n/a  Blood Thinner Instructions: n/a Aspirin Instructions: n/a  ERAS Protcol - Clear liquids until 1200 afternoon of surgery PRE-SURGERY Ensure or G2- n/a  COVID TEST- n/a   Anesthesia review: Yes. EKG review  Patient denies shortness of breath, fever, cough and chest pain at PAT appointment. Pt denies any respiratory illness/infection in the last two months.   All instructions explained to the patient, with a verbal understanding of the material. Patient agrees to go over the instructions while at home for a better understanding. Patient also instructed to self quarantine after being tested for COVID-19. The opportunity to ask questions was provided.

## 2023-11-09 NOTE — Anesthesia Preprocedure Evaluation (Signed)
 Anesthesia Evaluation  Patient identified by MRN, date of birth, ID band Patient awake    Reviewed: Allergy & Precautions, NPO status , Patient's Chart, lab work & pertinent test results, reviewed documented beta blocker date and time   History of Anesthesia Complications Negative for: history of anesthetic complications  Airway Mallampati: II  TM Distance: >3 FB Neck ROM: Full    Dental  (+) Dental Advisory Given   Pulmonary former smoker   breath sounds clear to auscultation       Cardiovascular hypertension, Pt. on medications and Pt. on home beta blockers (-) angina  Rhythm:Regular Rate:Normal  '22 ECHO: EF 60 to 65%. 1. The LV has normal function, no regional wall motion abnormalities.   2. RVF is normal. The right ventricular size is normal.   3. The mitral valve is grossly normal. Trivial mitral valve regurgitation. No evidence of mitral stenosis.   4. The aortic valve was not well visualized. There is mild calcification of the aortic valve. Aortic valve regurgitation is not visualized. Mild aortic valve sclerosis is present, with no evidence of aortic valve stenosis.     Neuro/Psych negative neurological ROS     GI/Hepatic ,GERD  Medicated and Controlled,,(+) Cirrhosis     substance abuse  alcohol use  Endo/Other    Renal/GU      Musculoskeletal   Abdominal   Peds  Hematology Hb 13, plt 166k   Anesthesia Other Findings   Reproductive/Obstetrics                              Anesthesia Physical Anesthesia Plan  ASA: 3  Anesthesia Plan: General   Post-op Pain Management: Regional block*   Induction: Intravenous  PONV Risk Score and Plan: 2 and Ondansetron  and Dexamethasone   Airway Management Planned: Oral ETT  Additional Equipment: None  Intra-op Plan:   Post-operative Plan: Extubation in OR  Informed Consent: I have reviewed the patients History and Physical,  chart, labs and discussed the procedure including the risks, benefits and alternatives for the proposed anesthesia with the patient or authorized representative who has indicated his/her understanding and acceptance.     Dental advisory given  Plan Discussed with: CRNA and Surgeon  Anesthesia Plan Comments: (PAT note written 11/09/2023 by Mykenzi Vanzile, PA-C. Plan routine monitors, GETA with TAP block for post op analgesia )         Anesthesia Quick Evaluation

## 2023-11-09 NOTE — Progress Notes (Signed)
 Anesthesia Chart Review:  Case: [id number] Date/Time: 11/13/23 1445   Procedure: REPAIR, HERNIA, INGUINAL, ROBOT-ASSISTED, LAPAROSCOPIC, USING MESH (Right) - ROBOTIC VERSUS OPEN RIGHT INGUINAL HERNIA REPAIR WITH MESH, POSSIBLE BILATERAL - GENGERAL AND TAP BLOCK   Anesthesia type: General   Diagnosis: Inguinal hernia without obstruction or gangrene, recurrence not specified, unspecified laterality [K40.90]   Pre-op diagnosis: right inguinal hernia   Location: [facility] OR ROOM 10 / [facility] OR   Surgeons: Polly Cordella LABOR, MD       DISCUSSION: Patient is a 53 year old male scheduled for the above procedure.  History includes former smoker (quit 08/29/21), HTN, cirrhosis, hypercholesteremia.  Admission 02/19/21-03/01/21 for acute hypoxic respiratory failure, encephalopathy,  hypertensive urgency with elevated BNP (concern for acute diastolic CHF; 02/20/21 TTE showed normal BiV function, no significant valve disease, diastolic function could not be evaluated), hyponatremia, acute alcohol withdrawal with tremor/DTs in setting recent acute decrease in alcohol intake (622 oz beer + [employer] [place] alcoholic beverages daily, down to 1-2 drinks/day). Treated with IV Lasix , b-blocker, Librium , folic acid , thiamine , B12, VMI, CIWA protocol. Brain MRI unremarkable. Discharged to CIR until 03/06/21 for rehab therapies.   Per 10/24/23 Consult note by Dr. Polly, He has a history of cirrhosis and CHF, however, he reports that he quit drinking and smoking in 2022 after being admitted for a CHF exacerbation. He is currently only on hypertensive meds and reports that he is able to perform his daily activities without dyspnea or SOB. He does not follow with hepatology. LFTs in June 2025 were WNL. Echo from 2022 showed EF of 60-65%.  EKG from 11/08/23 showed SR with PVCs, similar to tracing in 2022 and had unremarkable TTE at that time.  Meds include beta-blocker, statin, ACE inhibitor.  He denied shortness of breath and chest pain at  PAT RN visit.  AST 18 and ALT 11 on 10/21/23. H/H 11.1/32.0, PLT 152. 11/06/23 chest CT LCS is in process.   Anesthesia team to evaluate on the day of surgery.    VS: BP (!) 98/59   Pulse 65   Temp 36.7 C   Resp 17   Ht 6' 1 (1.854 m)   Wt 80.7 kg   SpO2 98%   BMI 23.48 kg/m  BP Readings from Last 3 Encounters:  11/08/23 (!) 98/59  10/21/23 116/76  03/06/21 134/86     PROVIDERS: Dayna Motto, DO is PCP  Dianna Specking, MD is GI. EGD and colonoscopy in 2024 (see below).   LABS: Most recent lab results in [facility] include: Lab Results  Component Value Date   WBC 5.2 10/21/2023   HGB 11.1 (L) 10/21/2023   HCT 32.0 (L) 10/21/2023   PLT 152 10/21/2023   GLUCOSE 102 (H) 10/21/2023   ALT 11 10/21/2023   AST 18 10/21/2023   NA 135 10/21/2023   K 3.6 10/21/2023   CL 98 10/21/2023   CREATININE 0.68 10/21/2023   BUN 18 10/21/2023   CO2 26 10/21/2023   TSH 3.067 02/19/2021   INR 1.0 02/19/2021     Colonoscopy Reviewed date:03/21/2023 02:04:49 PM Interpretation:hemorrhoids, diverticulosis Performing Lab: Notes/Report: hemorrhoids, diverticulosis  EGD Reviewed date:04/04/2023 11:21:15 AM Interpretation:LA grade A reflux esophagitis with no bleeding:gastritis:normal examined duodenum Performing Lab: Notes/Report: LA grade A reflux esophagitis with no bleeding:gastritis:normal examined duodenum    IMAGES: CT Chest LCS 11/06/23: Report is in process.   EKG: EKG 11/08/23: Sinus rhythm with frequent Premature ventricular complexes Septal infarct , age undetermined Abnormal ECG - Multiple PVCs also present on 02/19/21  EKG tracing.   EKG 02/19/21: Sinus rhythm Multiple ventricular premature complexes Probable left atrial enlargement No old tracing to compare Confirmed by Lenor Hollering [phone]) on 02/19/2021 1:49:01 PM   CV: Echo 02/20/21: IMPRESSIONS   1. Left ventricular ejection fraction, by estimation, is 60 to 65%. The  left ventricle has normal function.  The left ventricle has no regional  wall motion abnormalities. Left ventricular diastolic function could not  be evaluated.   2. Right ventricular systolic function is normal. The right ventricular  size is normal. Tricuspid regurgitation signal is inadequate for assessing  PA pressure.   3. The mitral valve is grossly normal. Trivial mitral valve  regurgitation. No evidence of mitral stenosis.   4. The aortic valve was not well visualized. There is mild calcification  of the aortic valve. Aortic valve regurgitation is not visualized. Mild  aortic valve sclerosis is present, with no evidence of aortic valve  stenosis.  - Comparison(s): No prior Echocardiogram.  - Conclusion(s)/Recommendation(s): Normal biventricular function without  evidence of hemodynamically significant valvular heart disease.    Past Medical History:  Diagnosis Date   Cirrhosis of liver (HCC)    Essential hypertension 02/19/2021   Heart failure (HCC)    Hypercholesteremia    Nicotine  dependence, cigarettes, uncomplicated 02/19/2021    Past Surgical History:  Procedure Laterality Date   TONSILLECTOMY     As a child    MEDICATIONS:  acetaminophen  (TYLENOL ) 500 MG tablet   calcium  elemental as carbonate (TUMS ULTRA 1000) 400 MG chewable tablet   carvedilol  (COREG ) 6.25 MG tablet   colchicine  0.6 MG tablet   cyanocobalamin  1000 MCG tablet   lisinopril  (ZESTRIL ) 5 MG tablet   magnesium  oxide (MAG-OX) 400 MG tablet   Melatonin 12 MG TABS   Naphazoline-Glycerin  (REDNESS RELIEF OP)   pantoprazole  (PROTONIX ) 20 MG tablet   rosuvastatin  (CRESTOR ) 10 MG tablet   No current facility-administered medications for this encounter.    Isaiah Ruder, PA-C Surgical Short Stay/Anesthesiology [facility] Phone [phone] [facility] Phone [phone] 11/09/2023 8:36 PM

## 2023-11-13 ENCOUNTER — Ambulatory Visit (HOSPITAL_COMMUNITY): Admitting: Anesthesiology

## 2023-11-13 ENCOUNTER — Ambulatory Visit (HOSPITAL_COMMUNITY): Payer: Self-pay | Admitting: Vascular Surgery

## 2023-11-13 ENCOUNTER — Other Ambulatory Visit: Payer: Self-pay

## 2023-11-13 ENCOUNTER — Encounter (HOSPITAL_COMMUNITY)
Admission: RE | Disposition: A | Discharge status: Home / Self Care | Payer: Self-pay | Source: Home / Self Care | Attending: General Surgery

## 2023-11-13 ENCOUNTER — Encounter (HOSPITAL_COMMUNITY): Payer: Self-pay | Admitting: General Surgery

## 2023-11-13 ENCOUNTER — Observation Stay (HOSPITAL_COMMUNITY)
Admission: RE | Admit: 2023-11-13 | Discharge: 2023-11-14 | Disposition: A | Discharge status: Home / Self Care | Attending: General Surgery | Admitting: General Surgery

## 2023-11-13 DIAGNOSIS — Z87891 Personal history of nicotine dependence: Secondary | ICD-10-CM | POA: Insufficient documentation

## 2023-11-13 DIAGNOSIS — Z79899 Other long term (current) drug therapy: Secondary | ICD-10-CM | POA: Insufficient documentation

## 2023-11-13 DIAGNOSIS — K4030 Unilateral inguinal hernia, with obstruction, without gangrene, not specified as recurrent: Principal | ICD-10-CM | POA: Insufficient documentation

## 2023-11-13 DIAGNOSIS — I110 Hypertensive heart disease with heart failure: Secondary | ICD-10-CM | POA: Insufficient documentation

## 2023-11-13 DIAGNOSIS — I509 Heart failure, unspecified: Secondary | ICD-10-CM | POA: Insufficient documentation

## 2023-11-13 DIAGNOSIS — K4090 Unilateral inguinal hernia, without obstruction or gangrene, not specified as recurrent: Principal | ICD-10-CM

## 2023-11-13 HISTORY — PX: XI ROBOTIC ASSISTED INGUINAL HERNIA REPAIR WITH MESH: SHX6706

## 2023-11-13 LAB — CBC
HCT: 38.4 % — ABNORMAL LOW (ref 39.0–52.0)
Hemoglobin: 13 g/dL (ref 13.0–17.0)
MCH: 31.6 pg (ref 26.0–34.0)
MCHC: 33.9 g/dL (ref 30.0–36.0)
MCV: 93.2 fL (ref 80.0–100.0)
Platelets: 166 K/uL (ref 150–400)
RBC: 4.12 MIL/uL — ABNORMAL LOW (ref 4.22–5.81)
RDW: 12.3 % (ref 11.5–15.5)
WBC: 4.6 K/uL (ref 4.0–10.5)
nRBC: 0 % (ref 0.0–0.2)

## 2023-11-13 SURGERY — REPAIR, HERNIA, INGUINAL, ROBOT-ASSISTED, LAPAROSCOPIC, USING MESH
Anesthesia: General | Site: Abdomen | Laterality: Right

## 2023-11-13 MED ORDER — ONDANSETRON HCL 4 MG/2ML IJ SOLN: 4.0000 mg | Freq: Four times a day (QID) | INTRAMUSCULAR | Status: DC | PRN

## 2023-11-13 MED ORDER — ROCURONIUM BROMIDE 10 MG/ML (PF) SYRINGE
PREFILLED_SYRINGE | INTRAVENOUS | Status: DC | PRN
  Administered 2023-11-13: 10 mg via INTRAVENOUS
  Administered 2023-11-13 (×2): 20 mg via INTRAVENOUS
  Administered 2023-11-13: 10 mg via INTRAVENOUS
  Administered 2023-11-13: 60 mg via INTRAVENOUS

## 2023-11-13 MED ORDER — FENTANYL CITRATE (PF) 250 MCG/5ML IJ SOLN
INTRAMUSCULAR | Status: AC
  Filled 2023-11-13: qty 5

## 2023-11-13 MED ORDER — CELECOXIB 200 MG PO CAPS
200.0000 mg | ORAL_CAPSULE | ORAL | Status: AC
  Administered 2023-11-13: 200 mg via ORAL
  Filled 2023-11-13: qty 1

## 2023-11-13 MED ORDER — PANTOPRAZOLE SODIUM 40 MG PO TBEC
40.0000 mg | DELAYED_RELEASE_TABLET | Freq: Every day | ORAL | Status: DC
  Administered 2023-11-14: 40 mg via ORAL
  Filled 2023-11-13: qty 1

## 2023-11-13 MED ORDER — PHENYLEPHRINE HCL (PRESSORS) 10 MG/ML IV SOLN
INTRAVENOUS | Status: AC
  Filled 2023-11-13: qty 1

## 2023-11-13 MED ORDER — ROCURONIUM BROMIDE 10 MG/ML (PF) SYRINGE
PREFILLED_SYRINGE | INTRAVENOUS | Status: AC
Start: 2023-11-13 — End: 2023-11-13
  Filled 2023-11-13: qty 10

## 2023-11-13 MED ORDER — MIDAZOLAM HCL 2 MG/2ML IJ SOLN: 0.5000 mg | Freq: Once | INTRAMUSCULAR | Status: DC | PRN

## 2023-11-13 MED ORDER — SUGAMMADEX SODIUM 200 MG/2ML IV SOLN
INTRAVENOUS | Status: DC | PRN
  Administered 2023-11-13: 200 mg via INTRAVENOUS

## 2023-11-13 MED ORDER — OXYCODONE HCL 5 MG/5ML PO SOLN: 5.0000 mg | Freq: Once | ORAL | Status: DC | PRN

## 2023-11-13 MED ORDER — FENTANYL CITRATE (PF) 250 MCG/5ML IJ SOLN
INTRAMUSCULAR | Status: DC | PRN
  Administered 2023-11-13 (×3): 50 ug via INTRAVENOUS
  Administered 2023-11-13: 100 ug via INTRAVENOUS

## 2023-11-13 MED ORDER — MIDAZOLAM HCL 2 MG/2ML IJ SOLN
INTRAMUSCULAR | Status: AC
  Filled 2023-11-13: qty 2

## 2023-11-13 MED ORDER — PHENYLEPHRINE 80 MCG/ML (10ML) SYRINGE FOR IV PUSH (FOR BLOOD PRESSURE SUPPORT)
PREFILLED_SYRINGE | INTRAVENOUS | Status: AC
Start: 2023-11-13 — End: 2023-11-13
  Filled 2023-11-13: qty 10

## 2023-11-13 MED ORDER — LACTATED RINGERS IV SOLN: INTRAVENOUS | Status: DC | PRN

## 2023-11-13 MED ORDER — IBUPROFEN 200 MG PO TABS: 600.0000 mg | ORAL_TABLET | Freq: Four times a day (QID) | ORAL | 0 refills | Status: AC

## 2023-11-13 MED ORDER — HYDROMORPHONE HCL 1 MG/ML IJ SOLN: 0.2500 mg | INTRAMUSCULAR | Status: DC | PRN

## 2023-11-13 MED ORDER — PHENYLEPHRINE HCL-NACL 20-0.9 MG/250ML-% IV SOLN
INTRAVENOUS | Status: DC | PRN
  Administered 2023-11-13: 40 ug/min via INTRAVENOUS

## 2023-11-13 MED ORDER — CEFAZOLIN SODIUM-DEXTROSE 2-4 GM/100ML-% IV SOLN
2.0000 g | INTRAVENOUS | Status: AC
  Administered 2023-11-13: 2 g via INTRAVENOUS
  Filled 2023-11-13: qty 100

## 2023-11-13 MED ORDER — CHLORHEXIDINE GLUCONATE 0.12 % MT SOLN
15.0000 mL | Freq: Once | OROMUCOSAL | Status: DC
  Filled 2023-11-13: qty 15

## 2023-11-13 MED ORDER — ACETAMINOPHEN 500 MG PO TABS
1000.0000 mg | ORAL_TABLET | ORAL | Status: AC
  Administered 2023-11-13: 1000 mg via ORAL
  Filled 2023-11-13: qty 2

## 2023-11-13 MED ORDER — DEXAMETHASONE SODIUM PHOSPHATE 10 MG/ML IJ SOLN
INTRAMUSCULAR | Status: DC | PRN
  Administered 2023-11-13: 8 mg via INTRAVENOUS

## 2023-11-13 MED ORDER — PROPOFOL 10 MG/ML IV BOLUS
INTRAVENOUS | Status: DC | PRN
Start: 2023-11-13 — End: 2023-11-13
  Administered 2023-11-13: 150 mg via INTRAVENOUS
  Administered 2023-11-13: 50 mg via INTRAVENOUS

## 2023-11-13 MED ORDER — BUPIVACAINE HCL 0.25 % IJ SOLN
INTRAMUSCULAR | Status: DC | PRN
  Administered 2023-11-13: 15 mL

## 2023-11-13 MED ORDER — LISINOPRIL 2.5 MG PO TABS
2.5000 mg | ORAL_TABLET | Freq: Every day | ORAL | Status: DC
  Administered 2023-11-14: 2.5 mg via ORAL
  Filled 2023-11-13: qty 1

## 2023-11-13 MED ORDER — LACTATED RINGERS IV SOLN: INTRAVENOUS | Status: DC

## 2023-11-13 MED ORDER — BUPIVACAINE-EPINEPHRINE (PF) 0.5% -1:200000 IJ SOLN
INTRAMUSCULAR | Status: DC | PRN
  Administered 2023-11-13: 30 mL

## 2023-11-13 MED ORDER — METHOCARBAMOL 500 MG PO TABS: 500.0000 mg | ORAL_TABLET | Freq: Three times a day (TID) | ORAL | Status: DC | PRN

## 2023-11-13 MED ORDER — ONDANSETRON HCL 4 MG/2ML IJ SOLN
INTRAMUSCULAR | Status: AC
  Filled 2023-11-13: qty 2

## 2023-11-13 MED ORDER — OXYCODONE HCL 5 MG PO TABS: 5.0000 mg | ORAL_TABLET | Freq: Three times a day (TID) | ORAL | 0 refills | Status: AC | PRN

## 2023-11-13 MED ORDER — PHENYLEPHRINE HCL (PRESSORS) 10 MG/ML IV SOLN
INTRAVENOUS | Status: AC
Start: 2023-11-13 — End: 2023-11-13
  Filled 2023-11-13: qty 1

## 2023-11-13 MED ORDER — ONDANSETRON HCL 4 MG/2ML IJ SOLN
INTRAMUSCULAR | Status: DC | PRN
  Administered 2023-11-13: 4 mg via INTRAVENOUS

## 2023-11-13 MED ORDER — CHLORHEXIDINE GLUCONATE CLOTH 2 % EX PADS: 6.0000 | MEDICATED_PAD | Freq: Once | CUTANEOUS | Status: DC

## 2023-11-13 MED ORDER — AMOXICILLIN-POT CLAVULANATE 875-125 MG PO TABS
1.0000 | ORAL_TABLET | Freq: Two times a day (BID) | ORAL | Status: DC
  Administered 2023-11-13 – 2023-11-14 (×2): 1 via ORAL
  Filled 2023-11-13 (×2): qty 1

## 2023-11-13 MED ORDER — MIDAZOLAM HCL 2 MG/2ML IJ SOLN
INTRAMUSCULAR | Status: DC | PRN
  Administered 2023-11-13: 2 mg via INTRAVENOUS

## 2023-11-13 MED ORDER — GABAPENTIN 300 MG PO CAPS
300.0000 mg | ORAL_CAPSULE | ORAL | Status: AC
  Administered 2023-11-13: 300 mg via ORAL
  Filled 2023-11-13: qty 1

## 2023-11-13 MED ORDER — METHOCARBAMOL 1000 MG/10ML IJ SOLN: 500.0000 mg | Freq: Three times a day (TID) | INTRAMUSCULAR | Status: DC | PRN

## 2023-11-13 MED ORDER — ONDANSETRON 4 MG PO TBDP
4.0000 mg | ORAL_TABLET | Freq: Four times a day (QID) | ORAL | Status: DC | PRN
Start: 2023-11-13 — End: 2023-11-14

## 2023-11-13 MED ORDER — ACETAMINOPHEN 325 MG PO TABS
650.0000 mg | ORAL_TABLET | Freq: Four times a day (QID) | ORAL | 0 refills | Status: AC
Start: 2023-11-13 — End: 2023-11-19

## 2023-11-13 MED ORDER — BUPIVACAINE HCL (PF) 0.25 % IJ SOLN
INTRAMUSCULAR | Status: AC
  Filled 2023-11-13: qty 30

## 2023-11-13 MED ORDER — ACETAMINOPHEN 500 MG PO TABS
1000.0000 mg | ORAL_TABLET | Freq: Four times a day (QID) | ORAL | Status: DC
  Administered 2023-11-14 (×2): 1000 mg via ORAL
  Filled 2023-11-13 (×2): qty 2

## 2023-11-13 MED ORDER — MEPERIDINE HCL 25 MG/ML IJ SOLN: 6.2500 mg | INTRAMUSCULAR | Status: DC | PRN

## 2023-11-13 MED ORDER — ORAL CARE MOUTH RINSE: 15.0000 mL | Freq: Once | OROMUCOSAL | Status: DC

## 2023-11-13 MED ORDER — OXYCODONE HCL 5 MG PO TABS: 5.0000 mg | ORAL_TABLET | ORAL | Status: DC | PRN

## 2023-11-13 MED ORDER — ROSUVASTATIN CALCIUM 5 MG PO TABS
10.0000 mg | ORAL_TABLET | Freq: Every day | ORAL | Status: DC
  Administered 2023-11-14: 10 mg via ORAL
  Filled 2023-11-13: qty 2

## 2023-11-13 MED ORDER — MELATONIN 3 MG PO TABS
3.0000 mg | ORAL_TABLET | Freq: Every evening | ORAL | Status: DC | PRN
Start: 2023-11-13 — End: 2023-11-14

## 2023-11-13 MED ORDER — HYDROMORPHONE HCL 1 MG/ML IJ SOLN: 1.0000 mg | INTRAMUSCULAR | Status: DC | PRN

## 2023-11-13 MED ORDER — PROPOFOL 10 MG/ML IV BOLUS
INTRAVENOUS | Status: AC
  Filled 2023-11-13: qty 20

## 2023-11-13 MED ORDER — LIDOCAINE 2% (20 MG/ML) 5 ML SYRINGE
INTRAMUSCULAR | Status: DC | PRN
  Administered 2023-11-13: 50 mg via INTRAVENOUS

## 2023-11-13 MED ORDER — OXYCODONE HCL 5 MG PO TABS: 5.0000 mg | ORAL_TABLET | Freq: Once | ORAL | Status: DC | PRN

## 2023-11-13 MED ORDER — DEXAMETHASONE SODIUM PHOSPHATE 10 MG/ML IJ SOLN
INTRAMUSCULAR | Status: AC
Start: 2023-11-13 — End: 2023-11-13
  Filled 2023-11-13: qty 1

## 2023-11-13 SURGICAL SUPPLY — 48 items
BAG COUNTER SPONGE SURGICOUNT (BAG) IMPLANT
CHLORAPREP W/TINT 26 (MISCELLANEOUS) ×1 IMPLANT
COVER MAYO STAND STRL (DRAPES) ×1 IMPLANT
COVER SURGICAL LIGHT HANDLE (MISCELLANEOUS) ×1 IMPLANT
COVER TIP SHEARS 8 DVNC (MISCELLANEOUS) ×1 IMPLANT
DEFOGGER SCOPE WARM SEASHARP (MISCELLANEOUS) IMPLANT
DERMABOND ADVANCED .7 DNX12 (GAUZE/BANDAGES/DRESSINGS) ×1 IMPLANT
DRAPE ARM DVNC X/XI (DISPOSABLE) ×4 IMPLANT
DRAPE COLUMN DVNC XI (DISPOSABLE) ×1 IMPLANT
DRAPE CV SPLIT W-CLR ANES SCRN (DRAPES) ×1 IMPLANT
DRAPE SURG ORHT 6 SPLT 77X108 (DRAPES) ×1 IMPLANT
DRIVER NDL MEGA 8 DVNC XI (INSTRUMENTS) IMPLANT
DRIVER NDL MEGA SUTCUT DVNCXI (INSTRUMENTS) ×1 IMPLANT
DRIVER NDLE MEGA DVNC XI (INSTRUMENTS) ×2 IMPLANT
DRIVER NDLE MEGA SUTCUT DVNCXI (INSTRUMENTS) IMPLANT
ELECTRODE REM PT RTRN 9FT ADLT (ELECTROSURGICAL) ×1 IMPLANT
FORCEPS BPLR 8 MD DVNC XI (FORCEP) IMPLANT
FORCEPS BPLR FENES DVNC XI (FORCEP) ×1 IMPLANT
GLOVE BIO SURGEON STRL SZ7 (GLOVE) ×1 IMPLANT
GOWN STRL REUS W/ TWL LRG LVL3 (GOWN DISPOSABLE) ×2 IMPLANT
GOWN STRL REUS W/ TWL XL LVL3 (GOWN DISPOSABLE) ×2 IMPLANT
GOWN STRL REUS W/TWL 2XL LVL3 (GOWN DISPOSABLE) ×1 IMPLANT
KIT BASIN OR (CUSTOM PROCEDURE TRAY) ×1 IMPLANT
KIT TURNOVER KIT B (KITS) ×1 IMPLANT
MARKER SKIN DUAL TIP RULER LAB (MISCELLANEOUS) ×1 IMPLANT
MESH OVITEX PERM 10X17 3L (Mesh General) IMPLANT
NDL HYPO 22X1.5 SAFETY MO (MISCELLANEOUS) ×1 IMPLANT
NDL INSUFFLATION 14GA 120MM (NEEDLE) ×1 IMPLANT
NEEDLE HYPO 22X1.5 SAFETY MO (MISCELLANEOUS) ×1 IMPLANT
NEEDLE INSUFFLATION 14GA 120MM (NEEDLE) ×1 IMPLANT
OBTURATOR OPTICALSTD 8 DVNC (TROCAR) ×1 IMPLANT
PAD ARMBOARD POSITIONER FOAM (MISCELLANEOUS) ×2 IMPLANT
SCISSORS MNPLR CVD DVNC XI (INSTRUMENTS) ×1 IMPLANT
SEAL UNIV 5-12 XI (MISCELLANEOUS) ×3 IMPLANT
SET TUBE SMOKE EVAC HIGH FLOW (TUBING) ×1 IMPLANT
SPIKE FLUID TRANSFER (MISCELLANEOUS) ×1 IMPLANT
STOPCOCK 4 WAY LG BORE MALE ST (IV SETS) ×1 IMPLANT
SUT MNCRL AB 4-0 PS2 18 (SUTURE) ×1 IMPLANT
SUT SILK 3 0 SH 30 (SUTURE) IMPLANT
SUT STRATA PDS 0 15 CT-1.5 (SUTURE) IMPLANT
SUT STRATA PDS 2-0 23 CT-1 (SUTURE) IMPLANT
SUT VIC AB 3-0 SH 27X BRD (SUTURE) IMPLANT
SUT VIC AB 3-0 SH 27XBRD (SUTURE) ×1 IMPLANT
SUTURE STRATFX SPIRAL 3-0 PDS+ (SUTURE) IMPLANT
SYSTEM KII FIOS ACES ABD 5X100 (TROCAR) IMPLANT
TOWEL GREEN STERILE FF (TOWEL DISPOSABLE) ×1 IMPLANT
TRAY FOLEY SLVR 16FR LF STAT (SET/KITS/TRAYS/PACK) IMPLANT
TRAY LAPAROSCOPIC MC (CUSTOM PROCEDURE TRAY) ×1 IMPLANT

## 2023-11-13 NOTE — Anesthesia Procedure Notes (Signed)
 Procedure Name: Intubation Date/Time: 11/13/2023 2:51 PM  Performed by: Obadiah Reyes BROCKS, CRNAPre-anesthesia Checklist: Patient identified, Emergency Drugs available, Suction available and Patient being monitored Patient Re-evaluated:Patient Re-evaluated prior to induction Oxygen Delivery Method: Circle System Utilized Preoxygenation: Pre-oxygenation with 100% oxygen Induction Type: IV induction Ventilation: Mask ventilation without difficulty Laryngoscope Size: Miller and 2 Grade View: Grade I Tube type: Oral Number of attempts: 1 Airway Equipment and Method: Stylet and Oral airway Placement Confirmation: ETT inserted through vocal cords under direct vision, positive ETCO2 and breath sounds checked- equal and bilateral Secured at: 23 cm Tube secured with: Tape Dental Injury: Teeth and Oropharynx as per pre-operative assessment

## 2023-11-13 NOTE — Anesthesia Postprocedure Evaluation (Signed)
 Anesthesia Post Note  Patient: JAZE RODINO  Procedure(s) Performed: REPAIR, RIGHT HERNIA, INGUINAL, ROBOT-ASSISTED, LAPAROSCOPIC, USING MESH REPAIR OF COLONIC PERFORATION (Right: Abdomen)     Patient location during evaluation: PACU Anesthesia Type: General Level of consciousness: awake and alert, oriented and patient cooperative Pain management: pain level controlled Vital Signs Assessment: post-procedure vital signs reviewed and stable Respiratory status: spontaneous breathing, nonlabored ventilation and respiratory function stable Cardiovascular status: blood pressure returned to baseline and stable Postop Assessment: no apparent nausea or vomiting and adequate PO intake Anesthetic complications: no   No notable events documented.  Last Vitals:  Vitals:   11/13/23 1800 11/13/23 1815  BP: 123/80 125/79  Pulse: (!) 58 67  Resp: 12 15  Temp:  (!) 36.4 C  SpO2: 95% 96%    Last Pain:  Vitals:   11/13/23 1734  TempSrc:   PainSc: 0-No pain                 Kiani Wurtzel,E. Durenda Pechacek

## 2023-11-13 NOTE — H&P (Signed)
 Colin Stevens [birth year]  [id number].    HPI:  53 y/o M w/ a right inguinal hernia who presents for elective repair. He reports that he is in his usual state of health and denies any recent changes in medication.   ROS: Review of Systems  Constitutional: Negative.   HENT: Negative.    Eyes: Negative.   Respiratory: Negative.    Cardiovascular: Negative.   Gastrointestinal: Negative.   Genitourinary: Negative.   Musculoskeletal: Negative.   Skin: Negative.   Neurological: Negative.   Endo/Heme/Allergies: Negative.   Psychiatric/Behavioral: Negative.      Family History  Problem Relation Age of Onset   Heart disease Father    Cancer Maternal Grandfather     Past Medical History:  Diagnosis Date   Cirrhosis of liver (HCC)    Essential hypertension 02/19/2021   Heart failure (HCC)    Hypercholesteremia    Nicotine  dependence, cigarettes, uncomplicated 02/19/2021    Past Surgical History:  Procedure Laterality Date   TONSILLECTOMY     As a child    Social History:  reports that he quit smoking about 2 years ago. His smoking use included cigarettes. He has never used smokeless tobacco. He reports that he does not currently use alcohol after a past usage of about 77.0 standard drinks of alcohol per week. He reports that he does not use drugs.  Allergies:  Allergies  Allergen Reactions   Bupropion     Irritable    Aleve [Naproxen] Rash    Medications Prior to Admission  Medication Sig Dispense Refill   acetaminophen  (TYLENOL ) 500 MG tablet Take 500 mg by mouth every 6 (six) hours as needed for moderate pain (pain score 4-6).     calcium  elemental as carbonate (TUMS ULTRA 1000) 400 MG chewable tablet Chew 1,000 mg by mouth daily as needed for heartburn.     carvedilol  (COREG ) 6.25 MG tablet Take 6.25 mg by mouth 2 (two) times daily with a meal.     cyanocobalamin  1000 MCG tablet Take 1 tablet (1,000 mcg total) by mouth daily. 30 tablet 0   lisinopril   (ZESTRIL ) 5 MG tablet Take 1 tablet (5 mg total) by mouth daily. (Patient taking differently: Take 2.5 mg by mouth daily.) 30 tablet 0   magnesium  oxide (MAG-OX) 400 MG tablet Take 1 tablet (400 mg total) by mouth daily. 30 tablet 0   Melatonin 12 MG TABS Take 12 mg by mouth at bedtime as needed (sleep).     pantoprazole  (PROTONIX ) 20 MG tablet Take 2 tablets (40 mg total) by mouth daily. 60 tablet 0   rosuvastatin  (CRESTOR ) 10 MG tablet Take 10 mg by mouth daily.     colchicine  0.6 MG tablet Take 1 tablet (0.6 mg total) by mouth daily. (Patient taking differently: Take 0.6 mg by mouth daily as needed (gout flare).) 60 tablet 0   Naphazoline-Glycerin  (REDNESS RELIEF OP) Place 1 drop into both eyes daily as needed (redness/irritation/dry eyes). (Patient not taking: Reported on 11/13/2023)      Physical Exam: Blood pressure 112/71, pulse 61, temperature 98 F (36.7 C), temperature source Oral, resp. rate 18, height 6' 1 (1.854 m), weight 81.6 kg, SpO2 98%. Gen: male, NAD Abd: soft, non-distended, right groin marked  Results for orders placed or performed during the hospital encounter of 11/13/23 (from the past 48 hours)  CBC     Status: Abnormal   Collection Time: 11/13/23  1:35 PM  Result Value Ref Range  WBC 4.6 4.0 - 10.5 K/uL   RBC 4.12 (L) 4.22 - 5.81 MIL/uL   Hemoglobin 13.0 13.0 - 17.0 g/dL   HCT 61.5 (L) 60.9 - 47.9 %   MCV 93.2 80.0 - 100.0 fL   MCH 31.6 26.0 - 34.0 pg   MCHC 33.9 30.0 - 36.0 g/dL   RDW 87.6 88.4 - 84.4 %   Platelets 166 150 - 400 K/uL   nRBC 0.0 0.0 - 0.2 %    Comment: Performed at [facility], [address], [place], [place] [address]   No results found.  Assessment/Plan 53 y/o M w/ a right inguinal hernia  - Will proceed to the OR. - Will proceed to the OR. We discussed the alternatives and potential risks of surgery, including but not limited to: bleeding, infection, damage to bowel or surrounding structures, damage to the vas/cord, mesh  complications, chronic pain, recurrent hernia, and need for additional procedures. All questions were addressed and consent was obtained.    Cordella DELENA Polly Marlis Cheron Surgery 11/13/2023, 1:56 PM Please see Amion for pager number during day hours 7:00am-4:30pm or 7:00am -11:30am on weekends

## 2023-11-13 NOTE — Anesthesia Procedure Notes (Signed)
 Anesthesia Regional Block: TAP block   Pre-Anesthetic Checklist: , timeout performed,  Correct Patient, Correct Site, Correct Laterality,  Correct Procedure, Correct Position, site marked,  Risks and benefits discussed,  Surgical consent,  Pre-op evaluation,  At surgeon's request and post-op pain management  Laterality: Right  Prep: chloraprep       Needles:  Injection technique: Single-shot  Needle Type: Echogenic Needle     Needle Length: 9cm  Needle Gauge: 21     Additional Needles:   Procedures:,,,, ultrasound used (permanent image in chart),,    Narrative:  Start time: 11/13/2023 1:27 PM End time: 11/13/2023 1:33 PM Injection made incrementally with aspirations every 5 mL.  Performed by: Personally  Anesthesiologist: Stevens Athens, MD  Additional Notes: Pt identified in Holding room.  Monitors applied. Working IV access confirmed. Timeout, Sterile prep R subcostal abdomen.  #21ga ECHOgenic Arrow block needle into TAP with US  guidance.  30cc 0.5% Bupivacaine  1:200k epi injected incrementally after negative test dose, great spread into TAP.  Patient asymptomatic, VSS, no heme aspirated, tolerated well.   Colin Leonce, MD

## 2023-11-13 NOTE — Transfer of Care (Signed)
 Immediate Anesthesia Transfer of Care Note  Patient: Colin Stevens  Procedure(s) Performed: REPAIR, RIGHT HERNIA, INGUINAL, ROBOT-ASSISTED, LAPAROSCOPIC, USING MESH REPAIR OF COLONIC PERFORATION (Right: Abdomen)  Patient Location: PACU  Anesthesia Type:GA combined with regional for post-op pain  Level of Consciousness: drowsy  Airway & Oxygen Therapy: Patient Spontanous Breathing  Post-op Assessment: Report given to RN and Post -op Vital signs reviewed and stable  Post vital signs: Reviewed and stable  Last Vitals:  Vitals Value Taken Time  BP 141/77 11/13/23 17:33  Temp    Pulse 83 11/13/23 17:36  Resp 13 11/13/23 17:36  SpO2 99 % 11/13/23 17:36  Vitals shown include unfiled device data.  Last Pain:  Vitals:   11/13/23 1339  TempSrc:   PainSc: 1          Complications: No notable events documented.

## 2023-11-13 NOTE — Op Note (Addendum)
 Colin Stevens ([id number])  Operative Report   Date 11/13/2023  PREOPERATIVE DIAGNOSIS: Right Inguinal hernia  POSTOPERATIVE DIAGNOSIS: Incarcerated right inguinal hernia  PROCEDURE:  Robotic Right Inguinal Hernia Repair with Mesh (Bard 3D Midweight XL Right Polypropylene Mesh) Primary repair of a colonic perforation     SURGEON: Cordella Idler, MD  ANESTHESIA: General Anesthesia    INTRAOPERATIVE FINDINGS: His colon was incarcerated into the right inguinal hernia. Following reduction, I noted a focal perforation that was repaired with 2 layers of 3-0 silk. TAPP repair performed with Ovitex mesh.  IMPLANTS: Implant Name Type Inv. Item Serial No. Manufacturer Lot No. LRB No. Used Action  MESH OVITEX PERM 10X17 3L - [id number] Mesh General MESH OVITEX PERM 10X17 3L  [employer] [id number] Right 1 Implanted    ESTIMATED BLOOD LOSS: Minimal  COMPLICATIONS: None  SPECIMENS: None  OPERATIVE INDICATIONS: Pt is a 53 y.o. male who presents with a right inguinal hernia.  The patient desires definitive repair.  The procedure's risks, benefits, and alternatives were explained to the patient.  Risks, including the risks of bleeding, infection, need for mesh removal, and potential for hernia recurrence, were discussed.  The patient agreed to proceed and signed informed consent in front of a witness.   DESCRIPTION OF PROCEDURE: After preoperatively identifying the patient in holding, the patient was brought to the operating room and placed supine on the operating room table.  Both arms were tucked and padded to avoid potential nerve injury.  Sequential Compression Devices were placed bilaterally.  After induction of general anesthesia, the patient was given the appropriate perioperative antibiotics.  The abdomen was prepped and draped in a typical sterile fashion.  A JACHO approved time out, where the name of the patient, the operation, and the intended site were confirmed. The abdomen was  accessed with a Veress needle via the standard drop technique in the LUQ at Palmer's point.  Insufflation was established.  An 8 mm optical port was placed under direct visualization in the RLQ.  A camera was introduced into the abdomen, and a thorough inspection of the abdomen was performed to confirm there was no additional pathology.  Two additional 8 mm robotic ports were placed laterally under direct visualization, one superior to the umbilicus and another in the RLQ. The patient was then placed into 15-degree Trendelenburg position to facilitate examination of the bilateral inguinal spaces.  A thorough inspection of the abdomen was undertaken.  A right inguinal hernia was identified and was clearly incarcerated.   The [employer] robot was brought into the field and docked.  An 8 mm 30-degree scope was placed in the mid-abdominal port.  I used two non-traumatic graspers and external pressure from the assistant to help reduce the bowel from the hernia. There was a rush of serous fluid and the bowel initially appeared somewhat dusky but quickly pinked up. On examination, I noted a focal perforation of the colon. There was no spillage of fecal material. I performed a primary repair of the perforation using interrupted 3-0 silk and then imbricated the suture line. Given that there was no contamination noted and with ongoing concern that he may re-strangulate if the hernia was not repaired I decided to proceed with the hernia repair using biosynthetic mesh. The peritoneum was taken down 6 cm superior to the hernia from the anterior abdominal wall, and dissection was taken inferiorly towards the hernia.  Medial to the epigastric vessels, the parietal compartment was dissected and completed to visualize the  rectus muscle.  This dissection was carried down to the symphysis pubis and obturator foramen.  The retropubic space was dissected to expose at least 2 cm contralateral to the midline.  Cooper's ligament was  exposed and cleared at least 2 cm below the ligament to ensure adequate space for the inferior border of the mesh.  There was no direct or femoral hernias identified.   Lateral to the epigastric vessels, the dissection was carried out into the visceral compartment, continuing in the true preperitoneal plane.  The indirect hernia sac was carefully reduced and separated from the cord structures with medial retraction and a combination of blunt/sharp dissection and focused cautery.  This dissection continued until the cord structures were parietalized completely, allowing for continuous visualization of the reflected peritoneum with the line originating 2 cm below Cooper's medially and across the psoas muscle in the lateral compartment.  The internal ring was interrogated for a cord lipoma.  Any component of cord lipoma was reduced to the retroperitoneum and seated dorsal to the preperitoneal mesh.  Having achieved a complete dissection with a critical view of the entire myopectineal orifice, a piece of Ovitex IHR mesh was introduced into the field.  It was centered at the iliopubic tract, with the medial side crossing the midline and the inferior edge positioned 2 cm below Cooper's ligament.  With complete coverage of the myopectineal orifice, the inferior edge of the peritoneum was posterior and inferior to the mesh.  The lateral aspect of the mesh extended 3-5 cm beyond the lateral edge of the psoas.  Cephalad retraction of the peritoneal flap did not cause lifting of the inferior mesh edge or cord structures.  The mesh was fixated using an interrupted 3-0 Vicryl suture placed to the ipsilateral Coopers ligament.  The peritoneal flap was closed with a running 3-0 Stratafix barbed suture. Hemostasis was assured in the entirety of the abdomen.  The two lateral ports were removed under direct visualization.  The abdomen was desufflated under direct visualization.  The port sites were closed, local anesthetic was  infiltrated, and Dermabond was applied.  After confirming twice that the sponge, needle, and instrument counts were correct, the procedure was terminated, the patient was extubated and transferred to the recovery room in stable condition.     DISPOSITION: Stable to PACU.   Electronically Signed By: Cordella DELENA Idler 11/13/2023 5:22 PM

## 2023-11-14 ENCOUNTER — Encounter (HOSPITAL_COMMUNITY): Payer: Self-pay | Admitting: General Surgery

## 2023-11-14 DIAGNOSIS — K4030 Unilateral inguinal hernia, with obstruction, without gangrene, not specified as recurrent: Secondary | ICD-10-CM | POA: Diagnosis not present

## 2023-11-14 MED ORDER — AMOXICILLIN-POT CLAVULANATE 875-125 MG PO TABS: 1.0000 | ORAL_TABLET | Freq: Two times a day (BID) | ORAL | 0 refills | Status: AC

## 2023-11-14 MED ORDER — NAPHAZOLINE-GLYCERIN 0.012-0.25 % OP SOLN
1.0000 [drp] | Freq: Four times a day (QID) | OPHTHALMIC | Status: DC
  Administered 2023-11-14: 2 [drp] via OPHTHALMIC
  Administered 2023-11-14: 1 [drp] via OPHTHALMIC
  Filled 2023-11-14: qty 15

## 2023-11-14 NOTE — TOC Transition Note (Signed)
 Transition of Care [facility]) - Discharge Note   Patient Details  Name: Colin Stevens MRN: [id number] Date of Birth: Nov 17, 1970  [facility] CM/SW Contact:  Tom-Johnson, Harvest Muskrat, RN Phone Number: 11/14/2023, 12:51 PM   Clinical Narrative:     Patient is scheduled for discharge today.  Hospital f/u and discharge instructions on AVS. TOC needs or recommendations noted. Wife, Veneda to transport at discharge.  No further TOC needs noted.       Final next level of care: Home/Self Care Barriers to Discharge: Barriers Resolved   Patient Goals and CMS Choice Patient states their goals for this hospitalization and ongoing recovery are:: To return home CMS Medicare.gov Compare Post Acute Care list provided to:: Patient Choice offered to / list presented to : NA      Discharge Placement                Patient to be transferred to facility by: Wige Name of family member notified: [facility]    Discharge Plan and Services Additional resources added to the After Visit Summary for                  DME Arranged: N/A DME Agency: NA       HH Arranged: NA HH Agency: NA        Social Drivers of Health (SDOH) Interventions SDOH Screenings   Housing: Unknown (10/24/2023)   Received from [facility]  Tobacco Use: Medium Risk (11/13/2023)     Readmission Risk Interventions     No data to display

## 2023-11-14 NOTE — Discharge Summary (Signed)
 Physician Discharge Summary  Patient ID: Colin Stevens MRN: [id number] DOB/AGE: 07/23/1970 53 y.o.  Admit date: 11/13/2023 Discharge date: 11/14/2023  Admission Diagnoses:  Discharge Diagnoses:  Principal Problem:   Right inguinal hernia   Discharged Condition: stable  Hospital Course: Colin Stevens is a 53 y/o M who presented for elective repair of a right inguinal hernia. He noted that the bulge had become irreducible earlier in the day and at the time of surgery his hernia was found to be incarcerated. There was a focal perforation that was repaired primarily. He then underwent an uneventful robotic right inguinal hernia repair. Given the findings in the OR, he was kept overnight for observation. His WBC was normal and he did not have any fevers or pain. He discharged to home on POD 1. At the time of discharge he was ambulating at baseline, his pain was well controlled, and he was tolerating PO without abdominal pain or nausea. He will complete 5 days of antibiotics  Discharge Exam: Blood pressure 108/60, pulse 89, temperature 98.2 F (36.8 C), temperature source Oral, resp. rate 16, height 6' 1 (1.854 m), weight 81.6 kg, SpO2 96%. Gen: male, NAD Abd: soft, non-distended, non-tender, port sites clean and dry without drainage or erythema Groin: no swelling, normal anatomy  Disposition: Discharge disposition: 01-Home or Self Care        Allergies as of 11/14/2023       Reactions   Bupropion    Irritable    Aleve [naproxen] Rash        Medication List     STOP taking these medications    REDNESS RELIEF OP       TAKE these medications    acetaminophen  500 MG tablet Commonly known as: TYLENOL  Take 500 mg by mouth every 6 (six) hours as needed for moderate pain (pain score 4-6). What changed: Another medication with the same name was added. Make sure you understand how and when to take each.   acetaminophen  325 MG tablet Commonly known as: Tylenol  Take 2 tablets  (650 mg total) by mouth every 6 (six) hours for 6 days. What changed: You were already taking a medication with the same name, and this prescription was added. Make sure you understand how and when to take each.   amoxicillin -clavulanate 875-125 MG tablet Commonly known as: AUGMENTIN  Take 1 tablet by mouth every 12 (twelve) hours for 5 days.   carvedilol  6.25 MG tablet Commonly known as: COREG  Take 6.25 mg by mouth 2 (two) times daily with a meal.   colchicine  0.6 MG tablet Take 1 tablet (0.6 mg total) by mouth daily. What changed:  when to take this reasons to take this   cyanocobalamin  1000 MCG tablet Commonly known as: VITAMIN B12 Take 1 tablet (1,000 mcg total) by mouth daily.   ibuprofen  200 MG tablet Commonly known as: Motrin  IB Take 3 tablets (600 mg total) by mouth every 6 (six) hours for 6 days.   lisinopril  5 MG tablet Commonly known as: ZESTRIL  Take 1 tablet (5 mg total) by mouth daily. What changed: how much to take   magnesium  oxide 400 MG tablet Commonly known as: MAG-OX Take 1 tablet (400 mg total) by mouth daily.   Melatonin 12 MG Tabs Take 12 mg by mouth at bedtime as needed (sleep).   oxyCODONE  5 MG immediate release tablet Commonly known as: Roxicodone  Take 1 tablet (5 mg total) by mouth every 8 (eight) hours as needed for up to 4 days.  pantoprazole  20 MG tablet Commonly known as: PROTONIX  Take 2 tablets (40 mg total) by mouth daily.   rosuvastatin  10 MG tablet Commonly known as: CRESTOR  Take 10 mg by mouth daily.   Tums Ultra 1000 1000 MG chewable tablet Generic drug: calcium  elemental as carbonate Chew 1,000 mg by mouth daily as needed for heartburn.         Signed: Cordella DELENA Idler 11/14/2023, 12:28 PM
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
## 2023-11-14 NOTE — Discharge Instructions (Signed)

## 2023-11-14 NOTE — Plan of Care (Signed)
  Problem: Education: Goal: Knowledge of General Education information will improve Description: Including pain rating scale, medication(s)/side effects and non-pharmacologic comfort measures Outcome: Adequate for Discharge   Problem: Health Behavior/Discharge Planning: Goal: Ability to manage health-related needs will improve Outcome: Adequate for Discharge   Problem: Clinical Measurements: Goal: Ability to maintain clinical measurements within normal limits will improve Outcome: Adequate for Discharge Goal: Will remain free from infection Outcome: Adequate for Discharge Goal: Diagnostic test results will improve Outcome: Adequate for Discharge Goal: Respiratory complications will improve Outcome: Adequate for Discharge Goal: Cardiovascular complication will be avoided Outcome: Adequate for Discharge   Problem: Activity: Goal: Risk for activity intolerance will decrease Outcome: Adequate for Discharge   Problem: Nutrition: Goal: Adequate nutrition will be maintained Outcome: Adequate for Discharge   Problem: Coping: Goal: Level of anxiety will decrease Outcome: Adequate for Discharge   Problem: Elimination: Goal: Will not experience complications related to bowel motility Outcome: Adequate for Discharge Goal: Will not experience complications related to urinary retention Outcome: Adequate for Discharge   Problem: Pain Managment: Goal: General experience of comfort will improve and/or be controlled Outcome: Adequate for Discharge   Problem: Safety: Goal: Ability to remain free from injury will improve Outcome: Adequate for Discharge   Problem: Skin Integrity: Goal: Risk for impaired skin integrity will decrease Outcome: Adequate for Discharge   Problem: Safety: Goal: Ability to remain free from injury will improve Outcome: Adequate for Discharge

## 2023-11-14 NOTE — Plan of Care (Signed)
  Problem: Clinical Measurements: Goal: Respiratory complications will improve Outcome: Progressing   Problem: Clinical Measurements: Goal: Cardiovascular complication will be avoided Outcome: Progressing   Problem: Nutrition: Goal: Adequate nutrition will be maintained Outcome: Progressing   Problem: Coping: Goal: Level of anxiety will decrease Outcome: Progressing   Problem: Elimination: Goal: Will not experience complications related to bowel motility Outcome: Progressing   Problem: Elimination: Goal: Will not experience complications related to urinary retention Outcome: Progressing   Problem: Pain Managment: Goal: General experience of comfort will improve and/or be controlled Outcome: Progressing   Problem: Safety: Goal: Ability to remain free from injury will improve Outcome: Progressing

## 2023-11-14 NOTE — Progress Notes (Signed)
 Explained discharge instructions to patient. Reviewed follow up appointment and next medication administration times. Also reviewed education. Patient verbalized having an understanding for instructions given. All belongings are in the patient's possession. IV was removed. No other needs verbalized. [facility] will transport downstairs for discharge.
